# Patient Record
Sex: Female | Born: 1950 | Race: Black or African American | Hispanic: No | Marital: Single | Smoking: Former smoker
Health system: Southern US, Community
[De-identification: ages and names within clinical notes are randomized; demographics above are authoritative.]

## PROBLEM LIST (undated history)

## (undated) DIAGNOSIS — I10 Essential (primary) hypertension: Secondary | ICD-10-CM

## (undated) DIAGNOSIS — M1990 Unspecified osteoarthritis, unspecified site: Secondary | ICD-10-CM

## (undated) DIAGNOSIS — M109 Gout, unspecified: Secondary | ICD-10-CM

## (undated) DIAGNOSIS — R0600 Dyspnea, unspecified: Secondary | ICD-10-CM

## (undated) DIAGNOSIS — E119 Type 2 diabetes mellitus without complications: Secondary | ICD-10-CM

## (undated) DIAGNOSIS — I200 Unstable angina: Secondary | ICD-10-CM

## (undated) DIAGNOSIS — R011 Cardiac murmur, unspecified: Secondary | ICD-10-CM

## (undated) DIAGNOSIS — G5600 Carpal tunnel syndrome, unspecified upper limb: Secondary | ICD-10-CM

## (undated) HISTORY — PX: TUBAL LIGATION: SHX77

## (undated) HISTORY — PX: APPENDECTOMY: SHX54

---

## 2004-09-09 ENCOUNTER — Ambulatory Visit: Payer: Self-pay | Admitting: Obstetrics & Gynecology

## 2004-09-17 ENCOUNTER — Ambulatory Visit (HOSPITAL_COMMUNITY): Admission: RE | Admit: 2004-09-17 | Discharge: 2004-09-17 | Payer: Self-pay | Admitting: *Deleted

## 2004-10-21 ENCOUNTER — Ambulatory Visit (HOSPITAL_COMMUNITY): Admission: RE | Admit: 2004-10-21 | Discharge: 2004-10-21 | Payer: Self-pay | Admitting: *Deleted

## 2004-10-21 ENCOUNTER — Ambulatory Visit: Payer: Self-pay | Admitting: Obstetrics and Gynecology

## 2004-12-15 ENCOUNTER — Ambulatory Visit: Payer: Self-pay | Admitting: Obstetrics and Gynecology

## 2004-12-15 ENCOUNTER — Ambulatory Visit (HOSPITAL_COMMUNITY): Admission: RE | Admit: 2004-12-15 | Discharge: 2004-12-15 | Payer: Self-pay | Admitting: Obstetrics and Gynecology

## 2004-12-15 ENCOUNTER — Encounter (INDEPENDENT_AMBULATORY_CARE_PROVIDER_SITE_OTHER): Payer: Self-pay | Admitting: *Deleted

## 2005-01-01 ENCOUNTER — Ambulatory Visit: Payer: Self-pay | Admitting: Obstetrics and Gynecology

## 2009-02-05 ENCOUNTER — Emergency Department (HOSPITAL_COMMUNITY): Admission: EM | Admit: 2009-02-05 | Discharge: 2009-02-05 | Payer: Self-pay | Admitting: Family Medicine

## 2009-03-31 ENCOUNTER — Emergency Department (HOSPITAL_COMMUNITY): Admission: EM | Admit: 2009-03-31 | Discharge: 2009-03-31 | Payer: Self-pay | Admitting: Family Medicine

## 2009-04-22 ENCOUNTER — Ambulatory Visit: Payer: Self-pay | Admitting: Family Medicine

## 2009-04-22 DIAGNOSIS — K044 Acute apical periodontitis of pulpal origin: Secondary | ICD-10-CM | POA: Insufficient documentation

## 2009-04-22 DIAGNOSIS — E669 Obesity, unspecified: Secondary | ICD-10-CM | POA: Insufficient documentation

## 2009-04-22 DIAGNOSIS — I10 Essential (primary) hypertension: Secondary | ICD-10-CM | POA: Insufficient documentation

## 2009-04-22 DIAGNOSIS — M1990 Unspecified osteoarthritis, unspecified site: Secondary | ICD-10-CM

## 2009-04-22 DIAGNOSIS — E119 Type 2 diabetes mellitus without complications: Secondary | ICD-10-CM

## 2009-04-22 LAB — CONVERTED CEMR LAB
BUN: 9 mg/dL (ref 6–23)
Basophils Absolute: 0 10*3/uL (ref 0.0–0.1)
Basophils Relative: 0 % (ref 0–1)
Bilirubin Urine: NEGATIVE
Blood in Urine, dipstick: NEGATIVE
Calcium: 9.4 mg/dL (ref 8.4–10.5)
Cholesterol: 208 mg/dL — ABNORMAL HIGH (ref 0–200)
Eosinophils Relative: 1 % (ref 0–5)
Glucose, Urine, Semiquant: NEGATIVE
Hgb A1c MFr Bld: 6.5 %
Lymphocytes Relative: 33 % (ref 12–46)
Monocytes Relative: 8 % (ref 3–12)
Potassium: 4.5 meq/L (ref 3.5–5.3)
Protein, U semiquant: NEGATIVE
RBC: 4.28 M/uL (ref 3.87–5.11)
RDW: 14.6 % (ref 11.5–15.5)
Rapid HIV Screen: NEGATIVE
Sodium: 142 meq/L (ref 135–145)
Specific Gravity, Urine: 1.015
Total CHOL/HDL Ratio: 4.1
Triglycerides: 120 mg/dL (ref <150)
Urobilinogen, UA: 0.2
VLDL: 24 mg/dL (ref 0–40)
WBC: 8 10*3/uL (ref 4.0–10.5)

## 2009-04-23 ENCOUNTER — Encounter (INDEPENDENT_AMBULATORY_CARE_PROVIDER_SITE_OTHER): Payer: Self-pay | Admitting: Family Medicine

## 2009-04-29 ENCOUNTER — Encounter (INDEPENDENT_AMBULATORY_CARE_PROVIDER_SITE_OTHER): Payer: Self-pay | Admitting: Family Medicine

## 2009-05-06 ENCOUNTER — Ambulatory Visit: Payer: Self-pay | Admitting: Family Medicine

## 2009-05-06 DIAGNOSIS — E559 Vitamin D deficiency, unspecified: Secondary | ICD-10-CM

## 2009-05-06 DIAGNOSIS — R011 Cardiac murmur, unspecified: Secondary | ICD-10-CM

## 2009-05-06 LAB — CONVERTED CEMR LAB
Bilirubin Urine: NEGATIVE
Blood in Urine, dipstick: NEGATIVE
Glucose, Urine, Semiquant: NEGATIVE
Microalb, Ur: 1 mg/dL (ref 0.00–1.89)
Specific Gravity, Urine: 1.02
Urobilinogen, UA: 0.2
pH: 5

## 2009-05-07 ENCOUNTER — Encounter (INDEPENDENT_AMBULATORY_CARE_PROVIDER_SITE_OTHER): Payer: Self-pay | Admitting: Family Medicine

## 2009-05-20 ENCOUNTER — Ambulatory Visit: Payer: Self-pay | Admitting: Family Medicine

## 2009-05-20 DIAGNOSIS — M109 Gout, unspecified: Secondary | ICD-10-CM | POA: Insufficient documentation

## 2009-05-20 DIAGNOSIS — N841 Polyp of cervix uteri: Secondary | ICD-10-CM | POA: Insufficient documentation

## 2009-05-21 ENCOUNTER — Encounter (INDEPENDENT_AMBULATORY_CARE_PROVIDER_SITE_OTHER): Payer: Self-pay | Admitting: Family Medicine

## 2009-10-23 ENCOUNTER — Emergency Department (HOSPITAL_COMMUNITY): Admission: EM | Admit: 2009-10-23 | Discharge: 2009-10-23 | Payer: Self-pay | Admitting: Family Medicine

## 2009-12-31 ENCOUNTER — Ambulatory Visit: Payer: Self-pay | Admitting: Internal Medicine

## 2010-07-06 LAB — CONVERTED CEMR LAB
Blood Glucose, Fingerstick: 180
CO2: 21 meq/L (ref 19–32)
Glucose, Urine, Semiquant: NEGATIVE
Sodium: 139 meq/L (ref 135–145)
Urobilinogen, UA: 0.2
pH: 5

## 2010-07-09 NOTE — Letter (Signed)
Summary: TEST ORDER FORM//T-ECHO TRANSTHORACIC//APPT DATE & TIME  TEST ORDER FORM//T-ECHO TRANSTHORACIC//APPT DATE & TIME   Imported By: Arta Bruce 07/05/2009 11:03:22  _____________________________________________________________________  External Attachment:    Type:   Image     Comment:   External Document

## 2010-07-09 NOTE — Letter (Signed)
Summary: DENTAL REFERRAL  DENTAL REFERRAL   Imported By: Arta Bruce 06/20/2009 15:51:59  _____________________________________________________________________  External Attachment:    Type:   Image     Comment:   External Document

## 2010-07-09 NOTE — Letter (Signed)
Summary: GYNECOLOGIC CYTOLOGY REPORT  GYNECOLOGIC CYTOLOGY REPORT   Imported By: Arta Bruce 07/22/2009 16:56:13  _____________________________________________________________________  External Attachment:    Type:   Image     Comment:   External Document

## 2010-10-24 NOTE — Group Therapy Note (Signed)
NAME:  Desiree Sanchez, Desiree Sanchez NO.:  [id number]   MEDICAL RECORD NO.:  [id number]          PATIENT TYPE:  WOC   LOCATION:  [facility]                   FACILITY:  [facility]   PHYSICIAN:  Argentina Donovan, MD        DATE OF BIRTH:  August 05, 1950   DATE OF SERVICE:  09/09/2004                                    CLINIC NOTE   CHIEF COMPLAINT:  New patient for Pap smear as well as postmenopausal  bleeding and vaginal discharge.   HISTORY OF PRESENT ILLNESS:  The patient reports that she went through  menopause in approximately 1995 and has had no episodes of bleeding since.  However, she had what she describes as a menstrual period in December of  2005 that lasted for approximately five days.  She also noted some tissue  down in her vaginal introitus at that time but that resolved upon resolution  of the vaginal bleeding.  She also notes that she has occasional vaginal  odor, especially after this episode of bleeding.  She uses some Norform  suppositories and these help.  She is not currently sexually active.   PAST MEDICAL HISTORY:  Heart disease.   PAST MENSTRUAL HISTORY:  She began her menarche at age 46.  She did have  heavy periods.  She went through menopause in 1995 and had this episode of  menstrual bleeding in December of 2005.   PAST OBSTETRICAL HISTORY:  She is G4, P3.  Her last Pap smear was in 2004.  She has never had an abnormal Pap smear.  Her mammogram was in 2000.   PAST SURGICAL HISTORY:  She had an appendectomy in 1965.   FAMILY HISTORY:  Significant for mother and sister with diabetes type 2 and  myocardial infarction in mother.   SOCIAL HISTORY:  She lives alone.  Denies tobacco, alcohol, and drugs.   REVIEW OF SYSTEMS:  Negative for bruising.  Positive for numbness in  fingers.  Negative for swelling, muscle aches, fevers, fatigue, weight loss,  weight gain.  Negative for headaches.  Negative for dizzy spells.  Negative  for problems with hearing, smell, or  vision.  Negative for hemoptysis.  Negative for shortness of breath or chest pain.  Negative for nausea,  vomiting, hematochezia, dysuria.  Negative for urinary incontinence.  Negative for hot flashes.  Positive for vaginal odor and vaginal bleeding.  Negative for vaginal itching or pain with intercourse.   PHYSICAL EXAMINATION:  VITAL SIGNS:  Temperature 97.0, pulse 61, blood  pressure 149/86, weight today is 255.4.  GENERAL:  This is an obese African-American female in no acute distress.  CARDIOVASCULAR:  Regular rate and rhythm.  No murmurs, rubs, or gallops  noted.  LUNGS:  Clear to auscultation bilaterally.  ABDOMEN:  Obese, soft, nontender, nondistended with normoactive bowel  sounds.  PELVIC:  She has normal external female genitalia.  She does appear to have  some early changes of atrophic vaginitis.  Her cervix is without lesion,  although there is a small polypoid type protrusion from the external os.  There is no discharge from the external os.  A wet prep is collected as well  as a Pap smear.  Bimanual examination shows no cervical motion tenderness,  no adnexal tenderness or enlargement.   ASSESSMENT/PLAN:  1.  Postmenopausal bleeding.  The cause of this bleeding is unclear.  She      has only had one episode but coupled with the tissue that she noted      during the bleeding as well as the tissue noted on her speculum      examination, seems prudent to further evaluate this.  At this time we      will follow up on the Pap smear and also obtain an ultrasound to assess      her endometrial stripe.  The patient will follow up in one month's time      for follow-up of her postmenopausal bleeding.  2.  Health care maintenance.  The patient will also be scheduled for a      mammogram on April 12.  She is agreeable to this plan of care and will      return to the clinic in one month's time.      JT/MEDQ  D:  09/09/2004  T:  09/10/2004  Job:  [id number]

## 2010-10-24 NOTE — Op Note (Signed)
NAME:  Desiree Sanchez, Desiree Sanchez               ACCOUNT NO.:  [id number]   MEDICAL RECORD NO.:  [id number]          PATIENT TYPE:  AMB   LOCATION:  [facility]                           FACILITY:  [facility]   PHYSICIAN:  Phil D. Okey Dupre, M.D.     DATE OF BIRTH:  1950/06/23   DATE OF PROCEDURE:  12/15/2004  DATE OF DISCHARGE:                                 OPERATIVE REPORT   PROCEDURE:  Dilatation, curettage and uterine polypectomy.   PREOPERATIVE DIAGNOSIS:  Postmenopausal bleeding with uterine polyp.   POSTOPERATIVE DIAGNOSIS:  Postmenopausal bleeding with uterine polyp.   SURGEON:  Javier Glazier. Okey Dupre, M.D.   ANESTHESIA:  MAC plus local.   ESTIMATED BLOOD LOSS:  Less than 10 mL.   POSTOPERATIVE CONDITION:  Satisfactory.   SPECIMENS TO PATHOLOGY:  Uterine polyp plus uterine curetting's.   DESCRIPTION OF PROCEDURE:  Under satisfactory MAC analgesia with the patient  in the dorsal lithotomy position, the perineum and vagina was prepped and  draped in the usual sterile manner. Bimanual pelvic anesthesia under  anesthesia revealed the uterus upper limits of normal and first degree  retroversion, freely moveable with normal free adnexa, a cervix that was in  first degree prolapse with __________ that was clean and well rugated.  External genitalia was normal. A weighted speculum was placed in the  posterior fourchette of the vagina, anterior lip of the cervix grasped with  a single tooth tenaculum. The uterine cavity sounded to a depth of 8 cm, the  cervical os dilated to a #8 Hegar dilator. A cervical polyp that was easily  seen at the external cervical os was twisted off with polyp forceps and the  base curetted with a small serrated curette. The uterine cavity was then  curetted with a small serrated curette followed by a small smooth curette  and the uterine cavity had very little endometrium in the cavity obtainable.  The specimen was sent for pathological diagnosis. Prior to this, the  paracervical areas were  injected each side at 8 and 4 o'clock with 10 mL  each of 1% Xylocaine to give the patient additional comfort. The tenaculum  and speculum removed from the vagina and the patient was transferred to the  recovery room in satisfactory condition having tolerated the procedure well.       PDR/MEDQ  D:  12/15/2004  T:  12/15/2004  Job:  [id number]

## 2012-03-21 ENCOUNTER — Other Ambulatory Visit (HOSPITAL_COMMUNITY): Payer: Self-pay | Admitting: Internal Medicine

## 2012-03-21 DIAGNOSIS — Z1231 Encounter for screening mammogram for malignant neoplasm of breast: Secondary | ICD-10-CM

## 2012-04-25 ENCOUNTER — Ambulatory Visit (HOSPITAL_COMMUNITY): Payer: Self-pay

## 2012-05-12 ENCOUNTER — Ambulatory Visit (HOSPITAL_COMMUNITY)
Admission: RE | Admit: 2012-05-12 | Discharge: 2012-05-12 | Disposition: A | Discharge status: Home / Self Care | Payer: Self-pay | Source: Ambulatory Visit | Attending: Internal Medicine | Admitting: Internal Medicine

## 2012-05-12 DIAGNOSIS — Z1231 Encounter for screening mammogram for malignant neoplasm of breast: Secondary | ICD-10-CM | POA: Insufficient documentation

## 2012-06-02 ENCOUNTER — Emergency Department (HOSPITAL_COMMUNITY)
Admission: EM | Admit: 2012-06-02 | Discharge: 2012-06-02 | Disposition: A | Discharge status: Home / Self Care | Payer: Self-pay | Attending: Emergency Medicine | Admitting: Emergency Medicine

## 2012-06-02 ENCOUNTER — Encounter (HOSPITAL_COMMUNITY): Payer: Self-pay | Admitting: Emergency Medicine

## 2012-06-02 DIAGNOSIS — M1990 Unspecified osteoarthritis, unspecified site: Secondary | ICD-10-CM

## 2012-06-02 DIAGNOSIS — I10 Essential (primary) hypertension: Secondary | ICD-10-CM | POA: Insufficient documentation

## 2012-06-02 DIAGNOSIS — Z87891 Personal history of nicotine dependence: Secondary | ICD-10-CM | POA: Insufficient documentation

## 2012-06-02 DIAGNOSIS — Z79899 Other long term (current) drug therapy: Secondary | ICD-10-CM | POA: Insufficient documentation

## 2012-06-02 DIAGNOSIS — M129 Arthropathy, unspecified: Secondary | ICD-10-CM | POA: Insufficient documentation

## 2012-06-02 DIAGNOSIS — E119 Type 2 diabetes mellitus without complications: Secondary | ICD-10-CM | POA: Insufficient documentation

## 2012-06-02 HISTORY — DX: Type 2 diabetes mellitus without complications: E11.9

## 2012-06-02 HISTORY — DX: Essential (primary) hypertension: I10

## 2012-06-02 HISTORY — DX: Unspecified osteoarthritis, unspecified site: M19.90

## 2012-06-02 MED ORDER — TRAMADOL HCL 50 MG PO TABS: 50.0000 mg | ORAL_TABLET | Freq: Four times a day (QID) | ORAL | Status: DC | PRN

## 2012-06-02 NOTE — ED Notes (Signed)
PJ  With case management gave pt flyer with info regarding [facility] clinic hours at [facility].

## 2012-06-02 NOTE — ED Notes (Signed)
Pt c/o bilateral knee and leg pain. Pt states she has arthritis and she was being seen at [facility] and she has been unable to get her medication refilled since they closed down. Rates pain 9/10.

## 2012-06-02 NOTE — ED Provider Notes (Signed)
History  This chart was scribed for Loren Racer, MD by Bennett Scrape, ED Scribe. This patient was seen in room TR07C/TR07C and the patient's care was started at 11:05 AM.  CSN: [id number]  Arrival date & time 06/02/12  1010   First MD Initiated Contact with Patient 06/02/12 1105      Chief Complaint  Patient presents with  . Leg Pain    Patient is a 61 y.o. female presenting with leg pain. The history is provided by the patient. No language interpreter was used.  Leg Pain  Incident onset: chronic, arthritis. There was no injury mechanism. The pain is present in the left hip, right hip, right knee and left knee. The pain is at a severity of 9/10. The pain has been constant since onset. The symptoms are aggravated by activity. She has tried acetaminophen for the symptoms. The treatment provided no relief.    Annamay Laymon is a 61 y.o. female with a h/o arthritis who presents to the Emergency Department complaining of bilateral knee and hip pain that she attributes to arthritis. She states that she was being seen at [facility] and has been unable to get her Tramadol refilled since they closed. She denies being on antiinflammatories currently. She reports that acetaminophen does not improve her pain and ibuprofen makes her nauseous. She states that her insurance expired and she has been unable to find a PCP. She denies having any other symptoms or illnesses currently. She has a h/o HTN and DM. Shet is a former smoker but denies alcohol use.  Past Medical History  Diagnosis Date  . Hypertension   . Diabetes mellitus without complication   . Arthritis     Past Surgical History  Procedure Date  . Appendectomy     History reviewed. No pertinent family history.  History  Substance Use Topics  . Smoking status: Former Games developer  . Smokeless tobacco: Never Used  . Alcohol Use: No    No OB history provided.  Review of Systems  Constitutional: Negative for fever and chills.    HENT: Negative for congestion and sore throat.   Musculoskeletal: Positive for arthralgias. Negative for back pain.       Positive for chronic bilateral hip pain and knee pain  All other systems reviewed and are negative.    Allergies  Lisinopril  Home Medications   Current Outpatient Rx  Name  Route  Sig  Dispense  Refill  . ATENOLOL 25 MG PO TABS   Oral   Take 25 mg by mouth daily.         Marland Kitchen HYDROCHLOROTHIAZIDE 25 MG PO TABS   Oral   Take 25 mg by mouth daily.         Marland Kitchen METFORMIN HCL 500 MG PO TABS   Oral   Take 500 mg by mouth 2 (two) times daily with a meal.         . TRAMADOL HCL 50 MG PO TABS   Oral   Take 1 tablet (50 mg total) by mouth every 6 (six) hours as needed for pain.   30 tablet   0     Triage Vitals: BP 165/85  Pulse 61  Temp 98 F (36.7 C) (Oral)  Resp 18  SpO2 100%  Physical Exam  Nursing note and vitals reviewed. Constitutional: She is oriented to person, place, and time. She appears well-developed and well-nourished. No distress.  HENT:  Head: Normocephalic and atraumatic.  Eyes: EOM are normal.  Neck:  Neck supple. No tracheal deviation present.  Cardiovascular: Normal rate.   Pulmonary/Chest: Effort normal. No respiratory distress.  Musculoskeletal: Normal range of motion.       Full ROM in bilateral hips and knees, pulses are intact bilaterally, ambulatory  Neurological: She is alert and oriented to person, place, and time.  Skin: Skin is warm and dry.  Psychiatric: She has a normal mood and affect. Her behavior is normal.    ED Course  Procedures (including critical care time)  DIAGNOSTIC STUDIES: Oxygen Saturation is 100% on room air, normal by my interpretation.    COORDINATION OF CARE: 11:15 AM Discussed discharge plan which includes ultram with pt and pt agreed to plan. Also advised pt that she will have to find a PCP to follow up with and pt agreed.  11:30 AM- Prescribed 50 mg ultram tablets  Labs Reviewed - No  data to display No results found.   1. Arthritis       MDM  I personally performed the services described in this documentation, which was scribed in my presence. The recorded information has been reviewed and is accurate.    Loren Racer, MD 06/02/12 1946

## 2012-06-02 NOTE — ED Notes (Signed)
Pt c/o of right knee pain and and bilateral inner thigh pain. States she has been told in the past that it was "arthritis". Out of Tramadol x 1 month. Unable to get PCP at this time. No injury reported.

## 2012-06-02 NOTE — ED Notes (Signed)
Case Manager requested to consult with pt.

## 2013-03-13 ENCOUNTER — Encounter: Payer: Self-pay | Admitting: Internal Medicine

## 2013-03-13 ENCOUNTER — Ambulatory Visit: Payer: No Typology Code available for payment source | Attending: Internal Medicine | Admitting: Internal Medicine

## 2013-03-13 ENCOUNTER — Ambulatory Visit: Payer: No Typology Code available for payment source

## 2013-03-13 VITALS — BP 145/82 | HR 60 | Temp 98.0°F | Resp 16 | Wt 240.6 lb

## 2013-03-13 DIAGNOSIS — M1710 Unilateral primary osteoarthritis, unspecified knee: Secondary | ICD-10-CM

## 2013-03-13 DIAGNOSIS — E669 Obesity, unspecified: Secondary | ICD-10-CM | POA: Insufficient documentation

## 2013-03-13 DIAGNOSIS — Z23 Encounter for immunization: Secondary | ICD-10-CM

## 2013-03-13 DIAGNOSIS — I10 Essential (primary) hypertension: Secondary | ICD-10-CM

## 2013-03-13 DIAGNOSIS — E1310 Other specified diabetes mellitus with ketoacidosis without coma: Secondary | ICD-10-CM

## 2013-03-13 DIAGNOSIS — E119 Type 2 diabetes mellitus without complications: Secondary | ICD-10-CM

## 2013-03-13 DIAGNOSIS — E1110 Type 2 diabetes mellitus with ketoacidosis without coma: Secondary | ICD-10-CM

## 2013-03-13 DIAGNOSIS — Z0000 Encounter for general adult medical examination without abnormal findings: Secondary | ICD-10-CM

## 2013-03-13 DIAGNOSIS — M170 Bilateral primary osteoarthritis of knee: Secondary | ICD-10-CM

## 2013-03-13 LAB — POCT GLYCOSYLATED HEMOGLOBIN (HGB A1C): Hemoglobin A1C: 6.2

## 2013-03-13 MED ORDER — HYDROCHLOROTHIAZIDE 25 MG PO TABS: 25.0000 mg | ORAL_TABLET | Freq: Every day | ORAL | Status: DC

## 2013-03-13 MED ORDER — METFORMIN HCL 500 MG PO TABS: 500.0000 mg | ORAL_TABLET | Freq: Two times a day (BID) | ORAL | Status: DC

## 2013-03-13 MED ORDER — ATENOLOL 25 MG PO TABS: 25.0000 mg | ORAL_TABLET | Freq: Every day | ORAL | Status: DC

## 2013-03-13 MED ORDER — INDOMETHACIN 50 MG PO CAPS: 50.0000 mg | ORAL_CAPSULE | Freq: Two times a day (BID) | ORAL | Status: DC | PRN

## 2013-03-13 NOTE — Addendum Note (Signed)
Addended by: Lucy Antigua on: 03/13/2013 02:50 PM   Modules accepted: Orders

## 2013-03-13 NOTE — Progress Notes (Signed)
Patient here to establish care Has history of DM and HTN 

## 2013-03-13 NOTE — Patient Instructions (Signed)

## 2013-03-13 NOTE — Progress Notes (Signed)
Patient ID: Desiree Sanchez, female   DOB: Feb 09, 1951, 62 y.o.   MRN: [id number]  CC:  Knee pain  HPI: [name] is a 62 year old woman with a PMH of hypertension, type 2 diabetes and osteoarthritis. She was last seen by a physician about one year ago. She needs medication refills today on all of her usual medications. Patient states she checks her sugars about one time per month and that her readings are usually in the 100-120 range. She does have some bilateral knee pain for which he takes Indocin and ibuprofen as needed. She was taking Ultram in the past, but this causes her to itch so she discontinued this medication. A hemoglobin A1c was done during this visit and is pending. She has no recent blood work to review.  Allergies  Allergen Reactions  . Lisinopril     REACTION: Swolen lips (only)   Past Medical History  Diagnosis Date  . Hypertension   . Diabetes mellitus without complication   . Arthritis    Current Outpatient Prescriptions on File Prior to Visit  Medication Sig Dispense Refill  . atenolol (TENORMIN) 25 MG tablet Take 25 mg by mouth daily.      . hydrochlorothiazide (HYDRODIURIL) 25 MG tablet Take 25 mg by mouth daily.      . metFORMIN (GLUCOPHAGE) 500 MG tablet Take 500 mg by mouth 2 (two) times daily with a meal.      . traMADol (ULTRAM) 50 MG tablet Take 1 tablet (50 mg total) by mouth every 6 (six) hours as needed for pain.  30 tablet  0   No current facility-administered medications on file prior to visit.   History reviewed. No pertinent family history. History   Social History  . Marital Status: Single    Spouse Name: N/A    Number of Children: N/A  . Years of Education: N/A   Occupational History  . Not on file.   Social History Main Topics  . Smoking status: Former Games developer  . Smokeless tobacco: Never Used  . Alcohol Use: No  . Drug Use: No  . Sexual Activity:    Other Topics Concern  . Not on file   Social History Narrative  . No narrative on  file    Review of Systems: Constitutional: No fever, no chills;  Appetite normal; No weight loss. HEENT: No blurry vision, no diplopia, no pharyngitis, no dysphagia CV: No chest pain, no palpitations.  Resp: No SOB, no cough. GI: No N/V, no diarrhea, no melena, no hematochezia.  GU: No dysuria, hematuria, no frequency, no hesitancy.  MSK: + shoulder/knee myalgias/arthralgias.  Neuro:  No headache, no focal neurological deficits.  Psych: No depression, no anxiety.  Endo: No heat intolerance, no cold intolerance, no excessive thirst, no excessive urination.  Skin: No rashes, no skin lesions.  Heme: No fatigue, no easy bruising   Objective:   Filed Vitals:   03/13/13 1222  BP: 145/82  Pulse: 60  Temp: 98 F (36.7 C)  Resp: 16    Physical Exam  Constitutional: Appears well-developed and well-nourished. No distress.  HENT: Normocephalic. External right and left ear normal. Oropharynx is clear and moist.  Eyes: Conjunctivae and EOM are normal. PERRLA, no scleral icterus.  Neck: Normal ROM. Neck supple. No JVD. No tracheal deviation. No thyromegaly.  CVS: RRR, S1/S2 +, no murmurs, no gallops, no carotid bruit.  Pulmonary: Effort and breath sounds normal, no stridor, rhonchi, wheezes, rales.  Abdominal: Soft. BS +,  no distension, tenderness,  rebound or guarding. Musculoskeletal: Normal range of motion. No edema and no tenderness.  Neuro: Alert. Normal reflexes, muscle tone coordination. No cranial nerve deficit. Skin: Skin is warm and dry. No rash noted. Not diaphoretic. No erythema. No pallor.  Psychiatric: Normal mood and affect. Behavior, judgment, thought content normal.   Lab Results  Component Value Date   WBC 8.0 04/22/2009   HGB 12.4 04/22/2009   HCT 38.5 04/22/2009   MCV 90.0 04/22/2009   PLT 334 04/22/2009   Lab Results  Component Value Date   CREATININE 1.38* 05/20/2009   BUN 20 05/20/2009   NA 139 05/20/2009   K 4.5 05/20/2009   CL 102 05/20/2009   CO2 21 05/20/2009     Lab Results  Component Value Date   HGBA1C 6.5 04/22/2009   Lipid Panel     Component Value Date/Time   CHOL 208* 04/22/2009 2257   TRIG 120 04/22/2009 2257   HDL 51 04/22/2009 2257   CHOLHDL 4.1 Ratio 04/22/2009 2257   VLDL 24 04/22/2009 2257   LDLCALC 133* 04/22/2009 2257       Assessment and plan:  1. Type 2 diabetes: Followup hemoglobin A1c. Refill metformin. Check microalbumin/creatinine ratio given history of elevated creatinine in the past. 2. Hypertension: Reasonable blood pressure control on hydrochlorothiazide and atenolol which was refilled. 3. Knee osteoarthritis: Continue indomethacin and as needed ibuprofen. If her creatinine levels are elevated, may need to limit NSAID use. 4. Obesity: Patient was given written handout instructions on diet/exercise tips.  Routine Health Maintenance   Ophthalmology Exam: Referral made 03/13/2013.  Colon Cancer Screening annually 50-75 with stool cards/Colonoscopy Q 10 years: Referral made 03/13/2013.  Lipid Screening Q 5 years: Scheduled 03/20/2013.  Mammogram annually in women > 40: Due 05/2013.  PAP annually 21-30, Q 3 years > 30: Reports normal exam one year ago. Recommend rescreening in 2016.  Diabetic foot exam: 03/13/2013.  Flu vaccine: 03/13/2013.  Return to the clinic: For blood work next week and routine followup visit in 3 months.  Signed:  Dr. Trula Ore Meko Masterson 03/13/2013 12:25 PM

## 2013-03-20 ENCOUNTER — Other Ambulatory Visit: Payer: No Typology Code available for payment source

## 2013-03-21 ENCOUNTER — Other Ambulatory Visit: Payer: Self-pay | Admitting: Internal Medicine

## 2013-03-21 MED ORDER — INDOMETHACIN 50 MG PO CAPS: 50.0000 mg | ORAL_CAPSULE | Freq: Two times a day (BID) | ORAL | Status: DC | PRN

## 2013-03-21 MED ORDER — HYDROCHLOROTHIAZIDE 25 MG PO TABS: 25.0000 mg | ORAL_TABLET | Freq: Every day | ORAL | Status: DC

## 2013-03-21 MED ORDER — METFORMIN HCL 500 MG PO TABS: 500.0000 mg | ORAL_TABLET | Freq: Two times a day (BID) | ORAL | Status: DC

## 2013-03-21 MED ORDER — ATENOLOL 25 MG PO TABS: 25.0000 mg | ORAL_TABLET | Freq: Every day | ORAL | Status: DC

## 2013-03-21 NOTE — Progress Notes (Signed)
Medication has been re-prescribed.

## 2013-03-21 NOTE — Progress Notes (Signed)
Re-prescribed.

## 2013-04-08 ENCOUNTER — Emergency Department (HOSPITAL_COMMUNITY)
Admission: EM | Admit: 2013-04-08 | Discharge: 2013-04-08 | Disposition: A | Discharge status: Home / Self Care | Payer: No Typology Code available for payment source | Attending: Emergency Medicine | Admitting: Emergency Medicine

## 2013-04-08 ENCOUNTER — Emergency Department (HOSPITAL_COMMUNITY): Payer: No Typology Code available for payment source

## 2013-04-08 ENCOUNTER — Encounter (HOSPITAL_COMMUNITY): Payer: Self-pay | Admitting: Emergency Medicine

## 2013-04-08 DIAGNOSIS — I10 Essential (primary) hypertension: Secondary | ICD-10-CM | POA: Insufficient documentation

## 2013-04-08 DIAGNOSIS — E119 Type 2 diabetes mellitus without complications: Secondary | ICD-10-CM | POA: Insufficient documentation

## 2013-04-08 DIAGNOSIS — Z79899 Other long term (current) drug therapy: Secondary | ICD-10-CM | POA: Insufficient documentation

## 2013-04-08 DIAGNOSIS — J209 Acute bronchitis, unspecified: Secondary | ICD-10-CM | POA: Insufficient documentation

## 2013-04-08 DIAGNOSIS — Z87891 Personal history of nicotine dependence: Secondary | ICD-10-CM | POA: Insufficient documentation

## 2013-04-08 DIAGNOSIS — M129 Arthropathy, unspecified: Secondary | ICD-10-CM | POA: Insufficient documentation

## 2013-04-08 DIAGNOSIS — J3489 Other specified disorders of nose and nasal sinuses: Secondary | ICD-10-CM | POA: Insufficient documentation

## 2013-04-08 MED ORDER — ALBUTEROL SULFATE HFA 108 (90 BASE) MCG/ACT IN AERS
4.0000 | INHALATION_SPRAY | Freq: Once | RESPIRATORY_TRACT | Status: AC
  Administered 2013-04-08: 4 via RESPIRATORY_TRACT
  Filled 2013-04-08: qty 6.7

## 2013-04-08 NOTE — ED Notes (Signed)
Pt educated on use of inhaler and demonstrated use.

## 2013-04-08 NOTE — ED Notes (Addendum)
REVIEWED INHALER USE WITH PATIENT. SHE VERBALIZES UNDERSTANDING. WILL RETURN FOR NEW OR WORSENING SYMPTOMS. STATES SHE IS FEELING BETTER AFTER INHALER.

## 2013-04-08 NOTE — ED Notes (Signed)
Reports frequent cough for a week. Denies fever. Denies body aches. States started off as a sore throat and cold symptoms

## 2013-04-08 NOTE — ED Notes (Signed)
Pt has returned from xray

## 2013-04-08 NOTE — ED Provider Notes (Signed)
CSN: [id number]     Arrival date & time 04/08/13  7829 History   First MD Initiated Contact with Patient 04/08/13 0701     Chief Complaint  Patient presents with  . Cough   (Consider location/radiation/quality/duration/timing/severity/associated sxs/prior Treatment) HPI Comments: 62 year old female presents with a low over a week of productive cough. She states she also feels "congested in her chest". She denies any rhinorrhea or nasal congestion. No fevers or chills. She's not feel short of breath, she has prolonged coughing around. She states that the coughing gets bad she gets lobe chest tightness but otherwise has not had any chest pain. She's not have any exertional chest pain or dyspnea. She had a 20 year history of smoking but stopped 3 years ago. He is a she has heard some wheezing during bad coughing spells.   Past Medical History  Diagnosis Date  . Hypertension   . Diabetes mellitus without complication   . Arthritis    Past Surgical History  Procedure Laterality Date  . Appendectomy     No family history on file. History  Substance Use Topics  . Smoking status: Former Games developer  . Smokeless tobacco: Never Used  . Alcohol Use: No   OB History   Grav Para Term Preterm Abortions TAB SAB Ect Mult Living                 Review of Systems  Constitutional: Negative for fever and chills.  HENT: Negative for congestion and rhinorrhea.   Respiratory: Positive for cough, chest tightness and wheezing. Negative for shortness of breath.   Cardiovascular: Negative for chest pain.  Gastrointestinal: Negative for vomiting.  All other systems reviewed and are negative.    Allergies  Lisinopril and Naproxen  Home Medications   Current Outpatient Rx  Name  Route  Sig  Dispense  Refill  . atenolol (TENORMIN) 25 MG tablet   Oral   Take 1 tablet (25 mg total) by mouth daily.   30 tablet   6   . hydrochlorothiazide (HYDRODIURIL) 25 MG tablet   Oral   Take 1 tablet (25 mg  total) by mouth daily.   30 tablet   6   . ibuprofen (ADVIL,MOTRIN) 200 MG tablet   Oral   Take 400-600 mg by mouth every 6 (six) hours as needed for pain.         . metFORMIN (GLUCOPHAGE) 500 MG tablet   Oral   Take 1 tablet (500 mg total) by mouth 2 (two) times daily with a meal.   60 tablet   6    BP 140/61  Pulse 66  Temp(Src) 98.4 F (36.9 C) (Oral)  Resp 18  SpO2 97% Physical Exam  Nursing note and vitals reviewed. Constitutional: She is oriented to person, place, and time. She appears well-developed and well-nourished. No distress.  HENT:  Head: Normocephalic and atraumatic.  Right Ear: External ear normal.  Left Ear: External ear normal.  Nose: Nose normal.  Mouth/Throat: No posterior oropharyngeal erythema.  Eyes: Right eye exhibits no discharge. Left eye exhibits no discharge.  Cardiovascular: Normal rate, regular rhythm and normal heart sounds.   Pulmonary/Chest: Effort normal. She has wheezes.  Mild wheezes auscultated, otherwise has good air movement  Abdominal: Soft. There is no tenderness.  Neurological: She is alert and oriented to person, place, and time.  Skin: Skin is warm and dry.    ED Course  Procedures (including critical care time) Labs Review Labs Reviewed - No data  to display Imaging Review Dg Chest 2 View  04/08/2013   CLINICAL DATA:  Initial encounter for 2 day history of cough and wheezing. Current history of diabetes and hypertension.  EXAM: CHEST  2 VIEW  COMPARISON:  None.  FINDINGS: Cardiomediastinal silhouette unremarkable. Lungs clear. Bronchovascular markings normal. Pulmonary vascularity normal. No visible pleural effusions. No pneumothorax. Mild eventration of right anterior hemidiaphragm. Degenerative changes involving the mid thoracic spine.  IMPRESSION: No acute cardiopulmonary disease.   Electronically Signed   By: Hulan Saas M.D.   On: 04/08/2013 07:37    EKG Interpretation     Ventricular Rate:  69 PR  Interval:  171 QRS Duration: 87 QT Interval:  431 QTC Calculation: 462 R Axis:   7 Text Interpretation:  Sinus rhythm Low voltage, precordial leads Abnormal R-wave progression, early transition Baseline wander in lead(s) V3 No acute ischemia No old tracing to compare            MDM   1. Acute bronchitis    Sx c/w mild bronchitis. Improved after albuterol. CXR w/o PNA. EKG benign. Will treat symptomatically with albuterol, OTC symptomatic care and outpatient f/u.    Audree Camel, MD 04/08/13 [id number]

## 2013-04-08 NOTE — ED Notes (Signed)
Pt reports cough x 1 week with white phlegm. No fever/chills. Reports chest congestion as well. No nausea.

## 2013-08-11 ENCOUNTER — Encounter: Payer: Self-pay | Admitting: Internal Medicine

## 2013-08-11 ENCOUNTER — Ambulatory Visit: Payer: No Typology Code available for payment source | Attending: Internal Medicine | Admitting: Internal Medicine

## 2013-08-11 VITALS — BP 155/81 | HR 61 | Temp 98.5°F | Resp 16 | Ht 70.5 in | Wt 251.0 lb

## 2013-08-11 DIAGNOSIS — N939 Abnormal uterine and vaginal bleeding, unspecified: Secondary | ICD-10-CM

## 2013-08-11 DIAGNOSIS — E1110 Type 2 diabetes mellitus with ketoacidosis without coma: Secondary | ICD-10-CM

## 2013-08-11 DIAGNOSIS — I10 Essential (primary) hypertension: Secondary | ICD-10-CM

## 2013-08-11 LAB — GLUCOSE, POCT (MANUAL RESULT ENTRY): POC Glucose: 121 mg/dl — AB (ref 70–99)

## 2013-08-11 LAB — POCT GLYCOSYLATED HEMOGLOBIN (HGB A1C): HEMOGLOBIN A1C: 6.9

## 2013-08-11 MED ORDER — AMLODIPINE BESYLATE 10 MG PO TABS: 10.0000 mg | ORAL_TABLET | Freq: Every day | ORAL | Status: DC

## 2013-08-11 NOTE — Progress Notes (Unsigned)
Pt is here today because she is having vaginal bleeding.

## 2013-08-11 NOTE — Progress Notes (Unsigned)
Patient ID: Desiree Sanchez, female   DOB: August 20, 1950, 63 y.o.   MRN: [id number]    HPI: Desiree Sanchez is a 63 y.o. female presenting on 08/11/2013 with PMH as below here for vaginal stopping going on for a few weeks now. H/o polyps in the uterus which were removed in the past.  BP noted to be high- she states she does not like the HCTZ due to diuretic effects.     Past Medical History  Diagnosis Date  . Hypertension   . Diabetes mellitus without complication   . Arthritis     Past Surgical History  Procedure Laterality Date  . Appendectomy      Current Outpatient Prescriptions  Medication Sig Dispense Refill  . atenolol (TENORMIN) 25 MG tablet Take 1 tablet (25 mg total) by mouth daily.  30 tablet  6  . ibuprofen (ADVIL,MOTRIN) 200 MG tablet Take 400-600 mg by mouth every 6 (six) hours as needed for pain.      . metFORMIN (GLUCOPHAGE) 500 MG tablet Take 1 tablet (500 mg total) by mouth 2 (two) times daily with a meal.  60 tablet  6   No current facility-administered medications for this visit.    Allergies  Allergen Reactions  . Lisinopril Swelling    REACTION: Swolen lips (only)  . Naproxen Itching and Palpitations    No family history on file.  History   Social History  . Marital Status: Single    Spouse Name: N/A    Number of Children: N/A  . Years of Education: N/A   Occupational History  . Not on file.   Social History Main Topics  . Smoking status: Former Research scientist (life sciences)  . Smokeless tobacco: Never Used  . Alcohol Use: No  . Drug Use: No  . Sexual Activity:    Other Topics Concern  . Not on file   Social History Narrative  . No narrative on file    Review of Systems  Review of Systems  Constitutional: Negative for fever, chills, diaphoresis, activity change, appetite change and fatigue.  HENT: Negative for ear pain, nosebleeds, congestion, facial swelling, rhinorrhea, neck pain, neck stiffness and ear discharge.  Eyes: Negative for pain, discharge,  redness, itching and visual disturbance.  Respiratory: Negative for cough, choking, chest tightness, shortness of breath, wheezing and stridor.  Cardiovascular: Negative for chest pain, palpitations and leg swelling.  Gastrointestinal: Negative for abdominal distention, vomiting, diarrhea or consitpation Genitourinary: Negative for dysuria, urgency, frequency, hematuria, flank pain, decreased urine volume, difficulty urinating and dyspareunia.  Musculoskeletal: Negative for back pain, joint swelling, + for arthralgias- no gait problem.  Neurological: Negative for dizziness, tremors, seizures, syncope, facial asymmetry, speech difficulty, weakness, light-headedness, numbness and headaches.  Hematological: Negative for adenopathy. Does not bruise/bleed easily.  Psychiatric/Behavioral: Negative for hallucinations, behavioral problems, confusion, dysphoric mood   Objective:  BP 155/81  Pulse 61  Temp(Src) 98.5 F (36.9 C) (Oral)  Resp 16  Ht 5' 10.5" (1.791 m)  Wt 251 lb (113.853 kg)  BMI 35.49 kg/m2  SpO2 96% Filed Weights   08/11/13 1636  Weight: 251 lb (113.853 kg)     Physical Exam  Constitutional: Appears well-developed and well-nourished. No distress. HENT: Normocephalic. External right and left ear normal. Oropharynx is clear and moist.  Eyes: Conjunctivae and EOM are normal. PERRLA, no scleral icterus.  Neck: Normal ROM. Neck supple. No JVD. No tracheal deviation. No thyromegaly.  CVS: RRR, S1/S2 +, + 2/6 murmur in RU sternal border, no gallops, no carotid  bruit.  Pulmonary: Effort and breath sounds normal, no stridor, rhonchi, wheezes, rales.  Abdominal: Soft. BS +,  no distension, tenderness, rebound or guarding.  Musculoskeletal: Normal range of motion. No edema and no tenderness.  Neuro: Alert. Normal reflexes, muscle tone coordination. No cranial nerve deficit. Skin: Skin is warm and dry. No rash noted. Not diaphoretic. No erythema. No pallor.  Psychiatric: Normal mood  and affect. Behavior, judgment, thought content normal.   Lab Results  Component Value Date   WBC 8.0 04/22/2009   HGB 12.4 04/22/2009   HCT 38.5 04/22/2009   MCV 90.0 04/22/2009   PLT 334 04/22/2009   Lab Results  Component Value Date   CREATININE 1.38* 05/20/2009   BUN 20 05/20/2009   NA 139 05/20/2009   K 4.5 05/20/2009   CL 102 05/20/2009   CO2 21 05/20/2009    Lab Results  Component Value Date   HGBA1C 6.2 03/13/2013   Lipid Panel     Component Value Date/Time   CHOL 208* 04/22/2009 2257   TRIG 120 04/22/2009 2257   HDL 51 04/22/2009 2257   CHOLHDL 4.1 Ratio 04/22/2009 2257   VLDL 24 04/22/2009 2257   LDLCALC 133* 04/22/2009 2257        Patient Active Problem List   Diagnosis Date Noted  . Obesity (BMI 30-39.9) 03/13/2013  . GOUT 05/20/2009  . CERVICAL POLYP 05/20/2009  . VITAMIN D DEFICIENCY 05/06/2009  . HEART MURMUR, SYSTOLIC 04/01/8526  . DIABETES MELLITUS, TYPE II 04/22/2009  . OBESITY, UNSPECIFIED 04/22/2009  . HYPERTENSION 04/22/2009  . ACUTE APICAL PERIODONTITIS OF PULPAL ORIGIN 04/22/2009  . OSTEOARTHRITIS 04/22/2009     Preventative Medicine:  Health Maintenance  Topic Date Due  . Foot Exam  08/24/1958  . Ophthalmology Exam  08/24/1958  . Pap Smear  08/24/1966  . Colonoscopy  08/24/1998  . Zostavax  08/23/2008  . Urine Microalbumin  05/06/2010  . Hemoglobin A1c  09/11/2013  . Influenza Vaccine  01/06/2014  . Pneumococcal Polysaccharide Vaccine (##2) 04/22/2014  . Mammogram  05/12/2014  . Tetanus/tdap  04/23/2019    Adult vaccines due  Topic Date Due  . Zostavax  08/23/2008  . 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   The patient was given clear instructions to go to ER or return to medical center if symptoms don't improve, worsen or new problems develop. The patient verbalized understanding. The patient was told to call to get lab results if they haven't heard anything in the next week.     Debbe Odea, MD

## 2013-08-14 ENCOUNTER — Encounter: Payer: Self-pay | Admitting: Obstetrics & Gynecology

## 2013-09-06 ENCOUNTER — Encounter: Payer: No Typology Code available for payment source | Admitting: Obstetrics & Gynecology

## 2013-11-01 ENCOUNTER — Encounter: Payer: Self-pay | Admitting: General Practice

## 2013-11-13 DIAGNOSIS — K5730 Diverticulosis of large intestine without perforation or abscess without bleeding: Secondary | ICD-10-CM | POA: Diagnosis not present

## 2013-11-13 DIAGNOSIS — K759 Inflammatory liver disease, unspecified: Secondary | ICD-10-CM | POA: Diagnosis not present

## 2013-11-13 DIAGNOSIS — Z1211 Encounter for screening for malignant neoplasm of colon: Secondary | ICD-10-CM | POA: Diagnosis not present

## 2013-11-13 DIAGNOSIS — E119 Type 2 diabetes mellitus without complications: Secondary | ICD-10-CM | POA: Diagnosis not present

## 2013-11-13 DIAGNOSIS — K219 Gastro-esophageal reflux disease without esophagitis: Secondary | ICD-10-CM | POA: Diagnosis not present

## 2013-11-20 ENCOUNTER — Other Ambulatory Visit: Payer: Self-pay | Admitting: Internal Medicine

## 2013-12-04 ENCOUNTER — Ambulatory Visit (INDEPENDENT_AMBULATORY_CARE_PROVIDER_SITE_OTHER): Payer: Medicare Other | Admitting: Obstetrics and Gynecology

## 2013-12-04 ENCOUNTER — Other Ambulatory Visit (HOSPITAL_COMMUNITY)
Admission: RE | Admit: 2013-12-04 | Discharge: 2013-12-04 | Disposition: A | Discharge status: Home / Self Care | Payer: Medicare Other | Source: Ambulatory Visit | Attending: Obstetrics & Gynecology | Admitting: Obstetrics & Gynecology

## 2013-12-04 ENCOUNTER — Encounter: Payer: Self-pay | Admitting: Obstetrics and Gynecology

## 2013-12-04 VITALS — BP 124/80 | HR 71 | Temp 97.8°F | Ht 70.0 in | Wt 241.5 lb

## 2013-12-04 DIAGNOSIS — N950 Postmenopausal bleeding: Secondary | ICD-10-CM | POA: Insufficient documentation

## 2013-12-04 DIAGNOSIS — N841 Polyp of cervix uteri: Secondary | ICD-10-CM | POA: Diagnosis not present

## 2013-12-04 NOTE — Progress Notes (Signed)
Patient ID: Desiree Sanchez, female   DOB: 20-Sep-1950, 63 y.o.   MRN: [id number] 63 yo G3P3 presenting today as a referral for evaluation of vaginal spotting. Patient has been menopausal for 15 years. Patient reports last episode of spotting was 3-4 months ago and nothing since. She has had a similar episode in 2006 and had polyps removed. Patient also reports vaginal irritation in the absence of a discharge  Past Medical History  Diagnosis Date  . Hypertension   . Diabetes mellitus without complication   . Arthritis    Past Surgical History  Procedure Laterality Date  . Appendectomy     No family history on file. History  Substance Use Topics  . Smoking status: Former Research scientist (life sciences)  . Smokeless tobacco: Never Used  . Alcohol Use: No   GENERAL: Well-developed, well-nourished female in no acute distress.  ABDOMEN: Soft, nontender, nondistended. No organomegaly. PELVIC: Normal external female genitalia. Vagina is pink and rugated.  Normal discharge. Normal appearing cervix with a 1.5 cm polyp visualized at external os. Uterus is normal in size. No adnexal mass or tenderness. EXTREMITIES: No cyanosis, clubbing, or edema, 2+ distal pulses.  A/P 63 yo with postmenopausal vaginal bleeding - Wet prep collected - Cervical polyp removal Polyp was grasp with a ring forceps and gently removed by a twisting/rotating motion - Pelvic ultrasound ordered - RTC in 2 weeks for results

## 2013-12-05 LAB — WET PREP, GENITAL
Clue Cells Wet Prep HPF POC: NONE SEEN
Trich, Wet Prep: NONE SEEN
WBC, Wet Prep HPF POC: NONE SEEN
YEAST WET PREP: NONE SEEN

## 2013-12-13 ENCOUNTER — Ambulatory Visit (HOSPITAL_COMMUNITY)
Admission: RE | Admit: 2013-12-13 | Discharge: 2013-12-13 | Disposition: A | Discharge status: Home / Self Care | Payer: Medicare Other | Source: Ambulatory Visit | Attending: Obstetrics and Gynecology | Admitting: Obstetrics and Gynecology

## 2013-12-13 DIAGNOSIS — R9389 Abnormal findings on diagnostic imaging of other specified body structures: Secondary | ICD-10-CM | POA: Diagnosis not present

## 2013-12-13 DIAGNOSIS — N950 Postmenopausal bleeding: Secondary | ICD-10-CM | POA: Diagnosis not present

## 2013-12-13 DIAGNOSIS — D250 Submucous leiomyoma of uterus: Secondary | ICD-10-CM | POA: Insufficient documentation

## 2013-12-29 ENCOUNTER — Other Ambulatory Visit: Payer: Self-pay | Admitting: Internal Medicine

## 2014-01-03 ENCOUNTER — Encounter: Payer: Self-pay | Admitting: Obstetrics and Gynecology

## 2014-01-03 ENCOUNTER — Ambulatory Visit (INDEPENDENT_AMBULATORY_CARE_PROVIDER_SITE_OTHER): Payer: Medicare Other | Admitting: Obstetrics and Gynecology

## 2014-01-03 VITALS — BP 115/74 | HR 58 | Temp 98.1°F | Ht 70.0 in | Wt 246.3 lb

## 2014-01-03 DIAGNOSIS — Z7189 Other specified counseling: Secondary | ICD-10-CM | POA: Diagnosis not present

## 2014-01-03 DIAGNOSIS — Z712 Person consulting for explanation of examination or test findings: Secondary | ICD-10-CM

## 2014-01-03 MED ORDER — ESTRADIOL 0.1 MG/GM VA CREA: 1.0000 | TOPICAL_CREAM | Freq: Every day | VAGINAL | Status: DC

## 2014-01-03 NOTE — Progress Notes (Signed)
Patient ID: Desiree Sanchez, female   DOB: 16-Jan-1951, 63 y.o.   MRN: [id number] 63 yo G3P3 presenting today to discuss results of pelvic ultrasound and removed endocervical polyp.   6/29 Cervix, polyp - BENIGN ENDOCERVICAL/LOWER UTERINE SEGMENT POLYP. - NEGATIVE FOR ATYPIA, HYPERPLASIA, OR MALIGNANCY.  7/8 ultrasound FINDINGS:  Uterus  Measurements: 6.5 x 3.8 x 4.4 cm. There is a sub mucosal fibroid  within the uterine fundus measuring 2.2 x 2.1 x 2.3 cm.  Endometrium  Thickness: 15.7 mm. The endometrium is heterogeneous in appearance  with multiple cystic areas.  Right ovary  Measurements: Not visualized.  Left ovary  Measurements: Not visualize.  Other findings  No free fluid.  Results were reviewed and explained  A/P 63 yo here for results - Discussed with patient thickened endometrium seen on ultrasound and benefit of dilatation and curettage to remove possible endometrial polyps. - Patient is not interested in having a surgical procedure at this time given that she is asymptomatic. Patient agrees to return if bleeding starts, knowing that it will be to schedule a D&C - Patient complaining of vaginal pruritis, likely related to vaginal atrophy. Rx estrace cream provided to be taken qHS for 2 weeks than once or twice a week for maintenance - RTC prn

## 2014-01-30 ENCOUNTER — Encounter: Payer: Self-pay | Admitting: General Practice

## 2014-02-26 DIAGNOSIS — Z23 Encounter for immunization: Secondary | ICD-10-CM | POA: Diagnosis not present

## 2014-03-27 ENCOUNTER — Other Ambulatory Visit: Payer: Self-pay | Admitting: Internal Medicine

## 2014-04-09 ENCOUNTER — Encounter: Payer: Self-pay | Admitting: Obstetrics and Gynecology

## 2014-04-13 ENCOUNTER — Encounter: Payer: Self-pay | Admitting: Family Medicine

## 2014-04-13 ENCOUNTER — Ambulatory Visit: Payer: Medicare Other | Attending: Family Medicine | Admitting: Family Medicine

## 2014-04-13 VITALS — BP 112/71 | HR 62 | Temp 98.2°F | Resp 18 | Ht 70.5 in | Wt 238.0 lb

## 2014-04-13 DIAGNOSIS — Z87891 Personal history of nicotine dependence: Secondary | ICD-10-CM | POA: Insufficient documentation

## 2014-04-13 DIAGNOSIS — M109 Gout, unspecified: Secondary | ICD-10-CM

## 2014-04-13 DIAGNOSIS — E559 Vitamin D deficiency, unspecified: Secondary | ICD-10-CM | POA: Diagnosis not present

## 2014-04-13 DIAGNOSIS — I10 Essential (primary) hypertension: Secondary | ICD-10-CM | POA: Insufficient documentation

## 2014-04-13 DIAGNOSIS — E1121 Type 2 diabetes mellitus with diabetic nephropathy: Secondary | ICD-10-CM

## 2014-04-13 DIAGNOSIS — Z0000 Encounter for general adult medical examination without abnormal findings: Secondary | ICD-10-CM | POA: Diagnosis not present

## 2014-04-13 DIAGNOSIS — N289 Disorder of kidney and ureter, unspecified: Secondary | ICD-10-CM | POA: Diagnosis not present

## 2014-04-13 DIAGNOSIS — E119 Type 2 diabetes mellitus without complications: Secondary | ICD-10-CM | POA: Insufficient documentation

## 2014-04-13 LAB — LIPID PANEL
Cholesterol: 179 mg/dL (ref 0–200)
HDL: 37 mg/dL — ABNORMAL LOW (ref >39)
LDL CALC: 100 mg/dL — AB (ref 0–99)
Total CHOL/HDL Ratio: 4.8 Ratio
Triglycerides: 211 mg/dL — ABNORMAL HIGH (ref <150)
VLDL: 42 mg/dL — AB (ref 0–40)

## 2014-04-13 LAB — COMPLETE METABOLIC PANEL WITH GFR
ALBUMIN: 4 g/dL (ref 3.5–5.2)
ALT: 10 U/L (ref 0–35)
AST: 13 U/L (ref 0–37)
Alkaline Phosphatase: 92 U/L (ref 39–117)
BUN: 19 mg/dL (ref 6–23)
CO2: 24 mEq/L (ref 19–32)
CREATININE: 1.33 mg/dL — AB (ref 0.50–1.10)
Calcium: 9.4 mg/dL (ref 8.4–10.5)
Chloride: 104 mEq/L (ref 96–112)
GFR, Est African American: 48 mL/min — ABNORMAL LOW
GFR, Est Non African American: 42 mL/min — ABNORMAL LOW
Glucose, Bld: 109 mg/dL — ABNORMAL HIGH (ref 70–99)
Potassium: 4.5 mEq/L (ref 3.5–5.3)
SODIUM: 138 meq/L (ref 135–145)
Total Bilirubin: 0.4 mg/dL (ref 0.2–1.2)
Total Protein: 7.4 g/dL (ref 6.0–8.3)

## 2014-04-13 LAB — GLUCOSE, POCT (MANUAL RESULT ENTRY): POC Glucose: 95 mg/dl (ref 70–99)

## 2014-04-13 LAB — CBC
HCT: 34.4 % — ABNORMAL LOW (ref 36.0–46.0)
Hemoglobin: 11.3 g/dL — ABNORMAL LOW (ref 12.0–15.0)
MCH: 28.5 pg (ref 26.0–34.0)
MCHC: 32.8 g/dL (ref 30.0–36.0)
MCV: 86.9 fL (ref 78.0–100.0)
Platelets: 309 10*3/uL (ref 150–400)
RBC: 3.96 MIL/uL (ref 3.87–5.11)
RDW: 14.3 % (ref 11.5–15.5)
WBC: 7.6 10*3/uL (ref 4.0–10.5)

## 2014-04-13 LAB — URIC ACID: Uric Acid, Serum: 9.8 mg/dL — ABNORMAL HIGH (ref 2.4–7.0)

## 2014-04-13 LAB — POCT GLYCOSYLATED HEMOGLOBIN (HGB A1C): Hemoglobin A1C: 6.6

## 2014-04-13 MED ORDER — ATENOLOL 25 MG PO TABS: 25.0000 mg | ORAL_TABLET | Freq: Every day | ORAL | Status: DC

## 2014-04-13 MED ORDER — INDOMETHACIN 50 MG PO CAPS: 50.0000 mg | ORAL_CAPSULE | Freq: Three times a day (TID) | ORAL | Status: DC | PRN

## 2014-04-13 MED ORDER — METFORMIN HCL ER 500 MG PO TB24: 1000.0000 mg | ORAL_TABLET | Freq: Every day | ORAL | Status: DC

## 2014-04-13 MED ORDER — ZOSTER VACCINE LIVE 19400 UNT/0.65ML ~~LOC~~ SOLR
0.6500 mL | Freq: Once | SUBCUTANEOUS | Status: DC
Start: 2014-04-13 — End: 2014-04-13

## 2014-04-13 MED ORDER — ZOSTER VACCINE LIVE 19400 UNT/0.65ML ~~LOC~~ SOLR: 0.6500 mL | Freq: Once | SUBCUTANEOUS | Status: DC

## 2014-04-13 MED ORDER — HYDROCHLOROTHIAZIDE 25 MG PO TABS: 25.0000 mg | ORAL_TABLET | Freq: Every day | ORAL | Status: DC

## 2014-04-13 NOTE — Progress Notes (Signed)
   Subjective:    Patient ID: Desiree Sanchez, female    DOB: Jan 15, 1951, 63 y.o.   MRN: [id number] CC: medication refill, DM2 f/u  HPI  1. CHRONIC DIABETES  Disease Monitoring  Blood Sugar Ranges: 104 fasting. Goal is < 100 fasting.   Polyuria: no   Visual problems: no   Medication Compliance: yes  Medication Side Effects  Hypoglycemia: nothing under 80  [place] Exam: cannot recall last   Foot Exam: done today   Diet pattern: eats well, avoids soda   Exercise: yoga and swimming   2. HM: had flu shot at [facility] 9/15. Due for zostavax.  3. Gout: gets pain at R dorsal foot, R MTP, R knee, L knee, L dorsal foot sometimes. Takes ibuprofen or indocin. Had colchicine some years ago which worked well.   4. Vit D def: not taking Vit D. No recent rechecks. No fractures.   Soc Hx:  Former smoker  Review of Systems As per HPI     Objective:   Physical Exam BP 112/71 mmHg  Pulse 62  Temp(Src) 98.2 F (36.8 C) (Oral)  Resp 18  Ht 5' 10.5" (1.791 m)  Wt 238 lb (107.956 kg)  BMI 33.66 kg/m2  SpO2 99% General appearance: alert, cooperative and no distress Eyes: conjunctivae/corneas clear. PERRL, EOM's intact.  Ears: normal TM's and external ear canals both ears Throat: lips, mucosa, and tongue normal; teeth and gums normal Neck: no adenopathy, supple, symmetrical, trachea midline and thyroid not enlarged, symmetric, no tenderness/mass/nodules Lungs: clear to auscultation bilaterally Heart: regular rate and rhythm, S1, S2 normal, no murmur, click, rub or gallop Extremities: no edema, mild knee swelling and joint enlargement. R MTP joint enlargement.   Lab Results  Component Value Date   HGBA1C 6.9 08/11/2013   Lab Results  Component Value Date   HGBA1C 6.6 04/13/2014        Assessment & Plan:

## 2014-04-13 NOTE — Patient Instructions (Addendum)
Ms. Ogburn,  Thank you for coming in today. It was a pleasure meeting you. I look forward to being your primary doctor.   1. Diabetes: very well controlled.  Metformin XR 1000 mg after supper. Referral for vision check. Checking cholesterol and urine for protein today.  2. HTN: well controlled  3. Gout: checking uric acid level. We will call you with results and whether colchicine can be restarted.   4. Vit D deficiency: checking level.  Take shingles vaccine Rx to your local drug store.   F/u in 6 months   Dr. Adrian Blackwater

## 2014-04-13 NOTE — Assessment & Plan Note (Addendum)
1. Diabetes: very well controlled.  Metformin XR 1000 mg after supper. Referral to ophthalmology for yearly vision check  Checking cholesterol and urine for protein today.  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 Regular monitoring. Tight BP and CBG control.   F/u in 6 months

## 2014-04-13 NOTE — Assessment & Plan Note (Addendum)
Repeat level today Replete if needed   Vit D insufficiency, plan for daily calcium and vit D supplement.

## 2014-04-13 NOTE — Progress Notes (Signed)
Medicine Refills Complaining of GOUT pain and stomach pain  Stated taking Ibuprofen not helping for arthritis

## 2014-04-13 NOTE — Assessment & Plan Note (Signed)
rx for zostavax given today

## 2014-04-13 NOTE — Assessment & Plan Note (Addendum)
A: with flares P: Check uric acid Restart colchicine plus uric acid lowering agent if elevated   Uric acid level 9.8, goal less than 6  STOP HCTZ Start novasc 10 mg daily  Plan start allopurinol 100 mg daily, titrate up to 300 mg daily over the next two weeks.  Start colchicine 0.6 mg BID while taking

## 2014-04-14 LAB — MICROALBUMIN / CREATININE URINE RATIO
Creatinine, Urine: 225.9 mg/dL
MICROALB/CREAT RATIO: 15.5 mg/g (ref 0.0–30.0)
Microalb, Ur: 3.5 mg/dL — ABNORMAL HIGH (ref <2.0)

## 2014-04-14 LAB — VITAMIN D 25 HYDROXY (VIT D DEFICIENCY, FRACTURES): VIT D 25 HYDROXY: 26 ng/mL — AB (ref 30–89)

## 2014-04-16 ENCOUNTER — Telehealth: Payer: Self-pay | Admitting: Family Medicine

## 2014-04-16 NOTE — Telephone Encounter (Signed)
Pt calling in regards to results, please f/u with pt.

## 2014-04-17 ENCOUNTER — Telehealth: Payer: Self-pay | Admitting: Family Medicine

## 2014-04-17 NOTE — Telephone Encounter (Signed)
Pt. Called to request her blood work results. Please f/u with pt.

## 2014-04-18 ENCOUNTER — Telehealth: Payer: Self-pay | Admitting: Family Medicine

## 2014-04-18 MED ORDER — VITAMIN D3 10 MCG (400 UNIT) PO TABS: 400.0000 [IU] | ORAL_TABLET | Freq: Every day | ORAL | Status: DC

## 2014-04-18 MED ORDER — ATORVASTATIN CALCIUM 40 MG PO TABS: 40.0000 mg | ORAL_TABLET | Freq: Every day | ORAL | Status: DC

## 2014-04-18 MED ORDER — CALCIUM CARBONATE 1250 (500 CA) MG PO TABS: 1.0000 | ORAL_TABLET | Freq: Every day | ORAL | Status: DC

## 2014-04-18 MED ORDER — AMLODIPINE BESYLATE 10 MG PO TABS: 10.0000 mg | ORAL_TABLET | Freq: Every day | ORAL | Status: DC

## 2014-04-18 MED ORDER — ATORVASTATIN CALCIUM 20 MG PO TABS: 20.0000 mg | ORAL_TABLET | Freq: Every day | ORAL | Status: DC

## 2014-04-18 MED ORDER — INDOMETHACIN 50 MG PO CAPS: 50.0000 mg | ORAL_CAPSULE | Freq: Two times a day (BID) | ORAL | Status: DC

## 2014-04-18 MED ORDER — ALLOPURINOL 100 MG PO TABS: 100.0000 mg | ORAL_TABLET | Freq: Every day | ORAL | Status: DC

## 2014-04-18 MED ORDER — COLCHICINE 0.6 MG PO TABS: 0.6000 mg | ORAL_TABLET | Freq: Two times a day (BID) | ORAL | Status: DC

## 2014-04-18 NOTE — Addendum Note (Signed)
Addended by: Boykin Nearing on: 04/18/2014 11:08 AM   Modules accepted: Orders, Medications

## 2014-04-18 NOTE — Telephone Encounter (Signed)
Called patient to discuss lab results and medication changes. She is aware of all, no questions. Would like to continue indocin, continued.  Called pharmacy to make sure all changes were reflected.

## 2014-06-14 ENCOUNTER — Telehealth: Payer: Self-pay | Admitting: Family Medicine

## 2014-06-14 DIAGNOSIS — I10 Essential (primary) hypertension: Secondary | ICD-10-CM

## 2014-06-14 MED ORDER — AMLODIPINE BESYLATE 10 MG PO TABS: 5.0000 mg | ORAL_TABLET | Freq: Every day | ORAL | Status: DC

## 2014-06-14 NOTE — Addendum Note (Signed)
Addended by: Boykin Nearing on: 06/14/2014 02:37 PM   Modules accepted: Orders

## 2014-06-14 NOTE — Telephone Encounter (Signed)
Patient called stating that she can not afford her medication and needs for her PCP to call [facility] in order for her to be exempt the fee, patient states that they instructed her to let her PCP call [employer] at [phone] for them to be able to give her indomethacin (INDOCIN) 50 MG capsule for free. Patient states that she is on social security income and cannot afford the medication. Patient also states that amLODipine is making her feet swell. Please f/u with pt.

## 2014-06-14 NOTE — Telephone Encounter (Signed)
1. Please call [employer] on my behalf to request exemption.   2. Regarding norvasc, patient may decrease to 5 mg daily. Will need to return to clinic for BP check by RN 7-10 weeks following dose decrease. If BP well controlled at that time but there is still swelling the norvasc can be discontinued with plan to control BP with atenolol only.

## 2014-06-15 ENCOUNTER — Telehealth: Payer: Self-pay | Admitting: *Deleted

## 2014-06-15 NOTE — Telephone Encounter (Signed)
1.  [employer] was call to request exemption for Indomethacin 50 mg.     Exemption and prior authorization was placed, form was faxed to our clinic Per [employer]. Reference PA [id number]   Exemption # [id number]   2. Regarding norvasc, patient may decrease to 5 mg daily. Will need to return to clinic for BP check by RN 7-10 days following dose decrease.  If BP well controlled at that time but there is still swelling the norvasc can be discontinued with plan to control BP with atenolol only.   Pt aware

## 2014-06-21 ENCOUNTER — Telehealth: Payer: Self-pay | Admitting: Family Medicine

## 2014-06-21 DIAGNOSIS — E119 Type 2 diabetes mellitus without complications: Secondary | ICD-10-CM

## 2014-06-21 DIAGNOSIS — M109 Gout, unspecified: Secondary | ICD-10-CM

## 2014-06-21 DIAGNOSIS — I10 Essential (primary) hypertension: Secondary | ICD-10-CM

## 2014-06-21 MED ORDER — COLCHICINE 0.6 MG PO TABS: 0.6000 mg | ORAL_TABLET | Freq: Two times a day (BID) | ORAL | Status: DC

## 2014-06-21 MED ORDER — ATENOLOL 25 MG PO TABS: 25.0000 mg | ORAL_TABLET | Freq: Every day | ORAL | Status: DC

## 2014-06-21 MED ORDER — METFORMIN HCL ER 500 MG PO TB24: 1000.0000 mg | ORAL_TABLET | Freq: Every day | ORAL | Status: DC

## 2014-06-21 MED ORDER — ALLOPURINOL 100 MG PO TABS: 100.0000 mg | ORAL_TABLET | Freq: Every day | ORAL | Status: DC

## 2014-06-21 MED ORDER — AMLODIPINE BESYLATE 10 MG PO TABS: 5.0000 mg | ORAL_TABLET | Freq: Every day | ORAL | Status: DC

## 2014-06-21 NOTE — Telephone Encounter (Signed)
Rx was send to [place]

## 2014-06-21 NOTE — Telephone Encounter (Signed)
Patient is calling to check on status of her medications through [facility], patient states that they have cancelled all of her medications and does not have any more. Please f/u with pt.

## 2014-07-16 DIAGNOSIS — H524 Presbyopia: Secondary | ICD-10-CM | POA: Diagnosis not present

## 2014-07-16 DIAGNOSIS — H5210 Myopia, unspecified eye: Secondary | ICD-10-CM | POA: Diagnosis not present

## 2014-07-26 ENCOUNTER — Ambulatory Visit (HOSPITAL_COMMUNITY)
Admission: RE | Admit: 2014-07-26 | Discharge: 2014-07-26 | Disposition: A | Discharge status: Home / Self Care | Payer: Medicare PPO | Source: Ambulatory Visit | Attending: Family Medicine | Admitting: Family Medicine

## 2014-07-26 ENCOUNTER — Encounter: Payer: Self-pay | Admitting: Family Medicine

## 2014-07-26 ENCOUNTER — Ambulatory Visit (HOSPITAL_BASED_OUTPATIENT_CLINIC_OR_DEPARTMENT_OTHER): Payer: Medicare PPO | Admitting: Family Medicine

## 2014-07-26 VITALS — BP 146/76 | HR 63 | Temp 98.3°F | Resp 16 | Ht 70.5 in | Wt 232.0 lb

## 2014-07-26 DIAGNOSIS — I10 Essential (primary) hypertension: Secondary | ICD-10-CM

## 2014-07-26 DIAGNOSIS — M7989 Other specified soft tissue disorders: Secondary | ICD-10-CM | POA: Diagnosis not present

## 2014-07-26 DIAGNOSIS — S6991XA Unspecified injury of right wrist, hand and finger(s), initial encounter: Secondary | ICD-10-CM | POA: Diagnosis not present

## 2014-07-26 DIAGNOSIS — M109 Gout, unspecified: Secondary | ICD-10-CM

## 2014-07-26 DIAGNOSIS — E1121 Type 2 diabetes mellitus with diabetic nephropathy: Secondary | ICD-10-CM | POA: Diagnosis not present

## 2014-07-26 DIAGNOSIS — M25531 Pain in right wrist: Secondary | ICD-10-CM | POA: Diagnosis not present

## 2014-07-26 DIAGNOSIS — M79641 Pain in right hand: Secondary | ICD-10-CM | POA: Diagnosis not present

## 2014-07-26 DIAGNOSIS — E119 Type 2 diabetes mellitus without complications: Secondary | ICD-10-CM | POA: Diagnosis not present

## 2014-07-26 DIAGNOSIS — M19241 Secondary osteoarthritis, right hand: Secondary | ICD-10-CM

## 2014-07-26 LAB — BASIC METABOLIC PANEL
BUN: 13 mg/dL (ref 6–23)
CHLORIDE: 109 meq/L (ref 96–112)
CO2: 22 meq/L (ref 19–32)
CREATININE: 1.06 mg/dL (ref 0.50–1.10)
Calcium: 9.8 mg/dL (ref 8.4–10.5)
GLUCOSE: 104 mg/dL — AB (ref 70–99)
Potassium: 4.9 mEq/L (ref 3.5–5.3)
Sodium: 142 mEq/L (ref 135–145)

## 2014-07-26 LAB — GLUCOSE, POCT (MANUAL RESULT ENTRY): POC Glucose: 121 mg/dl — AB (ref 70–99)

## 2014-07-26 LAB — URIC ACID: URIC ACID, SERUM: 6.4 mg/dL (ref 2.4–7.0)

## 2014-07-26 LAB — POCT GLYCOSYLATED HEMOGLOBIN (HGB A1C): HEMOGLOBIN A1C: 6.2

## 2014-07-26 MED ORDER — AMLODIPINE BESYLATE 10 MG PO TABS: 5.0000 mg | ORAL_TABLET | Freq: Every day | ORAL | Status: DC

## 2014-07-26 MED ORDER — ATENOLOL 25 MG PO TABS: 25.0000 mg | ORAL_TABLET | Freq: Every day | ORAL | Status: DC

## 2014-07-26 MED ORDER — ALLOPURINOL 300 MG PO TABS: 300.0000 mg | ORAL_TABLET | Freq: Every day | ORAL | Status: DC

## 2014-07-26 MED ORDER — ATORVASTATIN CALCIUM 20 MG PO TABS: 20.0000 mg | ORAL_TABLET | Freq: Every day | ORAL | Status: DC

## 2014-07-26 MED ORDER — METFORMIN HCL ER 500 MG PO TB24: 1000.0000 mg | ORAL_TABLET | Freq: Every day | ORAL | Status: DC

## 2014-07-26 MED ORDER — TRAMADOL HCL 50 MG PO TABS: 50.0000 mg | ORAL_TABLET | Freq: Three times a day (TID) | ORAL | Status: DC | PRN

## 2014-07-26 NOTE — Patient Instructions (Signed)
[name],   Thank you for coming in today.   1. R hand and wrist pain:  I suspect osteoarthritis  Recheck uric acid, BMP Compression hand and wrist glove at night.  Tramadol for severe pain   Please go for x-rays of hand and wrist at your earliest convenience.  You will be called with lab and x-ray results.   F/u in 3 months sooner if needed  Dr. Adrian Blackwater

## 2014-07-26 NOTE — Progress Notes (Signed)
Patient complains of right hand pain with swelling for the last month Patient crotches all day long and unable to do due to pain Pain radiates to wrist Patient [facility]

## 2014-07-29 ENCOUNTER — Encounter: Payer: Self-pay | Admitting: Family Medicine

## 2014-07-29 DIAGNOSIS — G5601 Carpal tunnel syndrome, right upper limb: Secondary | ICD-10-CM | POA: Insufficient documentation

## 2014-07-29 NOTE — Progress Notes (Signed)
   Subjective:    Patient ID: Desiree Sanchez, female    DOB: [birth year], 64 y.o.   MRN: [id number] CC: R hand pain  HPI 64 yo F R hand pain:  1. R hand pain: x one month. With pain. Pain to wrist. Improving. Patient was crocheting a lot to get ready for a craft sale.   Soc Hx: former smoker  Review of Systems As per HPI     Objective:   Physical Exam BP 146/76 mmHg  Pulse 63  Temp(Src) 98.3 F (36.8 C)  Resp 16  Ht 5' 10.5" (1.791 m)  Wt 232 lb (105.235 kg)  BMI 32.81 kg/m2  SpO2 99% General appearance: alert, cooperative and no distress Extremities: R hand with mild swelling at between 2nd and 3rd metarsal.      Assessment & Plan:

## 2014-07-29 NOTE — Assessment & Plan Note (Signed)
1. R hand and wrist pain:  I suspect osteoarthritis  Recheck uric acid, BMP Compression hand and wrist glove at night.  Tramadol for severe pain   Please go for x-rays of hand and wrist at your earliest convenience.  You will be called with lab and x-ray results.

## 2014-07-31 ENCOUNTER — Telehealth: Payer: Self-pay | Admitting: *Deleted

## 2014-07-31 NOTE — Telephone Encounter (Signed)
-----   Message from Minerva Ends, MD sent at 07/27/2014  2:26 PM EST ----- Normal wrist x-ray, no significant degeneration

## 2014-07-31 NOTE — Telephone Encounter (Signed)
Pt aware of resuts

## 2014-07-31 NOTE — Telephone Encounter (Signed)
-----   Message from Minerva Ends, MD sent at 07/27/2014  2:25 PM EST ----- Uric acid level normal Continue regimen Normal BMP

## 2014-07-31 NOTE — Telephone Encounter (Signed)
-----   Message from Minerva Ends, MD sent at 07/27/2014  2:27 PM EST ----- No significant degeneration are arthritis in hand.  Possible inflamed tendon.  Continue current treatment

## 2014-07-31 NOTE — Telephone Encounter (Signed)
Pt aware of result.

## 2014-09-07 ENCOUNTER — Other Ambulatory Visit: Payer: Self-pay | Admitting: Family Medicine

## 2014-09-10 ENCOUNTER — Encounter: Payer: Self-pay | Admitting: Family Medicine

## 2014-09-10 ENCOUNTER — Ambulatory Visit: Payer: Commercial Managed Care - HMO | Attending: Family Medicine | Admitting: Family Medicine

## 2014-09-10 VITALS — BP 126/75 | HR 57 | Temp 97.9°F | Resp 18 | Ht 70.5 in | Wt 238.0 lb

## 2014-09-10 DIAGNOSIS — M19241 Secondary osteoarthritis, right hand: Secondary | ICD-10-CM

## 2014-09-10 DIAGNOSIS — G5601 Carpal tunnel syndrome, right upper limb: Secondary | ICD-10-CM | POA: Diagnosis not present

## 2014-09-10 DIAGNOSIS — E1121 Type 2 diabetes mellitus with diabetic nephropathy: Secondary | ICD-10-CM

## 2014-09-10 LAB — GLUCOSE, POCT (MANUAL RESULT ENTRY): POC Glucose: 128 mg/dl — AB (ref 70–99)

## 2014-09-10 MED ORDER — TRAMADOL HCL 50 MG PO TABS: 50.0000 mg | ORAL_TABLET | Freq: Three times a day (TID) | ORAL | Status: DC | PRN

## 2014-09-10 MED ORDER — GABAPENTIN 100 MG PO CAPS: 100.0000 mg | ORAL_CAPSULE | Freq: Every day | ORAL | Status: DC

## 2014-09-10 NOTE — Progress Notes (Signed)
Complaining of Rt hand Rt thumb numb Worsen since last visit

## 2014-09-10 NOTE — Patient Instructions (Addendum)
Desiree Sanchez,  Thank you for coming in today. Your symptoms are consistent with carpal tunnel.   1. Tramadol refilled. 2. Gabapentin 100 mg nightly for 3 nights, 200 mg for 3 nights then 300 mg  3. Referral to physical therapy. 4. Continue to wear wrist splint   F/u in 6 weeks   Dr. Adrian Blackwater    Carpal Tunnel Syndrome The carpal tunnel is a narrow area located on the palm side of your wrist. The tunnel is formed by the wrist bones and ligaments. Nerves, blood vessels, and tendons pass through the carpal tunnel. Repeated wrist motion or certain diseases may cause swelling within the tunnel. This swelling pinches the main nerve in the wrist (median nerve) and causes the painful hand and arm condition called carpal tunnel syndrome. CAUSES   Repeated wrist motions.  Wrist injuries.  Certain diseases like arthritis, diabetes, alcoholism, hyperthyroidism, and kidney failure.  Obesity.  Pregnancy. SYMPTOMS   A "pins and needles" feeling in your fingers or hand, especially in your thumb, index and middle fingers.  Tingling or numbness in your fingers or hand.  An aching feeling in your entire arm, especially when your wrist and elbow are bent for long periods of time.  Wrist pain that goes up your arm to your shoulder.  Pain that goes down into your palm or fingers.  A weak feeling in your hands. DIAGNOSIS  Your health care provider will take your history and perform a physical exam. An electromyography test may be needed. This test measures electrical signals sent out by your nerves into the muscles. The electrical signals are usually slowed by carpal tunnel syndrome. You may also need X-rays. TREATMENT  Carpal tunnel syndrome may clear up by itself. Your health care provider may recommend a wrist splint or medicine such as a nonsteroidal anti-inflammatory medicine. Cortisone injections may help. Sometimes, surgery may be needed to free the pinched nerve.  HOME CARE INSTRUCTIONS    Take all medicine as directed by your health care provider. Only take over-the-counter or prescription medicines for pain, discomfort, or fever as directed by your health care provider.  If you were given a splint to keep your wrist from bending, wear it as directed. It is important to wear the splint at night. Wear the splint for as long as you have pain or numbness in your hand, arm, or wrist. This may take 1 to 2 months.  Rest your wrist from any activity that may be causing your pain. If your symptoms are work-related, you may need to talk to your employer about changing to a job that does not require using your wrist.  Put ice on your wrist after long periods of wrist activity.  Put ice in a plastic bag.  Place a towel between your skin and the bag.  Leave the ice on for 15-20 minutes, 03-04 times a day.  Keep all follow-up visits as directed by your health care provider. This includes any orthopedic referrals, physical therapy, and rehabilitation. Any delay in getting necessary care could result in a delay or failure of your condition to heal. SEEK IMMEDIATE MEDICAL CARE IF:   You have new, unexplained symptoms.  Your symptoms get worse and are not helped or controlled with medicines. MAKE SURE YOU:   Understand these instructions.  Will watch your condition.  Will get help right away if you are not doing well or get worse. Document Released: 05/22/2000 Document Revised: 10/09/2013 Document Reviewed: 04/10/2011 [facility] Patient Information 2015 [place], [place].  This information is not intended to replace advice given to you by your health care provider. Make sure you discuss any questions you have with your health care provider.

## 2014-09-10 NOTE — Progress Notes (Signed)
   Subjective:    Patient ID: Desiree Sanchez, female    DOB: [birth year], 64 y.o.   MRN: [id number] CC: R hand pain  HPI  1. R hand pain: patient with hx of gout. Has persistent R hand pain x 2 months now. Had x-ray of R hand and wrist, normal, no advanced arthritis. Had uric check, wnl. Pain is R thumb, 2nd and 3rd digits. Sharp, achy, numbness, shooting pain. Wakes her up at night. Tramadol controlled the pain. She took a break from crochet w/o improvement. She is wearing a wrist splint at night but does not believe it is helping. Ice seems to worsen pain. No improvement with indomethacin.    Soc Hx: non smoker  Review of Systems  Constitutional: Negative for fever.  Musculoskeletal: Positive for arthralgias.  Skin: Negative for color change, pallor and rash.  Neurological: Positive for numbness.       Objective:   Physical Exam BP 126/75 mmHg  Pulse 57  Temp(Src) 97.9 F (36.6 C) (Oral)  Resp 18  Ht 5' 10.5" (1.791 m)  Wt 238 lb (107.956 kg)  BMI 33.66 kg/m2  SpO2 98% General appearance: alert, cooperative and no distress Extremities: R hand with mild swelling between 2nd and 3rd metacarpal space distally. No joint swelling. 2+ radial pulse. + Phalen test. No rash on skin changes.        Assessment & Plan:

## 2014-09-11 NOTE — Assessment & Plan Note (Signed)
A: R hand pain, persistent, distribution, symptoms, and exam consistent with carpal tunnel. No evidence of gout flare or significant osteoarthritis. Patient knows that surgery is an options but prefers to avoid surgery.  P:  1. Tramadol refilled. 2. Gabapentin 100 mg nightly for 3 nights, 200 mg for 3 nights then 300 mg  3. Referral to physical therapy. 4. Continue to wear wrist splint

## 2014-09-19 ENCOUNTER — Encounter (HOSPITAL_COMMUNITY): Payer: Self-pay | Admitting: Emergency Medicine

## 2014-09-19 ENCOUNTER — Emergency Department (HOSPITAL_COMMUNITY)
Admission: EM | Admit: 2014-09-19 | Discharge: 2014-09-19 | Disposition: A | Discharge status: Home / Self Care | Payer: Commercial Managed Care - HMO | Attending: Emergency Medicine | Admitting: Emergency Medicine

## 2014-09-19 DIAGNOSIS — Z87891 Personal history of nicotine dependence: Secondary | ICD-10-CM | POA: Diagnosis not present

## 2014-09-19 DIAGNOSIS — M25531 Pain in right wrist: Secondary | ICD-10-CM | POA: Diagnosis present

## 2014-09-19 DIAGNOSIS — Z79899 Other long term (current) drug therapy: Secondary | ICD-10-CM | POA: Insufficient documentation

## 2014-09-19 DIAGNOSIS — G5601 Carpal tunnel syndrome, right upper limb: Secondary | ICD-10-CM | POA: Diagnosis not present

## 2014-09-19 DIAGNOSIS — E119 Type 2 diabetes mellitus without complications: Secondary | ICD-10-CM | POA: Insufficient documentation

## 2014-09-19 DIAGNOSIS — M1990 Unspecified osteoarthritis, unspecified site: Secondary | ICD-10-CM | POA: Insufficient documentation

## 2014-09-19 DIAGNOSIS — Z793 Long term (current) use of hormonal contraceptives: Secondary | ICD-10-CM | POA: Diagnosis not present

## 2014-09-19 DIAGNOSIS — I10 Essential (primary) hypertension: Secondary | ICD-10-CM | POA: Diagnosis not present

## 2014-09-19 HISTORY — DX: Carpal tunnel syndrome, unspecified upper limb: G56.00

## 2014-09-19 NOTE — ED Notes (Signed)
Patient states has been diagnosed with carpal tunnel problems by her PCP.   Patient states she saw her last week and was given gabapentin and tramadol.   Patient states doesn't work.   Patient here for pain management.

## 2014-09-19 NOTE — Discharge Instructions (Signed)
Carpal Tunnel Syndrome The carpal tunnel is a narrow area located on the palm side of your wrist. The tunnel is formed by the wrist bones and ligaments. Nerves, blood vessels, and tendons pass through the carpal tunnel. Repeated wrist motion or certain diseases may cause swelling within the tunnel. This swelling pinches the main nerve in the wrist (median nerve) and causes the painful hand and arm condition called carpal tunnel syndrome. CAUSES   Repeated wrist motions.  Wrist injuries.  Certain diseases like arthritis, diabetes, alcoholism, hyperthyroidism, and kidney failure.  Obesity.  Pregnancy. SYMPTOMS   A "pins and needles" feeling in your fingers or hand, especially in your thumb, index and middle fingers.  Tingling or numbness in your fingers or hand.  An aching feeling in your entire arm, especially when your wrist and elbow are bent for long periods of time.  Wrist pain that goes up your arm to your shoulder.  Pain that goes down into your palm or fingers.  A weak feeling in your hands. DIAGNOSIS  Your health care provider will take your history and perform a physical exam. An electromyography test may be needed. This test measures electrical signals sent out by your nerves into the muscles. The electrical signals are usually slowed by carpal tunnel syndrome. You may also need X-rays. TREATMENT  Carpal tunnel syndrome may clear up by itself. Your health care provider may recommend a wrist splint or medicine such as a nonsteroidal anti-inflammatory medicine. Cortisone injections may help. Sometimes, surgery may be needed to free the pinched nerve.  HOME CARE INSTRUCTIONS   Take all medicine as directed by your health care provider. Only take over-the-counter or prescription medicines for pain, discomfort, or fever as directed by your health care provider.  If you were given a splint to keep your wrist from bending, wear it as directed. It is important to wear the splint at  night. Wear the splint for as long as you have pain or numbness in your hand, arm, or wrist. This may take 1 to 2 months.  Rest your wrist from any activity that may be causing your pain. If your symptoms are work-related, you may need to talk to your employer about changing to a job that does not require using your wrist.  Put ice on your wrist after long periods of wrist activity.  Put ice in a plastic bag.  Place a towel between your skin and the bag.  Leave the ice on for 15-20 minutes, 03-04 times a day.  Keep all follow-up visits as directed by your health care provider. This includes any orthopedic referrals, physical therapy, and rehabilitation. Any delay in getting necessary care could result in a delay or failure of your condition to heal. SEEK IMMEDIATE MEDICAL CARE IF:   You have new, unexplained symptoms.  Your symptoms get worse and are not helped or controlled with medicines. MAKE SURE YOU:   Understand these instructions.  Will watch your condition.  Will get help right away if you are not doing well or get worse. Document Released: 05/22/2000 Document Revised: 10/09/2013 Document Reviewed: 04/10/2011 [facility] Patient Information 2015 [place], [place]. This information is not intended to replace advice given to you by your health care provider. Make sure you discuss any questions you have with your health care provider.  Carpal Tunnel Syndrome Carpal tunnel syndrome is a disorder of the nervous system in the wrist that causes pain, hand weakness, and/or loss of feeling. Carpal tunnel syndrome is caused by the  compression, stretching, or irritation of the median nerve at the wrist joint. Athletes who experience carpal tunnel syndrome may notice a decrease in their performance to the condition, especially for sports that require strong hand or wrist action.  SYMPTOMS   Tingling, numbness, or burning pain in the hand or fingers.  Inability to sleep due to pain in the  hand.  Sharp pains that shoot from the wrist up the arm or to the fingers, especially at night.  Morning stiffness or cramping of the hand.  Thumb weakness, resulting in difficulty holding objects or making a fist.  Shiny, dry skin on the hand.  Reduced performance in any sport requiring a strong grip. CAUSES   Median nerve damage at the wrist is caused by pressure due to swelling, inflammation, or scarred tissue.  Sources of pressure include:  Repetitive gripping or squeezing that causes inflammation of the tendon sheaths.  Scarring or shortening of the ligament that covers the median nerve.  Traumatic injury to the wrist or forearm such as fracture, sprain, or dislocation.  Prolonged hyperextension (wrist bent backward) or hyperflexion (wrist bent downward) of the wrist. RISK INCREASES WITH:  Diabetes mellitus.  Menopause or amenorrhea.  Rheumatoid arthritis.  Raynaud disease.  Pregnancy.  Gout.  Kidney disease.  Ganglion cyst.  Repetitive hand or wrist action.  Hypothyroidism (underactive thyroid gland).  Repetitive jolting or shaking of the hands or wrist.  Prolonged forceful weight-bearing on the hands. PREVENTION  Bracing the hand and wrist straight during activities that involve repetitive grasping.  For activities that require prolonged extension of the wrist (bending towards the top of the forearm) periodically change the position of your wrists.  Learn and use proper technique in activities that result in the wrist position in neutral to slight extension.  Avoid bending the wrist into full extension or flexion (up or down).  Keep the wrist in a straight (neutral) position. To keep the wrist in this position, wear a splint.  Avoid repetitive hand and wrist motions.  When possible avoid prolonged grasping of items (steering wheel of a car, a pen, a vacuum cleaner, or a rake).  Loosen your grip for activities that require prolonged grasping of  items.  Place keyboards and writing surfaces at the correct height as to decrease strain on the wrist and hand.  Alternate work tasks to avoid prolonged wrist flexion.  Avoid pinching activities (needlework and writing) as they may irritate your carpal tunnel syndrome.  If these activities are necessary, complete them for shorter periods of time.  When writing, use a felt tip or rollerball pen and/or build up the grip on a pen to decrease the forces required for writing. PROGNOSIS  Carpal tunnel syndrome is usually curable with appropriate conservative treatment and sometimes resolves spontaneously. For some cases, surgery is necessary, especially if muscle wasting or nerve changes have developed.  RELATED COMPLICATIONS   Permanent numbness and a weak thumb or fingers in the affected hand.  Permanent paralysis of a portion of the hand and fingers. TREATMENT  Treatment initially consists of stopping activities that aggravate the symptoms as well as medication and ice to reduce inflammation. A wrist splint is often recommended for wear during activities of repetitive motion as well as at night. It is also important to learn and use proper technique when performing activities that typically cause pain. On occasion, a corticosteroid injection may be given. If symptoms persist despite conservative treatment, surgery may be an option. Surgical techniques free the pinched or  compressed nerve. Carpal tunnel surgery is usually performed on an outpatient basis, meaning you go home the same day as surgery. These procedures provide almost complete relief of all symptoms in 95% of patients. Expect at least 2 weeks for healing after surgery. For cases that are the result of repeated jolting or shaking of the hand or wrist or prolonged hyperextension, surgery is not usually recommended because stretching of the median nerve, not compression, is usually the cause of carpal tunnel syndrome in these  cases. MEDICATION   If pain medication is necessary, nonsteroidal anti-inflammatory medications, such as aspirin and ibuprofen, or other minor pain relievers, such as acetaminophen, are often recommended.  Do not take pain medication for 7 days before surgery.  Prescription pain relievers are usually only prescribed after surgery. Use only as directed and only as much as you need.  Corticosteroid injections may be given to reduce inflammation. However, they are not always recommended.  Vitamin B6 (pyridoxine) may reduce symptoms; use only if prescribed for your disorder. SEEK MEDICAL CARE IF:   Symptoms get worse or do not improve in 2 weeks despite treatment.  You also have a current or recent history of neck or shoulder injury that has resulted in pain or tingling elsewhere in your arm. Document Released: 05/25/2005 Document Revised: 10/09/2013 Document Reviewed: 09/06/2008 [facility] Patient Information 2015 [place], [place]. This information is not intended to replace advice given to you by your health care provider. Make sure you discuss any questions you have with your health care provider.  Carpal Tunnel Release Carpal tunnel release is done to relieve the pressure on the nerves and tendons on the bottom side of your wrist.  LET YOUR CAREGIVER KNOW ABOUT:   Allergies to food or medicine.  Medicines taken, including vitamins, herbs, eyedrops, over-the-counter medicines, and creams.  Use of steroids (by mouth or creams).  Previous problems with anesthetics or numbing medicines.  History of bleeding problems or blood clots.  Previous surgery.  Other health problems, including diabetes and kidney problems.  Possibility of pregnancy, if this applies. RISKS AND COMPLICATIONS  Some problems that may happen after this procedure include:  Infection.  Damage to the nerves, arteries or tendons could occur. This would be very uncommon.  Bleeding. BEFORE THE PROCEDURE   This  surgery may be done while you are asleep (general anesthetic) or may be done under a block where only your forearm and the surgical area is numb.  If the surgery is done under a block, the numbness will gradually wear off within several hours after surgery. HOME CARE INSTRUCTIONS   Have a responsible person with you for 24 hours.  Do not drive a car or use public transportation for 24 hours.  Only take over-the-counter or prescription medicines for pain, discomfort, or fever as directed by your caregiver. Take them as directed.  You may put ice on the palm side of the affected wrist.  Put ice in a plastic bag.  Place a towel between your skin and the bag.  Leave the ice on for 20 to 30 minutes, 4 times per day.  If you were given a splint to keep your wrist from bending, use it as directed. It is important to wear the splint at night or as directed. Use the splint for as long as you have pain or numbness in your hand, arm, or wrist. This may take 1 to 2 months.  Keep your hand raised (elevated) above the level of your heart as much  as possible. This keeps swelling down and helps with discomfort.  Change bandages (dressings) as directed.  Keep the wound clean and dry. SEEK MEDICAL CARE IF:   You develop pain not relieved with medications.  You develop numbness of your hand.  You develop bleeding from your surgical site.  You have an oral temperature above 102 F (38.9 C).  You develop redness or swelling of the surgical site.  You develop new, unexplained problems. SEEK IMMEDIATE MEDICAL CARE IF:   You develop a rash.  You have difficulty breathing.  You develop any reaction or side effects to medications given. Document Released: 08/15/2003 Document Revised: 08/17/2011 Document Reviewed: 03/31/2007 [facility] Patient Information 2015 [place], [place]. This information is not intended to replace advice given to you by your health care provider. Make sure you discuss any  questions you have with your health care provider.

## 2014-09-19 NOTE — ED Provider Notes (Signed)
CSN: [id number]     Arrival date & time 09/19/14  0802 History   First MD Initiated Contact with Patient 09/19/14 [phone]     Chief Complaint  Patient presents with  . Hand Pain  . Wrist Pain     (Consider location/radiation/quality/duration/timing/severity/associated sxs/prior Treatment) HPI Comments: Patient presents to the ED with a chief complaint of right hand pain.  She states that the pain has been ongoing for longer than a month.  She states that she saw her doctor last Monday for the same thing.  She states that she was given gabapentin and tramadol with no relief.  She has been using a wrist splint.  She has an appointment with PT on Monday.  She states that her pain is constant.  She has not tried taking any other medications for relief.  It is aggravated with movement of the wrist.  The history is provided by the patient. No language interpreter was used.    Past Medical History  Diagnosis Date  . Arthritis Dx 2006  . Diabetes mellitus without complication Dx 5427  . Hypertension Dx 2005  . Carpal tunnel syndrome    Past Surgical History  Procedure Laterality Date  . Appendectomy     Family History  Problem Relation Age of Onset  . Diabetes Mother   . Diabetes Sister    History  Substance Use Topics  . Smoking status: Former Research scientist (life sciences)  . Smokeless tobacco: Never Used  . Alcohol Use: No   OB History    Gravida Para Term Preterm AB TAB SAB Ectopic Multiple Living   3 3 3  0 0 0 0 0 0 3     Review of Systems  Constitutional: Negative for fever and chills.  Respiratory: Negative for shortness of breath.   Cardiovascular: Negative for chest pain.  Gastrointestinal: Negative for nausea, vomiting, diarrhea and constipation.  Genitourinary: Negative for dysuria.  Musculoskeletal: Positive for arthralgias.      Allergies  Lisinopril and Naproxen  Home Medications   Prior to Admission medications   Medication Sig Start Date End Date Taking? Authorizing Provider   allopurinol (ZYLOPRIM) 300 MG tablet Take 1 tablet (300 mg total) by mouth daily. 07/26/14   Josalyn Funches, MD  amLODipine (NORVASC) 10 MG tablet Take 0.5 tablets (5 mg total) by mouth daily. 07/26/14   Josalyn Funches, MD  atenolol (TENORMIN) 25 MG tablet Take 1 tablet (25 mg total) by mouth daily. 07/26/14   Josalyn Funches, MD  atorvastatin (LIPITOR) 20 MG tablet Take 1 tablet (20 mg total) by mouth daily. 07/26/14   Josalyn Funches, MD  calcium carbonate (OS-CAL - DOSED IN MG OF ELEMENTAL CALCIUM) 1250 MG tablet Take 1 tablet (500 mg of elemental calcium total) by mouth daily with breakfast. 04/18/14   Boykin Nearing, MD  Cholecalciferol (VITAMIN D3) 400 UNITS tablet Take 1 tablet (400 Units total) by mouth daily. 04/18/14   Josalyn Funches, MD  colchicine 0.6 MG tablet Take 1 tablet (0.6 mg total) by mouth 2 (two) times daily. 06/21/14   Josalyn Funches, MD  estradiol (ESTRACE VAGINAL) 0.1 MG/GM vaginal cream Place 1 Applicatorful vaginally at bedtime. 01/03/14   Peggy Constant, MD  gabapentin (NEURONTIN) 100 MG capsule Take 1-3 capsules (100-300 mg total) by mouth at bedtime. 09/10/14   Boykin Nearing, MD  indomethacin (INDOCIN) 50 MG capsule Take 1 capsule (50 mg total) by mouth 2 (two) times daily with a meal. 04/18/14   Josalyn Funches, MD  metFORMIN (GLUCOPHAGE XR) 500 MG  24 hr tablet Take 2 tablets (1,000 mg total) by mouth daily after supper. 07/26/14   Josalyn Funches, MD  traMADol (ULTRAM) 50 MG tablet Take 1 tablet (50 mg total) by mouth every 8 (eight) hours as needed for severe pain (pain in R hand and wrist). 09/10/14   Josalyn Funches, MD  vitamin E 400 UNIT capsule Take 400 Units by mouth daily.    Historical Provider, MD  zoster vaccine live, PF, (ZOSTAVAX) 05110 UNT/0.65ML injection Inject 19,400 Units into the skin once. Patient not taking: Reported on 07/26/2014 04/13/14   Josalyn Funches, MD   BP 160/105 mmHg  Pulse 71  Temp(Src) 97.5 F (36.4 C) (Oral)  Resp 18  Ht 5\' 10"   (1.778 m)  Wt 235 lb (106.595 kg)  BMI 33.72 kg/m2  SpO2 100% Physical Exam  Constitutional: She is oriented to person, place, and time. She appears well-developed and well-nourished.  HENT:  Head: Normocephalic and atraumatic.  Eyes: Conjunctivae and EOM are normal.  Neck: Normal range of motion.  Cardiovascular: Normal rate.   Pulmonary/Chest: Effort normal.  Abdominal: She exhibits no distension.  Musculoskeletal: Normal range of motion.  Pain with wrist extension, positive tinel sign No bony abnormality  Neurological: She is alert and oriented to person, place, and time.  Skin: Skin is dry.  No erythema  Psychiatric: She has a normal mood and affect. Her behavior is normal. Judgment and thought content normal.  Nursing note and vitals reviewed.   ED Course  Procedures (including critical care time) Labs Review Labs Reviewed - No data to display  Imaging Review No results found.   EKG Interpretation None      MDM   Final diagnoses:  Carpal tunnel syndrome of right wrist    Patient with diagnosis of carpal tunnel.  She is being treated by PCP for the same.  Scheduled to see PT.  She is requesting stronger pain medications.  Per chronic pain policy, no new prescriptions in ED, but did strongly recommend conservative treatment with wrist brace, stretching, ice massage and PT.  Recommend hand follow-up if symptoms do not improve with conservative care.  Discussed that injections and possible surgery would be needed to correct the problem.  Patient frustrated by chronic pain policy, but agrees with follow-up plan.   Montine Circle, PA-C 09/19/14 2111  Wandra Arthurs, MD 09/20/14 [phone]

## 2014-09-24 ENCOUNTER — Other Ambulatory Visit: Payer: Self-pay | Admitting: *Deleted

## 2014-09-24 ENCOUNTER — Telehealth: Payer: Self-pay | Admitting: *Deleted

## 2014-09-24 ENCOUNTER — Ambulatory Visit: Payer: Commercial Managed Care - HMO | Attending: Family Medicine | Admitting: Occupational Therapy

## 2014-09-24 ENCOUNTER — Encounter: Payer: Self-pay | Admitting: Occupational Therapy

## 2014-09-24 DIAGNOSIS — M79641 Pain in right hand: Secondary | ICD-10-CM | POA: Diagnosis not present

## 2014-09-24 DIAGNOSIS — M25531 Pain in right wrist: Secondary | ICD-10-CM

## 2014-09-24 DIAGNOSIS — R29898 Other symptoms and signs involving the musculoskeletal system: Secondary | ICD-10-CM

## 2014-09-24 DIAGNOSIS — M6289 Other specified disorders of muscle: Secondary | ICD-10-CM | POA: Diagnosis not present

## 2014-09-24 NOTE — Telephone Encounter (Signed)
Stacy from [facility] neuro rehab called to say patient was there for rehab and needed occupational therapy instead of the ordered physical therapy.  She needed a verbal order to proceed.  I put order in.

## 2014-09-24 NOTE — Therapy (Signed)
[facility] [address] [place], [place], [address] Phone: [phone]   Fax:  [phone]  Occupational Therapy Evaluation  Patient Details  Name: Desiree Sanchez MRN: [id number] Date of Birth: 22-Apr-1951 Referring Provider:  Boykin Nearing, MD  Encounter Date: 09/24/2014      OT End of Session - 09/24/14 1247    Visit Number 1   Number of Visits 9   Date for OT Re-Evaluation 10/24/14   Authorization Type [employer]   Authorization Time Period Awaiting [employer] approval   OT Start Time 1015   OT Stop Time 1100   OT Time Calculation (min) 45 min   Activity Tolerance Patient tolerated treatment well      Past Medical History  Diagnosis Date  . Arthritis Dx 2006  . Diabetes mellitus without complication Dx 3976  . Hypertension Dx 2005  . Carpal tunnel syndrome     Past Surgical History  Procedure Laterality Date  . Appendectomy      There were no vitals filed for this visit.  Visit Diagnosis:  Pain in joint, hand, right - Plan: Ot plan of care cert/re-cert  Weakness of right hand - Plan: Ot plan of care cert/re-cert      Subjective Assessment - 09/24/14 1023    Subjective  "My symptoms started in January of this year"    Pertinent History CTS Rt hand, arthritis, DM, HTN   Repetition Increases Symptoms   Patient Stated Goals I just want the pain to go away   Currently in Pain? Yes   Pain Score 10-Worst pain ever  8/10 is the lowest   Pain Location Hand   Pain Descriptors / Indicators Tingling;Numbness;Throbbing;Burning;Radiating   Pain Type Neuropathic pain   Pain Onset More than a month ago   Pain Frequency Constant   Aggravating Factors  night time, use   Pain Relieving Factors nothing           [facility] OT Assessment - 09/24/14 1035    Assessment   Diagnosis CTS, Rt dominant hand   Onset Date --  January 2016   Prior Therapy none   Precautions   Precautions None   Required Braces or  Orthoses Other Brace/Splint  for pain prn   Other Brace/Splint D-ring style splint issued by MD   Restrictions   Weight Bearing Restrictions No   Balance Screen   Has the patient fallen in the past 6 months No   Has the patient had a decrease in activity level because of a fear of falling?  No   Is the patient reluctant to leave their home because of a fear of falling?  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 Pt has decreased ability to cook and clean and has modified tasks prn.    Written Expression   Dominant Hand Right   Handwriting Increased time;90% legible  Sensation   Additional Comments Pt reports numbness first 3 digits volarly consistent with CTS   Coordination   9 Hole Peg Test Right;Left   Right 9 Hole Peg Test 39.72 sec.    Left 9 Hole Peg Test 24.32 sec.    Edema   Edema Pt initially had edema on dorsal part of hand that resembled gout in January when symptoms began. Pt still has residual mild edema between 2nd and 3rd metacarpal joint dorsally. ? cyst   ROM / Strength   AROM / PROM / Strength AROM   AROM   Overall AROM Comments Pt has full composite flexion and thumb opposition, however lacks full PIP extension at 3rd digit. Noted stiffness in 2nd and 3rd PIP joints.    Hand Function   Right Hand Grip (lbs) 16 lbs   Right Hand Lateral Pinch 7.5 lbs   Right Hand 3 Point Pinch 3 lbs   Left Hand Grip (lbs) 50 lbs   Left  Hand Lateral Pinch 14 lbs   Left 3 point pinch 11 lbs                         OT Education - 09/24/14 1303    Education provided Yes   Education Details positions to avoid (including prolonged sustained grip and/or repetitive gripping), stretch to carpal tunnel    Person(s) Educated Patient   Methods Explanation;Demonstration   Comprehension Verbalized understanding             OT Long Term Goals - 09/24/14 1307    OT LONG TERM GOAL #1   Title Independent w/ HEP (due 10/24/14)   Time 4   Period Weeks   Status New   OT LONG TERM GOAL #2   Title Verbalize understanding with pain management strategies, A/E recommendations and task modifications   Time 4   Period Weeks   Status New   OT LONG TERM GOAL #3   Title Pain less than or equal to 5/10 with functional activities   Baseline 8-10/10    Time 4   Period Weeks   Status New   OT LONG TERM GOAL #4   Title Grip strength to improve by 10 lbs or greater Rt dominant hand   Baseline eval = 16 lbs (Lt = 50 lbs)   Time 4   Period Weeks   Status New   OT LONG TERM GOAL #5   Title Improve lateral and 3 jaw pinch Rt hand by 3 lbs or greater   Baseline Lateral = 7.5 lbs, 3 jaw pinch = 3 lbs   Time 4   Period Weeks   Status New               Plan - 09/24/14 1304    Clinical Impression Statement Pt is a 64 y.o. female who presents to outpatient rehab with CTS of Rt dominant hand. Pt has had severe pain which began in January 2016 after extreme edema dorsally in hand.    Pt will benefit from skilled therapeutic intervention in order to improve on the following deficits (Retired) Decreased coordination;Decreased range of motion;Increased edema;Impaired sensation;Decreased knowledge of precautions;Impaired UE functional use;Pain;Decreased strength;Impaired flexibility   OT Frequency 2x / week   OT Duration 4 weeks  plus evaluation   OT Treatment/Interventions Fluidtherapy;Moist Heat;Self-care/ADL  training;DME and/or AE instruction;Splinting;Patient/family education;Therapeutic exercises;Ultrasound;Therapeutic activities;Passive range of motion;Parrafin;Manual Therapy;Energy conservation   Plan CTS conservative management handout and review  G-Codes - 09/24/14 1314    Functional Assessment Tool Used Rt dominant hand: grip strength = 16 lbs, 9 hole peg test = 39.72 sec., pain 8-10/10   Functional Limitation Carrying, moving and handling objects   Carrying, Moving and Handling Objects Current Status (B3435) At least 60 percent but less than 80 percent impaired, limited or restricted   Carrying, Moving and Handling Objects Goal Status (W8616) At least 20 percent but less than 40 percent impaired, limited or restricted      Problem List Patient Active Problem List   Diagnosis Date Noted  . Right carpal tunnel syndrome 07/29/2014  . Healthcare maintenance 04/13/2014  . Vitamin D insufficiency 04/13/2014  . Obesity (BMI 30-39.9) 03/13/2013  . Gout 05/20/2009  . HEART MURMUR, SYSTOLIC 83/72/9021  . DM2 (diabetes mellitus, type 2) 04/22/2009  . Essential hypertension 04/22/2009    Carey Bullocks, OTR/L 09/24/2014, 1:17 PM  [facility] [address] [place], [place], [address] Phone: [phone]   Fax:  [phone]

## 2014-10-05 ENCOUNTER — Ambulatory Visit: Payer: Commercial Managed Care - HMO | Attending: Family Medicine | Admitting: Family Medicine

## 2014-10-05 ENCOUNTER — Encounter: Payer: Self-pay | Admitting: Family Medicine

## 2014-10-05 VITALS — BP 134/81 | HR 65 | Temp 98.8°F | Resp 18 | Ht 70.5 in | Wt 227.0 lb

## 2014-10-05 DIAGNOSIS — G5601 Carpal tunnel syndrome, right upper limb: Secondary | ICD-10-CM

## 2014-10-05 DIAGNOSIS — E1121 Type 2 diabetes mellitus with diabetic nephropathy: Secondary | ICD-10-CM

## 2014-10-05 DIAGNOSIS — I10 Essential (primary) hypertension: Secondary | ICD-10-CM

## 2014-10-05 LAB — GLUCOSE, POCT (MANUAL RESULT ENTRY): POC Glucose: 113 mg/dl — AB (ref 70–99)

## 2014-10-05 MED ORDER — TRAMADOL HCL 50 MG PO TABS: 50.0000 mg | ORAL_TABLET | Freq: Three times a day (TID) | ORAL | Status: DC | PRN

## 2014-10-05 NOTE — Progress Notes (Signed)
   Subjective:    Patient ID: Desiree Sanchez, female    DOB: May 15, 1951, 64 y.o.   MRN: [id number] CC: f.u R carpal tunnel  HPI  1. R wrist carpal tunnel: x 2 months. Patient has hx of gout but uric acid wnl and no significant joint swelling. Since last visit, patient is unable to start occupational therapy due to insurance limitations. Still having pain in R radial wrist and first 3 fingers with numbness. Pain interfefres with rest.gabapentin did not help. She tried up to 300 mg nightly. Tramadol helps a bit. She is amenable to carpal tunnel injections.   2. DM2: compliant with metformin. No low CBGs. Eating a healthy, low carb diet.  3.HTN: cmpliant with norvasc. No LE edema. No CP or SOB.   Soc Hx: non smoker  Review of Systems Negative fever or chills Positive arthralgia      Objective:   Physical Exam BP 134/81 mmHg  Pulse 65  Temp(Src) 98.8 F (37.1 C) (Oral)  Resp 18  Ht 5' 10.5" (1.791 m)  Wt 227 lb (102.967 kg)  BMI 32.10 kg/m2  SpO2 99% General appearance: alert, cooperative and no distress  Lungs: normal WOB  Extremities: normal R radial pulses, Full ROM R wrist, no atrophy, no edema   Lab Results  Component Value Date   HGBA1C 6.20 07/26/2014   CBG 113 Last uric acid 6.4     Assessment & Plan:

## 2014-10-05 NOTE — Progress Notes (Signed)
F/U Carpal tunnel Stated needs pain medication refills Unable to go to PT due to Insurance denial

## 2014-10-05 NOTE — Patient Instructions (Signed)
Desiree Sanchez,   1. Carpal tunnel; Referral to sports medicine Refilled tramadol Wear wrist splints at least 8 hrs a day Tramadol refilled  2. Diabetes: Excellent blood sugar Continue current regimen  F/u in 6 weeks, sooner if needed  Dr. Adrian Blackwater

## 2014-10-06 NOTE — Assessment & Plan Note (Signed)
Diabetes: Excellent blood sugar Lab Results  Component Value Date   HGBA1C 6.20 07/26/2014    Continue current regimen

## 2014-10-06 NOTE — Assessment & Plan Note (Signed)
A: BP well controlled  P: Continue current regimen Prn indocin

## 2014-10-06 NOTE — Assessment & Plan Note (Signed)
Carpal tunnel; Referral to sports medicine for possible US guided carpal tunnel injections  Refilled tramadol Wear wrist splints at least 8 hrs a day

## 2014-10-09 ENCOUNTER — Ambulatory Visit: Payer: Commercial Managed Care - HMO | Admitting: Occupational Therapy

## 2014-10-11 ENCOUNTER — Encounter: Payer: Commercial Managed Care - HMO | Admitting: Occupational Therapy

## 2014-10-16 ENCOUNTER — Ambulatory Visit (INDEPENDENT_AMBULATORY_CARE_PROVIDER_SITE_OTHER): Payer: Commercial Managed Care - HMO | Admitting: Sports Medicine

## 2014-10-16 ENCOUNTER — Encounter: Payer: Commercial Managed Care - HMO | Admitting: Occupational Therapy

## 2014-10-16 ENCOUNTER — Encounter: Payer: Self-pay | Admitting: Sports Medicine

## 2014-10-16 ENCOUNTER — Telehealth: Payer: Self-pay | Admitting: Family Medicine

## 2014-10-16 VITALS — BP 163/71 | HR 60 | Ht 70.0 in | Wt 235.0 lb

## 2014-10-16 DIAGNOSIS — G5601 Carpal tunnel syndrome, right upper limb: Secondary | ICD-10-CM | POA: Diagnosis not present

## 2014-10-16 NOTE — Telephone Encounter (Signed)
Patient dropped off paper work to be filled out by her PCP, patient needs to be sent to Neurology but needs a [employer] referral that needs pre authorization. Form was place in PCP's folder, please contact patient when form is finished.

## 2014-10-16 NOTE — Telephone Encounter (Signed)
[facility]  Referral # [phone]

## 2014-10-16 NOTE — Progress Notes (Signed)
   Subjective:    Patient ID: Desiree Sanchez, female    DOB: 21-Feb-1951, 64 y.o.   MRN: [id number]  HPI  Chief complaint: right wrist and hand pain   Very pleasant 64 year old female comes in today complaining of right hand pain and swelling since January of this year. Her symptoms initially began as swelling across the dorsum of her hand in January. In addition to the swelling she also had pain. Symptoms lasted for about 3 days before beginning to resolve. Afterwards, she continued to improve until about one month later when she began to develop numbness, tingling, and burning pain into the palm of her hand and into her second and third fingers. Since then she has had a constant numbness in her fingers. Pain has been constant as well. She has an old off-the-shelf cockup wrist brace that she has been using at night but it is uncomfortable. Her symptoms are worse at night. She has been placed on tramadol as well as Neurontin without any symptom relief. She denies pain or numbness more proximally in the elbow or forearm. She does get occasional weakness. She had x-rays of both her hand and wrist done in February and both were unremarkable. She was most recently seen by Dr. Adrian Blackwater who has referred her over for ultrasound evaluation and potential cortisone injections. She denies similar problems in the past. She does have a history of gout. No prior hand or wrist surgeries. She was referred to occupational therapy but was only able to attend 1 visit.   Past medical history reviewed  Medications reviewed  Allergies reviewed    Review of Systems     As above Objective:   Physical Exam  well-developed, well-nourished. No acute distress. Awake alert and oriented 3. Vital signs reviewed   right hand: there is a mild amount of residual swelling concentrated between the second and third metacarpal heads on the dorsum of her hand. No erythema. Not warm to touch. Patient is able to make a full fist without  difficulty.   Right wrist: positive Tinel's at the carpal tunnel. Positive Phalen's. No atrophy. No weakness.. Full wrist range of motion. No wrist effusion. Good radial and ulnar pulses.   MS[facility] ultrasound of the right wrist was performed. Median nerve was visualized and appears to be enlarged. Exact measurement was not obtained. Images were not saved.       Assessment & Plan:   right wrist carpal tunnel syndrome   This is an interesting case. Her symptoms all began with swelling on the dorsum of her hand back in January. I question whether or not this may have been an acute gouty attack given her history. Her carpal tunnel syndrome symptoms did not develop until about a month later. I think she needs a more comfortable night splint. We placed her into a thumb spica brace which she found more comfortable then the simple cockup wrist brace. I would like to get an EMG/nerve conduction study to evaluate the severity of her carpal tunnel syndrome prior to considering other treatment. If severe, especially with symptoms starting after this bizarre episode of dorsal hand swelling, I may consider referral to one of the hand specialists for their input. I will call her with those results once available at which point we will delineate further treatment.

## 2014-10-18 ENCOUNTER — Encounter: Payer: Commercial Managed Care - HMO | Admitting: Occupational Therapy

## 2014-10-23 ENCOUNTER — Encounter: Payer: Commercial Managed Care - HMO | Admitting: Occupational Therapy

## 2014-10-25 ENCOUNTER — Encounter: Payer: Commercial Managed Care - HMO | Admitting: Occupational Therapy

## 2014-10-30 ENCOUNTER — Encounter: Payer: Commercial Managed Care - HMO | Admitting: Occupational Therapy

## 2014-11-01 ENCOUNTER — Encounter: Payer: Commercial Managed Care - HMO | Admitting: Occupational Therapy

## 2015-01-03 ENCOUNTER — Encounter: Payer: Self-pay | Admitting: Family Medicine

## 2015-01-03 ENCOUNTER — Ambulatory Visit: Payer: Commercial Managed Care - HMO | Attending: Family Medicine | Admitting: Family Medicine

## 2015-01-03 VITALS — BP 130/75 | HR 68 | Temp 98.5°F | Resp 18 | Ht 70.5 in | Wt 232.0 lb

## 2015-01-03 DIAGNOSIS — E119 Type 2 diabetes mellitus without complications: Secondary | ICD-10-CM | POA: Diagnosis present

## 2015-01-03 DIAGNOSIS — E1121 Type 2 diabetes mellitus with diabetic nephropathy: Secondary | ICD-10-CM | POA: Diagnosis not present

## 2015-01-03 DIAGNOSIS — Z0000 Encounter for general adult medical examination without abnormal findings: Secondary | ICD-10-CM

## 2015-01-03 DIAGNOSIS — Z87891 Personal history of nicotine dependence: Secondary | ICD-10-CM | POA: Diagnosis not present

## 2015-01-03 DIAGNOSIS — M109 Gout, unspecified: Secondary | ICD-10-CM

## 2015-01-03 DIAGNOSIS — G5600 Carpal tunnel syndrome, unspecified upper limb: Secondary | ICD-10-CM | POA: Diagnosis not present

## 2015-01-03 DIAGNOSIS — I10 Essential (primary) hypertension: Secondary | ICD-10-CM | POA: Insufficient documentation

## 2015-01-03 DIAGNOSIS — G5601 Carpal tunnel syndrome, right upper limb: Secondary | ICD-10-CM | POA: Diagnosis not present

## 2015-01-03 LAB — GLUCOSE, POCT (MANUAL RESULT ENTRY): POC GLUCOSE: 169 mg/dL — AB (ref 70–99)

## 2015-01-03 LAB — POCT GLYCOSYLATED HEMOGLOBIN (HGB A1C): Hemoglobin A1C: 6.4

## 2015-01-03 MED ORDER — TRAMADOL HCL 50 MG PO TABS: 50.0000 mg | ORAL_TABLET | Freq: Three times a day (TID) | ORAL | Status: DC | PRN

## 2015-01-03 MED ORDER — ATENOLOL 50 MG PO TABS: 50.0000 mg | ORAL_TABLET | Freq: Every day | ORAL | Status: DC

## 2015-01-03 MED ORDER — IBUPROFEN 800 MG PO TABS: 800.0000 mg | ORAL_TABLET | Freq: Three times a day (TID) | ORAL | Status: DC | PRN

## 2015-01-03 MED ORDER — PNEUMOCOCCAL VAC POLYVALENT 25 MCG/0.5ML IJ INJ
0.5000 mL | INJECTION | Freq: Once | INTRAMUSCULAR | Status: AC
  Administered 2015-01-03: 0.5 mL via INTRAMUSCULAR

## 2015-01-03 NOTE — Assessment & Plan Note (Signed)
A: gout, compliant with allopurinol, last flare in feet was 2 months ago P: Check uric acid level, CBC, CMP Continue allopurinol and titrate up if needed for goal uric acid < 6 Tramadol and ibuprofen ordered prn pain

## 2015-01-03 NOTE — Progress Notes (Signed)
F/U referral neurology Question medication reaction  Medicine refill  No hx tobacco

## 2015-01-03 NOTE — Patient Instructions (Addendum)
Ms. Ogan,  Thank you for coming in today  1. HTN: Stop norvasc Increase atenolol to 50 mg once daily   2. Diabetes: well controlled Continue current regimen  3. Gout and R wrist pain: Improving Checking uric acid along with CBC and CMP May increase allopurinol dose of needed  For pain control when needed: Ibuprofen 800 mg Tramadol refilled   F/u in 4 weeks with RN for flu shot F/u with me in 3 months   Dr. Adrian Blackwater

## 2015-01-03 NOTE — Progress Notes (Signed)
Subjective:    Patient ID: Desiree Sanchez, female    DOB: January 14, 1951, 64 y.o.   MRN: [id number] CC: f/u DM2, HTN, gout  HPI 64 yo F presents for f./u appt    1. CHRONIC HYPERTENSION  Disease Monitoring  Blood pressure range: not checking   Chest pain: no   Dyspnea: no   Claudication: no   Medication compliance: yes  Medication Side Effects  Lightheadedness: no   Urinary frequency: no   Edema: yes in feet    Preventitive Healthcare:  Exercise: yes   Diet Pattern: regular meals   Salt Restriction: yes   2. CHRONIC DIABETES  Disease Monitoring  Blood Sugar Ranges: < 160 post prandial   Polyuria: no   Visual problems: no   Medication Compliance: yes  Medication Side Effects  Hypoglycemia: no   3. Gout: went to [facility]. Was informed that pain and swelling in R hand and wrist was most likely a gout flare. Was referred to neurology for nerve conduction testing. Reports improvement in pain and swelling. Would like to cancel nerve conduction testing. Compliant with allopurinol 300 mg daily. Ran out of tramadol. Reports that 800 mg of ibuprofen works better than indocin for gout flares and would like to take ibuprofen instead.   History  Substance Use Topics  . Smoking status: Former Research scientist (life sciences)  . Smokeless tobacco: Never Used  . Alcohol Use: No   Review of Systems  Constitutional: Negative for fever and chills.  Respiratory: Negative for shortness of breath.   Cardiovascular: Negative for chest pain.       Feet swelling, taking norvasc   Gastrointestinal: Negative for abdominal pain and blood in stool.  Musculoskeletal: Positive for arthralgias.  Skin: Negative for rash.  Psychiatric/Behavioral: Negative for suicidal ideas and dysphoric mood.       Objective:   Physical Exam BP 130/75 mmHg  Pulse 68  Temp(Src) 98.5 F (36.9 C) (Oral)  Resp 18  Ht 5' 10.5" (1.791 m)  Wt 232 lb (105.235 kg)  BMI 32.81 kg/m2  SpO2 98%  Wt Readings from Last 3 Encounters:    01/03/15 232 lb (105.235 kg)  10/16/14 235 lb (106.595 kg)  10/05/14 227 lb (102.967 kg)  General appearance: alert, cooperative and no distress Lungs: clear to auscultation bilaterally Heart: regular rate and rhythm, S1, S2 normal, no murmur, click, rub or gallop Extremities: extremities normal, atraumatic, no cyanosis or edema   Lab Results  Component Value Date   HGBA1C 6.40 01/03/2015   CBG 169      Assessment & Plan:  Breniya was seen today for gout, hypertension and diabetes.  Diagnoses and all orders for this visit:  Type 2 diabetes mellitus with diabetic nephropathy Orders: -     POCT glucose (manual entry) -     POCT glycosylated hemoglobin (Hb A1C)  Essential hypertension Orders: -     atenolol (TENORMIN) 50 MG tablet; Take 1 tablet (50 mg total) by mouth daily. -     COMPLETE METABOLIC PANEL WITH GFR  Gout without tophus, unspecified cause, unspecified chronicity, unspecified site Orders: -     Uric Acid -     traMADol (ULTRAM) 50 MG tablet; Take 1 tablet (50 mg total) by mouth every 8 (eight) hours as needed for severe pain (pain in R hand and wrist). -     CBC -     ibuprofen (ADVIL,MOTRIN) 800 MG tablet; Take 1 tablet (800 mg total) by mouth every 8 (eight) hours as  needed for moderate pain.  Right carpal tunnel syndrome Orders: -     traMADol (ULTRAM) 50 MG tablet; Take 1 tablet (50 mg total) by mouth every 8 (eight) hours as needed for severe pain (pain in R hand and wrist). -     CBC -     ibuprofen (ADVIL,MOTRIN) 800 MG tablet; Take 1 tablet (800 mg total) by mouth every 8 (eight) hours as needed for moderate pain.  Healthcare maintenance Orders: -     pneumococcal 23 valent vaccine (PNU-IMMUNE) injection 0.5 mL; Inject 0.5 mLs into the muscle once.

## 2015-01-03 NOTE — Assessment & Plan Note (Signed)
Gout and R wrist pain: Improving Checking uric acid along with CBC and CMP May increase allopurinol dose of needed  For pain control when needed: Ibuprofen 800 mg Tramadol refilled

## 2015-01-03 NOTE — Assessment & Plan Note (Signed)
A: HTN: well controlled. Foot edema with amlodipine. P: Stop norvasc Increase atenolol to 50 mg once daily

## 2015-01-03 NOTE — Assessment & Plan Note (Signed)
   Diabetes: well controlled Continue current regimen

## 2015-01-04 LAB — CBC
HCT: 32.7 % — ABNORMAL LOW (ref 36.0–46.0)
Hemoglobin: 10.7 g/dL — ABNORMAL LOW (ref 12.0–15.0)
MCH: 29.4 pg (ref 26.0–34.0)
MCHC: 32.7 g/dL (ref 30.0–36.0)
MCV: 89.8 fL (ref 78.0–100.0)
MPV: 9.6 fL (ref 8.6–12.4)
Platelets: 236 10*3/uL (ref 150–400)
RBC: 3.64 MIL/uL — AB (ref 3.87–5.11)
RDW: 16.6 % — AB (ref 11.5–15.5)
WBC: 5.8 10*3/uL (ref 4.0–10.5)

## 2015-01-04 LAB — COMPLETE METABOLIC PANEL WITH GFR
ALT: 10 U/L (ref 6–29)
AST: 15 U/L (ref 10–35)
Albumin: 4 g/dL (ref 3.6–5.1)
Alkaline Phosphatase: 83 U/L (ref 33–130)
BILIRUBIN TOTAL: 0.4 mg/dL (ref 0.2–1.2)
BUN: 16 mg/dL (ref 7–25)
CALCIUM: 9.7 mg/dL (ref 8.6–10.4)
CO2: 25 meq/L (ref 20–31)
CREATININE: 0.97 mg/dL (ref 0.50–0.99)
Chloride: 107 mEq/L (ref 98–110)
GFR, Est African American: 70 mL/min (ref >=60)
GFR, Est Non African American: 61 mL/min (ref >=60)
GLUCOSE: 123 mg/dL — AB (ref 65–99)
POTASSIUM: 4.3 meq/L (ref 3.5–5.3)
SODIUM: 141 meq/L (ref 135–146)
TOTAL PROTEIN: 7 g/dL (ref 6.1–8.1)

## 2015-01-04 LAB — URIC ACID: URIC ACID, SERUM: 3.9 mg/dL (ref 2.4–7.0)

## 2015-01-07 ENCOUNTER — Telehealth: Payer: Self-pay | Admitting: *Deleted

## 2015-01-07 NOTE — Telephone Encounter (Signed)
-----   Message from Boykin Nearing, MD sent at 01/04/2015  9:28 AM EDT ----- Uric acid is low, continue current allopurinol dose Normal CMP Slight anemia on CBC, will repeat at next OV

## 2015-01-07 NOTE — Telephone Encounter (Signed)
Pt aware of results 

## 2015-01-25 ENCOUNTER — Ambulatory Visit: Payer: Commercial Managed Care - HMO

## 2015-02-02 ENCOUNTER — Encounter: Payer: Self-pay | Admitting: Pharmacist

## 2015-04-24 ENCOUNTER — Telehealth: Payer: Self-pay

## 2015-04-24 ENCOUNTER — Other Ambulatory Visit: Payer: Self-pay | Admitting: Family Medicine

## 2015-04-24 DIAGNOSIS — I10 Essential (primary) hypertension: Secondary | ICD-10-CM

## 2015-04-24 NOTE — Telephone Encounter (Signed)
Nurse called patient, patient verified date of birth. Patient explains medication was sent to [facility] pharmacy instead of [facility]. Patient has already spoke to pharmacy and had prescription transferred with refills.  Patient aware of need for appointment for additional refills. Nurse transferred patient to front office staff to schedule appointment.

## 2015-05-23 ENCOUNTER — Ambulatory Visit: Payer: Commercial Managed Care - HMO | Admitting: Family Medicine

## 2015-05-27 ENCOUNTER — Ambulatory Visit: Payer: Commercial Managed Care - HMO | Admitting: Family Medicine

## 2015-07-09 ENCOUNTER — Ambulatory Visit: Payer: Commercial Managed Care - HMO | Attending: Family Medicine | Admitting: Family Medicine

## 2015-07-09 ENCOUNTER — Encounter: Payer: Self-pay | Admitting: Family Medicine

## 2015-07-09 VITALS — BP 190/100 | HR 61 | Temp 98.4°F | Resp 16 | Ht 70.5 in | Wt 233.0 lb

## 2015-07-09 DIAGNOSIS — Z1159 Encounter for screening for other viral diseases: Secondary | ICD-10-CM | POA: Diagnosis not present

## 2015-07-09 DIAGNOSIS — M109 Gout, unspecified: Secondary | ICD-10-CM | POA: Insufficient documentation

## 2015-07-09 DIAGNOSIS — G5601 Carpal tunnel syndrome, right upper limb: Secondary | ICD-10-CM

## 2015-07-09 DIAGNOSIS — E1121 Type 2 diabetes mellitus with diabetic nephropathy: Secondary | ICD-10-CM | POA: Diagnosis not present

## 2015-07-09 DIAGNOSIS — Z23 Encounter for immunization: Secondary | ICD-10-CM | POA: Diagnosis not present

## 2015-07-09 DIAGNOSIS — E119 Type 2 diabetes mellitus without complications: Secondary | ICD-10-CM | POA: Diagnosis present

## 2015-07-09 DIAGNOSIS — E1129 Type 2 diabetes mellitus with other diabetic kidney complication: Secondary | ICD-10-CM | POA: Diagnosis not present

## 2015-07-09 DIAGNOSIS — I10 Essential (primary) hypertension: Secondary | ICD-10-CM | POA: Diagnosis not present

## 2015-07-09 DIAGNOSIS — Z794 Long term (current) use of insulin: Secondary | ICD-10-CM | POA: Insufficient documentation

## 2015-07-09 DIAGNOSIS — Z79899 Other long term (current) drug therapy: Secondary | ICD-10-CM | POA: Insufficient documentation

## 2015-07-09 DIAGNOSIS — Z0000 Encounter for general adult medical examination without abnormal findings: Secondary | ICD-10-CM

## 2015-07-09 DIAGNOSIS — Z7984 Long term (current) use of oral hypoglycemic drugs: Secondary | ICD-10-CM | POA: Insufficient documentation

## 2015-07-09 DIAGNOSIS — Z87891 Personal history of nicotine dependence: Secondary | ICD-10-CM | POA: Diagnosis not present

## 2015-07-09 LAB — BASIC METABOLIC PANEL
BUN: 17 mg/dL (ref 7–25)
CHLORIDE: 107 mmol/L (ref 98–110)
CO2: 25 mmol/L (ref 20–31)
CREATININE: 1.2 mg/dL — AB (ref 0.50–0.99)
Calcium: 9.7 mg/dL (ref 8.6–10.4)
Glucose, Bld: 88 mg/dL (ref 65–99)
POTASSIUM: 4.9 mmol/L (ref 3.5–5.3)
SODIUM: 140 mmol/L (ref 135–146)

## 2015-07-09 LAB — GLUCOSE, POCT (MANUAL RESULT ENTRY): POC Glucose: 148 mg/dl — AB (ref 70–99)

## 2015-07-09 LAB — POCT GLYCOSYLATED HEMOGLOBIN (HGB A1C): Hemoglobin A1C: 6.2

## 2015-07-09 MED ORDER — IBUPROFEN 800 MG PO TABS
800.0000 mg | ORAL_TABLET | Freq: Three times a day (TID) | ORAL | Status: DC | PRN
Start: 2015-07-09 — End: 2015-07-09

## 2015-07-09 MED ORDER — ACCU-CHEK SOFTCLIX LANCETS MISC: 1.0000 | Freq: Every day | Status: DC

## 2015-07-09 MED ORDER — CARVEDILOL 6.25 MG PO TABS: 6.2500 mg | ORAL_TABLET | Freq: Two times a day (BID) | ORAL | Status: DC

## 2015-07-09 MED ORDER — TRAMADOL HCL 50 MG PO TABS: 50.0000 mg | ORAL_TABLET | Freq: Three times a day (TID) | ORAL | Status: DC | PRN

## 2015-07-09 MED ORDER — GLUCOSE BLOOD VI STRP: 1.0000 | ORAL_STRIP | Freq: Every day | Status: DC

## 2015-07-09 MED ORDER — ALLOPURINOL 300 MG PO TABS: 300.0000 mg | ORAL_TABLET | Freq: Every day | ORAL | Status: DC

## 2015-07-09 MED ORDER — IBUPROFEN 800 MG PO TABS: 800.0000 mg | ORAL_TABLET | Freq: Three times a day (TID) | ORAL | Status: DC | PRN

## 2015-07-09 MED ORDER — ACCU-CHEK AVIVA PLUS W/DEVICE KIT: 1.0000 | PACK | Freq: Every day | Status: DC

## 2015-07-09 MED FILL — traMADol HCL 50 MG TABS: 50 | 20 days supply | Qty: 60 | Fill #0

## 2015-07-09 MED FILL — IBUPROFEN 800 MG TABLET: 800 | 10 days supply | Qty: 30 | Fill #0

## 2015-07-09 NOTE — Patient Instructions (Addendum)
Desiree Sanchez was seen today for diabetes and hypertension.  Diagnoses and all orders for this visit:  Type 2 diabetes mellitus with diabetic nephropathy, unspecified long term insulin use status (HCC) -     POCT glycosylated hemoglobin (Hb A1C) -     POCT glucose (manual entry) -     Microalbumin/Creatinine Ratio, Urine -     Blood Glucose Monitoring Suppl (ACCU-CHEK AVIVA PLUS) w/Device KIT; 1 Device by Does not apply route daily. ICD 10 E11.21 -     glucose blood (ACCU-CHEK AVIVA PLUS) test strip; 1 each by Other route daily. ICD 10 E11.21 -     ACCU-CHEK SOFTCLIX LANCETS lancets; 1 each by Other route daily. ICD 10 E11.21  Healthcare maintenance -     Flu Vaccine QUAD 36+ mos IM  Essential hypertension -     carvedilol (COREG) 6.25 MG tablet; Take 1 tablet (6.25 mg total) by mouth 2 (two) times daily with a meal. -     Basic Metabolic Panel  Gout without tophus, unspecified cause, unspecified chronicity, unspecified site -     Discontinue: ibuprofen (ADVIL,MOTRIN) 800 MG tablet; Take 1 tablet (800 mg total) by mouth every 8 (eight) hours as needed for moderate pain. -     traMADol (ULTRAM) 50 MG tablet; Take 1 tablet (50 mg total) by mouth every 8 (eight) hours as needed for severe pain (pain in R hand and wrist). -     Basic Metabolic Panel -     allopurinol (ZYLOPRIM) 300 MG tablet; Take 1 tablet (300 mg total) by mouth daily. -     ibuprofen (ADVIL,MOTRIN) 800 MG tablet; Take 1 tablet (800 mg total) by mouth every 8 (eight) hours as needed for moderate pain.  Right carpal tunnel syndrome -     Discontinue: ibuprofen (ADVIL,MOTRIN) 800 MG tablet; Take 1 tablet (800 mg total) by mouth every 8 (eight) hours as needed for moderate pain. -     traMADol (ULTRAM) 50 MG tablet; Take 1 tablet (50 mg total) by mouth every 8 (eight) hours as needed for severe pain (pain in R hand and wrist). -     ibuprofen (ADVIL,MOTRIN) 800 MG tablet; Take 1 tablet (800 mg total) by mouth every 8 (eight) hours  as needed for moderate pain.   Diabetes blood sugar goals  Fasting (in AM before breakfast, 8 hrs of no eating or drinking (except water or unsweetened coffee or tea): 90-110 2 hrs after meals: < 160,   No low sugars: nothing < 70   F/u in 2 weeks for BP check and coreg titration with pharmacist F/u with me in 3 months   Dr. Adrian Blackwater

## 2015-07-09 NOTE — Progress Notes (Signed)
Subjective:  Patient ID: Desiree Sanchez, female    DOB: [birth year]  Age: 65 y.o. MRN: [id number]  CC: Diabetes and Hypertension   HPI KORENA NASS presents for   1. CHRONIC DIABETES  Disease Monitoring  Blood Sugar Ranges: not checking   Polyuria: no   Visual problems: no   Medication Compliance: yes  Medication Side Effects  Hypoglycemia: no   Preventitive Health Care  Eye Exam: done last year, was prescribed new glasses but did not get the Rx filled.   Foot Exam: done today     2. CHRONIC HYPERTENSION  Disease Monitoring  Blood pressure range: not checking   Chest pain: no   Dyspnea: no   Claudication: no   Medication compliance: yes  Medication Side Effects  Lightheadedness: no   Urinary frequency: no   Edema: no      Preventitive Healthcare:  Exercise: yes, walking    Diet Pattern: regular meals, few snacks   Salt Restriction: yes   3. Gout: last flare was last year. She take ibuprofen or indocin prn joint pain which resolves pain in 1-2 days. Still taking allopurinol. Also take colchicine. No joint pain or swelling currently.    Social History  Substance Use Topics  . Smoking status: Former Research scientist (life sciences)  . Smokeless tobacco: Never Used  . Alcohol Use: No    Outpatient Prescriptions Prior to Visit  Medication Sig Dispense Refill  . allopurinol (ZYLOPRIM) 300 MG tablet Take 1 tablet (300 mg total) by mouth daily. 90 tablet 1  . atenolol (TENORMIN) 50 MG tablet Take 1 tablet (50 mg total) by mouth daily. 30 tablet 11  . atorvastatin (LIPITOR) 20 MG tablet Take 1 tablet (20 mg total) by mouth daily. 90 tablet 3  . colchicine 0.6 MG tablet Take 1 tablet (0.6 mg total) by mouth 2 (two) times daily. 180 tablet 0  . gabapentin (NEURONTIN) 100 MG capsule Take 1-3 capsules (100-300 mg total) by mouth at bedtime. (Patient taking differently: Take 300 mg by mouth at bedtime. ) 90 capsule 0  . ibuprofen (ADVIL,MOTRIN) 800 MG tablet Take 1 tablet (800 mg total) by  mouth every 8 (eight) hours as needed for moderate pain. 30 tablet 2  . metFORMIN (GLUCOPHAGE XR) 500 MG 24 hr tablet Take 2 tablets (1,000 mg total) by mouth daily after supper. 180 tablet 2  . traMADol (ULTRAM) 50 MG tablet Take 1 tablet (50 mg total) by mouth every 8 (eight) hours as needed for severe pain (pain in R hand and wrist). 60 tablet 2  . calcium carbonate (OS-CAL - DOSED IN MG OF ELEMENTAL CALCIUM) 1250 MG tablet Take 1 tablet (500 mg of elemental calcium total) by mouth daily with breakfast. (Patient not taking: Reported on 07/09/2015) 90 tablet 3  . Cholecalciferol (VITAMIN D3) 400 UNITS tablet Take 1 tablet (400 Units total) by mouth daily. (Patient not taking: Reported on 07/09/2015) 90 tablet 3  . vitamin E 400 UNIT capsule Take 400 Units by mouth daily. Reported on 07/09/2015    . zoster vaccine live, PF, (ZOSTAVAX) 57322 UNT/0.65ML injection Inject 19,400 Units into the skin once. (Patient not taking: Reported on 07/09/2015) 1 each 0   No facility-administered medications prior to visit.   ROS Review of Systems  Constitutional: Negative for fever and chills.  Respiratory: Negative for shortness of breath.   Cardiovascular: Negative for chest pain and leg swelling.  Gastrointestinal: Negative for abdominal pain and blood in stool.  Musculoskeletal: Negative for arthralgias.  Skin: Negative for rash.  Psychiatric/Behavioral: Negative for suicidal ideas and dysphoric mood.    Objective:  BP 190/100 mmHg  Pulse 61  Temp(Src) 98.4 F (36.9 C) (Oral)  Resp 16  Ht 5' 10.5" (1.791 m)  Wt 233 lb (105.688 kg)  BMI 32.95 kg/m2  SpO2 99%  BP/Weight 07/09/2015 01/03/2015 9/70/2637  Systolic BP 858 850 277  Diastolic BP 412 75 71  Wt. (Lbs) 233 232 235  BMI 32.95 32.81 33.72    Physical Exam  Constitutional: She is oriented to person, place, and time. She appears well-developed and well-nourished. No distress.  HENT:  Head: Normocephalic and atraumatic.  Cardiovascular:  Normal rate, regular rhythm, normal heart sounds and intact distal pulses.   Pulmonary/Chest: Effort normal and breath sounds normal.  Musculoskeletal: She exhibits no edema.  Neurological: She is alert and oriented to person, place, and time.  Skin: Skin is warm and dry. No rash noted.  Psychiatric: She has a normal mood and affect.   Lab Results  Component Value Date   HGBA1C 6.20 07/09/2015    Assessment & Plan:   Nioka was seen today for diabetes and hypertension.  Diagnoses and all orders for this visit:  Type 2 diabetes mellitus with diabetic nephropathy, unspecified long term insulin use status (HCC) -     POCT glycosylated hemoglobin (Hb A1C) -     POCT glucose (manual entry) -     Microalbumin/Creatinine Ratio, Urine -     Blood Glucose Monitoring Suppl (ACCU-CHEK AVIVA PLUS) w/Device KIT; 1 Device by Does not apply route daily. ICD 10 E11.21 -     glucose blood (ACCU-CHEK AVIVA PLUS) test strip; 1 each by Other route daily. ICD 10 E11.21 -     ACCU-CHEK SOFTCLIX LANCETS lancets; 1 each by Other route daily. ICD 10 E11.21  Healthcare maintenance -     Flu Vaccine QUAD 36+ mos IM  Essential hypertension -     carvedilol (COREG) 6.25 MG tablet; Take 1 tablet (6.25 mg total) by mouth 2 (two) times daily with a meal. -     Basic Metabolic Panel  Gout without tophus, unspecified cause, unspecified chronicity, unspecified site -     Discontinue: ibuprofen (ADVIL,MOTRIN) 800 MG tablet; Take 1 tablet (800 mg total) by mouth every 8 (eight) hours as needed for moderate pain. -     Discontinue: traMADol (ULTRAM) 50 MG tablet; Take 1 tablet (50 mg total) by mouth every 8 (eight) hours as needed for severe pain (pain in R hand and wrist). -     Basic Metabolic Panel -     allopurinol (ZYLOPRIM) 300 MG tablet; Take 1 tablet (300 mg total) by mouth daily. -     ibuprofen (ADVIL,MOTRIN) 800 MG tablet; Take 1 tablet (800 mg total) by mouth every 8 (eight) hours as needed for moderate  pain. -     traMADol (ULTRAM) 50 MG tablet; Take 1 tablet (50 mg total) by mouth every 8 (eight) hours as needed for severe pain (pain in R hand and wrist).  Right carpal tunnel syndrome -     Discontinue: ibuprofen (ADVIL,MOTRIN) 800 MG tablet; Take 1 tablet (800 mg total) by mouth every 8 (eight) hours as needed for moderate pain. -     Discontinue: traMADol (ULTRAM) 50 MG tablet; Take 1 tablet (50 mg total) by mouth every 8 (eight) hours as needed for severe pain (pain in R hand and wrist). -     ibuprofen (ADVIL,MOTRIN) 800 MG tablet;  Take 1 tablet (800 mg total) by mouth every 8 (eight) hours as needed for moderate pain. -     traMADol (ULTRAM) 50 MG tablet; Take 1 tablet (50 mg total) by mouth every 8 (eight) hours as needed for severe pain (pain in R hand and wrist).  Need for hepatitis C screening test -     Hepatitis C antibody, reflex  Other orders -     Hepatitis C antibody   Boykin Nearing MD

## 2015-07-09 NOTE — Progress Notes (Signed)
F/U DM HTN Taking medication as prescribed  Glucose running 80-200 No pain today  No suicidal thoughts in the past two week Elevated BP at visit, Pt stated took Atenolol at 9:00

## 2015-07-09 NOTE — Progress Notes (Signed)
Patient ID: Desiree Sanchez, female   DOB: [birth year], 65 y.o.   MRN: [id number]

## 2015-07-10 LAB — MICROALBUMIN / CREATININE URINE RATIO
Creatinine, Urine: 120 mg/dL (ref 20–320)
MICROALB UR: 0.3 mg/dL
MICROALB/CREAT RATIO: 3 ug/mg{creat} (ref <30)

## 2015-07-10 LAB — HEPATITIS C ANTIBODY: HCV AB: NEGATIVE

## 2015-07-10 MED ORDER — COLCHICINE 0.6 MG PO TABS: 0.6000 mg | ORAL_TABLET | Freq: Two times a day (BID) | ORAL | Status: DC | PRN

## 2015-07-10 MED ORDER — ALLOPURINOL 100 MG PO TABS: ORAL_TABLET | ORAL | Status: DC

## 2015-07-10 NOTE — Assessment & Plan Note (Signed)
A: BP elevated Med; compliant with atenolol 50 mg daily P: D/c atenolol Start coreg 6.25 mg BID with titration every 2 weeks as tolerated by pulse to goal BP < 140/90 If HR dose not tolerated with add hydralazine

## 2015-07-10 NOTE — Assessment & Plan Note (Signed)
A; well controlled with minimal pain P: indocin or ibuprofen prn Tramadol prn

## 2015-07-10 NOTE — Assessment & Plan Note (Signed)
A: well controlled Med: complaint with metformin  P: Continue current regimen Monitor renal function

## 2015-07-10 NOTE — Assessment & Plan Note (Signed)
Well controlled Without recent flare  Plan; Taper off allopurinol from 300 mg daily to 200 mg daily for 2 weeks, then 100 mg daily for two weeks then STOP if there is no flare  colchicne prn indocin or ibuprofen prn Tramadol prn

## 2015-07-15 ENCOUNTER — Telehealth: Payer: Self-pay | Admitting: *Deleted

## 2015-07-15 NOTE — Telephone Encounter (Signed)
-----   Message from Boykin Nearing, MD sent at 07/10/2015  8:07 PM EST ----- Screening Hep C negative Cr is a bit elevated in setting of high blood pressure Plan to reduce BP Monitor Cr with repeat check in 4 weeks  Since gout is doing well, taper off allopurinol per order. Also change colchicine to prn. Be sure to take indocin or ibuprofen sparingly.

## 2015-07-15 NOTE — Telephone Encounter (Signed)
Date of birth verified by pt  Results given Screening negative  Cr elevated, will repeat in 4 weeks Plan to reduce BP Taper off Allopurinol per order,  Change colchicine to PRN. Be sure to take indocin or ibuprofen sparingly  Pt verbalized understanding

## 2015-07-23 ENCOUNTER — Ambulatory Visit: Payer: Commercial Managed Care - HMO | Admitting: Pharmacist

## 2015-08-12 MED FILL — traMADol HCL 50 MG TABS: 50 | 20 days supply | Qty: 60 | Fill #1

## 2015-09-19 MED FILL — IBUPROFEN 800 MG TABLET: 800 | 10 days supply | Qty: 30 | Fill #1

## 2015-09-19 MED FILL — traMADol HCL 50 MG TABS: 50 | 20 days supply | Qty: 60 | Fill #2

## 2015-10-22 ENCOUNTER — Ambulatory Visit: Payer: Commercial Managed Care - HMO | Attending: Family Medicine | Admitting: Pharmacist

## 2015-10-22 ENCOUNTER — Encounter: Payer: Self-pay | Admitting: Pharmacist

## 2015-10-22 VITALS — BP 139/73 | HR 70

## 2015-10-22 DIAGNOSIS — G5601 Carpal tunnel syndrome, right upper limb: Secondary | ICD-10-CM

## 2015-10-22 DIAGNOSIS — I10 Essential (primary) hypertension: Secondary | ICD-10-CM | POA: Diagnosis not present

## 2015-10-22 DIAGNOSIS — M109 Gout, unspecified: Secondary | ICD-10-CM

## 2015-10-22 MED ORDER — CARVEDILOL 6.25 MG PO TABS: 6.2500 mg | ORAL_TABLET | Freq: Two times a day (BID) | ORAL | Status: DC

## 2015-10-22 MED ORDER — IBUPROFEN 800 MG PO TABS: 800.0000 mg | ORAL_TABLET | Freq: Three times a day (TID) | ORAL | Status: DC | PRN

## 2015-10-22 NOTE — Patient Instructions (Signed)
Thanks for coming to see me!  Schedule an appointment with Dr. Adrian Blackwater  Hypertension Hypertension is another name for high blood pressure. High blood pressure forces your heart to work harder to pump blood. A blood pressure reading has two numbers, which includes a higher number over a lower number (example: 110/72). HOME CARE   Have your blood pressure rechecked by your doctor.  Only take medicine as told by your doctor. Follow the directions carefully. The medicine does not work as well if you skip doses. Skipping doses also puts you at risk for problems.  Do not smoke.  Monitor your blood pressure at home as told by your doctor. GET HELP IF:  You think you are having a reaction to the medicine you are taking.  You have repeat headaches or feel dizzy.  You have puffiness (swelling) in your ankles.  You have trouble with your vision. GET HELP RIGHT AWAY IF:   You get a very bad headache and are confused.  You feel weak, numb, or faint.  You get chest or belly (abdominal) pain.  You throw up (vomit).  You cannot breathe very well. MAKE SURE YOU:   Understand these instructions.  Will watch your condition.  Will get help right away if you are not doing well or get worse.   This information is not intended to replace advice given to you by your health care provider. Make sure you discuss any questions you have with your health care provider.   Document Released: 11/11/2007 Document Revised: 05/30/2013 Document Reviewed: 03/17/2013 Elsevier Interactive Patient Education [employer].

## 2015-10-22 NOTE — Progress Notes (Signed)
S:    Patient presents to the clinic for a blood pressure check.  Patient reports adherence with medications.  Current BP Medications include:  Carvedilol 6.25 mg BID.    O:   Last 3 Office BP readings: BP Readings from Last 3 Encounters:  10/22/15 139/73  07/09/15 190/100  01/03/15 130/75    BMET    Component Value Date/Time   NA 140 07/09/2015 1204   K 4.9 07/09/2015 1204   CL 107 07/09/2015 1204   CO2 25 07/09/2015 1204   GLUCOSE 88 07/09/2015 1204   BUN 17 07/09/2015 1204   CREATININE 1.20* 07/09/2015 1204   CREATININE 1.38* 05/20/2009 0000   CALCIUM 9.7 07/09/2015 1204   GFRNONAA 61 01/03/2015 1149   GFRAA 70 01/03/2015 1149    A/P: Hypertension longstanding currently controlled on current medications (goal BP <140/90 as patient has DM).  Continued carvedilol 6.25 mg BID. I expect the patient's blood pressure is lower at home as she endorses stress when getting her blood pressure check so there is likely a component of [place] Hypertension. Patient's blood pressure was initially 163/91, then 141/73, then 139/71 after patient relaxed. Patient to follow up with Dr. Adrian Blackwater for her routine check up.  Provided refills x 1 month but instructed patient to follow up with physician for more refills.   Results reviewed and written information provided.   Total time in face-to-face counseling 10 minutes.   F/U Clinic Visit with Dr. Adrian Blackwater.

## 2015-10-29 ENCOUNTER — Other Ambulatory Visit: Payer: Self-pay | Admitting: Family Medicine

## 2015-10-29 DIAGNOSIS — G5601 Carpal tunnel syndrome, right upper limb: Secondary | ICD-10-CM

## 2015-10-31 NOTE — Telephone Encounter (Signed)
Tramadol refilled Please inform patient  

## 2015-10-31 NOTE — Telephone Encounter (Signed)
LVM Rx at front office ready to be pickup 

## 2015-11-07 MED FILL — traMADol HCL 50 MG TABS: 50 | 20 days supply | Qty: 60 | Fill #0

## 2015-11-07 MED FILL — IBUPROFEN 800 MG TABLET: 800 | 10 days supply | Qty: 30 | Fill #0

## 2015-11-27 ENCOUNTER — Other Ambulatory Visit: Payer: Self-pay | Admitting: Family Medicine

## 2015-12-16 ENCOUNTER — Other Ambulatory Visit: Payer: Self-pay | Admitting: Family Medicine

## 2015-12-23 ENCOUNTER — Other Ambulatory Visit: Payer: Self-pay | Admitting: Family Medicine

## 2015-12-23 MED FILL — IBUPROFEN 800 MG TABLET: 800 | 30 days supply | Qty: 30 | Fill #0

## 2015-12-23 MED FILL — traMADol HCL 50 MG TABS: 50 | 20 days supply | Qty: 60 | Fill #1

## 2016-01-27 ENCOUNTER — Other Ambulatory Visit: Payer: Self-pay | Admitting: Family Medicine

## 2016-01-29 MED FILL — traMADol HCL 50 MG TABS: 50 | 20 days supply | Qty: 60 | Fill #2

## 2016-01-29 MED FILL — IBUPROFEN 800 MG TABLET: 800 | 10 days supply | Qty: 30 | Fill #2

## 2016-02-11 ENCOUNTER — Encounter: Payer: Self-pay | Admitting: Family Medicine

## 2016-02-11 ENCOUNTER — Ambulatory Visit: Payer: Commercial Managed Care - HMO | Attending: Family Medicine | Admitting: Family Medicine

## 2016-02-11 VITALS — BP 153/98 | HR 66 | Temp 98.6°F | Wt 233.8 lb

## 2016-02-11 DIAGNOSIS — I10 Essential (primary) hypertension: Secondary | ICD-10-CM | POA: Diagnosis not present

## 2016-02-11 DIAGNOSIS — Z7984 Long term (current) use of oral hypoglycemic drugs: Secondary | ICD-10-CM | POA: Insufficient documentation

## 2016-02-11 DIAGNOSIS — M25512 Pain in left shoulder: Secondary | ICD-10-CM | POA: Insufficient documentation

## 2016-02-11 DIAGNOSIS — E1121 Type 2 diabetes mellitus with diabetic nephropathy: Secondary | ICD-10-CM | POA: Diagnosis not present

## 2016-02-11 DIAGNOSIS — Z79899 Other long term (current) drug therapy: Secondary | ICD-10-CM | POA: Diagnosis not present

## 2016-02-11 DIAGNOSIS — Z87891 Personal history of nicotine dependence: Secondary | ICD-10-CM | POA: Diagnosis not present

## 2016-02-11 DIAGNOSIS — G5601 Carpal tunnel syndrome, right upper limb: Secondary | ICD-10-CM | POA: Insufficient documentation

## 2016-02-11 DIAGNOSIS — Z23 Encounter for immunization: Secondary | ICD-10-CM | POA: Diagnosis not present

## 2016-02-11 LAB — POCT GLYCOSYLATED HEMOGLOBIN (HGB A1C): Hemoglobin A1C: 6.3

## 2016-02-11 LAB — GLUCOSE, POCT (MANUAL RESULT ENTRY): POC GLUCOSE: 85 mg/dL (ref 70–99)

## 2016-02-11 MED ORDER — TRAMADOL HCL 50 MG PO TABS: 50.0000 mg | ORAL_TABLET | Freq: Three times a day (TID) | ORAL | 2 refills | Status: DC | PRN

## 2016-02-11 MED ORDER — GABAPENTIN 100 MG PO CAPS: 300.0000 mg | ORAL_CAPSULE | Freq: Every day | ORAL | 3 refills | Status: DC

## 2016-02-11 MED ORDER — IBUPROFEN 800 MG PO TABS: 800.0000 mg | ORAL_TABLET | Freq: Three times a day (TID) | ORAL | 3 refills | Status: DC | PRN

## 2016-02-11 MED ORDER — METFORMIN HCL ER 500 MG PO TB24: ORAL_TABLET | ORAL | 3 refills | Status: DC

## 2016-02-11 MED ORDER — CARVEDILOL 6.25 MG PO TABS: 6.2500 mg | ORAL_TABLET | Freq: Two times a day (BID) | ORAL | 3 refills | Status: DC

## 2016-02-11 NOTE — Assessment & Plan Note (Signed)
Controlled diabetes Continue metformin

## 2016-02-11 NOTE — Assessment & Plan Note (Signed)
Left lateral shoulder pain suspect arthritis or radiculopathy  Plan: Sleep on back with arms to side Heat Gabapentin, tramadol, ibuprofen If symptoms persist plan for sports medicine for ultrasound and possible injection

## 2016-02-11 NOTE — Patient Instructions (Addendum)
Desiree Sanchez was seen today for diabetes and hypertension.  Diagnoses and all orders for this visit:  Type 2 diabetes mellitus with diabetic nephropathy, without long-term current use of insulin (HCC) -     POCT glucose (manual entry) -     POCT glycosylated hemoglobin (Hb A1C) -     metFORMIN (GLUCOPHAGE-XR) 500 MG 24 hr tablet; TAKE 2 TABLETS EVERY DAY  AFTER  SUPPER  Essential hypertension -     carvedilol (COREG) 6.25 MG tablet; Take 1 tablet (6.25 mg total) by mouth 2 (two) times daily with a meal.  Right carpal tunnel syndrome -     ibuprofen (ADVIL,MOTRIN) 800 MG tablet; Take 1 tablet (800 mg total) by mouth every 8 (eight) hours as needed. Needs office visit for refills -     gabapentin (NEURONTIN) 100 MG capsule; Take 3 capsules (300 mg total) by mouth at bedtime. -     traMADol (ULTRAM) 50 MG tablet; Take 1 tablet (50 mg total) by mouth every 8 (eight) hours as needed.   Try gabapentin, lying on back with arms at side, heat for L shoulder/upper arms pains   Call if you develop worsening L shoulder pains  F/u in 6 months for diabetes  Dr. Armen Pickup     Dr. Armen Pickup    Influenza Virus Vaccine injection (Fluarix) What is this medicine? INFLUENZA VIRUS VACCINE (in floo EN zuh VAHY ruhs vak SEEN) helps to reduce the risk of getting influenza also known as the flu. This medicine may be used for other purposes; ask your health care provider or pharmacist if you have questions. What should I tell my health care provider before I take this medicine? They need to know if you have any of these conditions: -bleeding disorder like hemophilia -fever or infection -Guillain-Barre syndrome or other neurological problems -immune system problems -infection with the human immunodeficiency virus (HIV) or AIDS -low blood platelet counts -multiple sclerosis -an unusual or allergic reaction to influenza virus vaccine, eggs, chicken proteins, latex, gentamicin, other medicines, foods, dyes or  preservatives -pregnant or trying to get pregnant -breast-feeding How should I use this medicine? This vaccine is for injection into a muscle. It is given by a health care professional. A copy of Vaccine Information Statements will be given before each vaccination. Read this sheet carefully each time. The sheet may change frequently. Talk to your pediatrician regarding the use of this medicine in children. Special care may be needed. Overdosage: If you think you have taken too much of this medicine contact a poison control center or emergency room at once. NOTE: This medicine is only for you. Do not share this medicine with others. What if I miss a dose? This does not apply. What may interact with this medicine? -chemotherapy or radiation therapy -medicines that lower your immune system like etanercept, anakinra, infliximab, and adalimumab -medicines that treat or prevent blood clots like warfarin -phenytoin -steroid medicines like prednisone or cortisone -theophylline -vaccines This list may not describe all possible interactions. Give your health care provider a list of all the medicines, herbs, non-prescription drugs, or dietary supplements you use. Also tell them if you smoke, drink alcohol, or use illegal drugs. Some items may interact with your medicine. What should I watch for while using this medicine? Report any side effects that do not go away within 3 days to your doctor or health care professional. Call your health care provider if any unusual symptoms occur within 6 weeks of receiving this vaccine. You may still  catch the flu, but the illness is not usually as bad. You cannot get the flu from the vaccine. The vaccine will not protect against colds or other illnesses that may cause fever. The vaccine is needed every year. What side effects may I notice from receiving this medicine? Side effects that you should report to your doctor or health care professional as soon as  possible: -allergic reactions like skin rash, itching or hives, swelling of the face, lips, or tongue Side effects that usually do not require medical attention (report to your doctor or health care professional if they continue or are bothersome): -fever -headache -muscle aches and pains -pain, tenderness, redness, or swelling at site where injected -weak or tired This list may not describe all possible side effects. Call your doctor for medical advice about side effects. You may report side effects to FDA at 1-800-FDA-1088. Where should I keep my medicine? This vaccine is only given in a clinic, pharmacy, doctor's office, or other health care setting and will not be stored at home. NOTE: This sheet is a summary. It may not cover all possible information. If you have questions about this medicine, talk to your doctor, pharmacist, or health care provider.    2016, Elsevier/Gold Standard. (2007-12-21 09:30:40)

## 2016-02-11 NOTE — Progress Notes (Signed)
Subjective:  Patient ID: Desiree Sanchez, female    DOB: [birth year]  Age: 65 y.o. MRN: [id number]  CC: Diabetes and Hypertension   HPI Desiree Sanchez presents for   1. HTN: has ran out of coreg. Needs a refill. No HA, CP, SOB, leg swelling.   2. DM2: taking metformin. No tingling or numbness in feet. No vision changes.   3. L upper arm pain: at night. Achy and throbbing pain. Only occurs at night. No neck pain. Also with some tingling in L hand. Her pain is at night only. She does not have pain during the day. There is no associated CP or SOB.  Social History  Substance Use Topics  . Smoking status: Former Research scientist (life sciences)  . Smokeless tobacco: Never Used  . Alcohol use No    Outpatient Medications Prior to Visit  Medication Sig Dispense Refill  . ACCU-CHEK SOFTCLIX LANCETS lancets 1 each by Other route daily. ICD 10 E11.21 100 each 12  . allopurinol (ZYLOPRIM) 100 MG tablet Taper off allopurinol from 300 mg by mouth daily to 200 mg daily for 2 weeks, then 100 mg daily for two weeks then STOP if there is no flare 42 tablet 0  . Blood Glucose Monitoring Suppl (ACCU-CHEK AVIVA PLUS) w/Device KIT 1 Device by Does not apply route daily. ICD 10 E11.21 1 kit 0  . colchicine 0.6 MG tablet Take 1 tablet (0.6 mg total) by mouth 2 (two) times daily as needed (gout flare). 180 tablet 0  . gabapentin (NEURONTIN) 100 MG capsule Take 1-3 capsules (100-300 mg total) by mouth at bedtime. (Patient taking differently: Take 300 mg by mouth at bedtime. ) 90 capsule 0  . glucose blood (ACCU-CHEK AVIVA PLUS) test strip 1 each by Other route daily. ICD 10 E11.21 100 each 12  . ibuprofen (ADVIL,MOTRIN) 800 MG tablet Take 1 tablet (800 mg total) by mouth every 8 (eight) hours as needed. Needs office visit for refills 30 tablet 0  . metFORMIN (GLUCOPHAGE-XR) 500 MG 24 hr tablet TAKE 2 TABLETS EVERY DAY  AFTER  SUPPER 60 tablet 0  . traMADol (ULTRAM) 50 MG tablet TAKE 1 TABLET BY MOUTH EVERY 8 HOURS AS NEEDED FOR  SEVERE PAIN IN RIGHT HAND AND WRIST 60 tablet 2  . VITAMINS A C E-ZN PO Take by mouth.    Marland Kitchen atorvastatin (LIPITOR) 20 MG tablet Take 1 tablet (20 mg total) by mouth daily. (Patient not taking: Reported on 02/11/2016) 90 tablet 3  . carvedilol (COREG) 6.25 MG tablet Take 1 tablet (6.25 mg total) by mouth 2 (two) times daily with a meal. (Patient not taking: Reported on 02/11/2016) 60 tablet 0   No facility-administered medications prior to visit.     ROS Review of Systems  Constitutional: Negative for chills and fever.  Eyes: Negative for visual disturbance.  Respiratory: Negative for shortness of breath.   Cardiovascular: Negative for chest pain.  Gastrointestinal: Negative for abdominal pain and blood in stool.  Musculoskeletal: Positive for arthralgias (L shoulder pain at night ). Negative for back pain.  Skin: Negative for rash.  Allergic/Immunologic: Negative for immunocompromised state.  Hematological: Negative for adenopathy. Does not bruise/bleed easily.  Psychiatric/Behavioral: Negative for dysphoric mood and suicidal ideas.    Objective:  BP (!) 153/98 (BP Location: Left Arm, Patient Position: Sitting, Cuff Size: Large)   Pulse 66   Temp 98.6 F (37 C) (Oral)   Wt 233 lb 12.8 oz (106.1 kg)   SpO2 100%  BMI 33.07 kg/m   BP/Weight 02/11/2016 10/22/2015 4/88/8916  Systolic BP 945 038 882  Diastolic BP 98 73 800  Wt. (Lbs) 233.8 - 233  BMI 33.07 - 32.95   Physical Exam  Constitutional: She is oriented to person, place, and time. She appears well-developed and well-nourished. No distress.  HENT:  Head: Normocephalic and atraumatic.  Cardiovascular: Normal rate, regular rhythm, normal heart sounds and intact distal pulses.   Pulmonary/Chest: Effort normal and breath sounds normal.  Musculoskeletal: She exhibits no edema.       Left shoulder: She exhibits pain (upper lateral shoulder pain with arm elevation ).  Neurological: She is alert and oriented to person, place, and  time.  Skin: Skin is warm and dry. No rash noted.  Psychiatric: She has a normal mood and affect.   Lab Results  Component Value Date   HGBA1C 6.3 02/11/2016   CBG 85   Assessment & Plan:   Desiree Sanchez was seen today for diabetes and hypertension.  Diagnoses and all orders for this visit:  Type 2 diabetes mellitus with diabetic nephropathy, without long-term current use of insulin (HCC) -     POCT glucose (manual entry) -     POCT glycosylated hemoglobin (Hb A1C) -     metFORMIN (GLUCOPHAGE-XR) 500 MG 24 hr tablet; TAKE 2 TABLETS EVERY DAY  AFTER  SUPPER  Essential hypertension -     carvedilol (COREG) 6.25 MG tablet; Take 1 tablet (6.25 mg total) by mouth 2 (two) times daily with a meal.  Right carpal tunnel syndrome -     ibuprofen (ADVIL,MOTRIN) 800 MG tablet; Take 1 tablet (800 mg total) by mouth every 8 (eight) hours as needed. Needs office visit for refills -     gabapentin (NEURONTIN) 100 MG capsule; Take 3 capsules (300 mg total) by mouth at bedtime. -     traMADol (ULTRAM) 50 MG tablet; Take 1 tablet (50 mg total) by mouth every 8 (eight) hours as needed.  Flu vaccine need -     Flu Vaccine QUAD 36+ mos IM   No orders of the defined types were placed in this encounter.   Follow-up: No Follow-up on file.   Boykin Nearing MD

## 2016-02-11 NOTE — Progress Notes (Signed)
Pt needs refills on all medication. Pain in left shoulder at night.

## 2016-02-11 NOTE — Assessment & Plan Note (Signed)
A: elevated BP Med: missed a few doses of coreg P: Refilled coreg

## 2016-03-23 MED FILL — traMADol HCL 50 MG TABS: 50 | 20 days supply | Qty: 60 | Fill #0

## 2016-05-11 MED FILL — traMADol HCL 50 MG TABS: 50 | 20 days supply | Qty: 60 | Fill #1

## 2016-05-14 ENCOUNTER — Other Ambulatory Visit: Payer: Self-pay | Admitting: Family Medicine

## 2016-06-16 ENCOUNTER — Ambulatory Visit: Payer: Medicare HMO | Attending: Family Medicine | Admitting: Family Medicine

## 2016-06-16 ENCOUNTER — Encounter: Payer: Self-pay | Admitting: Family Medicine

## 2016-06-16 VITALS — BP 135/71 | HR 69 | Temp 98.4°F | Ht 70.0 in | Wt 239.4 lb

## 2016-06-16 DIAGNOSIS — E119 Type 2 diabetes mellitus without complications: Secondary | ICD-10-CM | POA: Diagnosis present

## 2016-06-16 DIAGNOSIS — J321 Chronic frontal sinusitis: Secondary | ICD-10-CM | POA: Diagnosis not present

## 2016-06-16 DIAGNOSIS — I10 Essential (primary) hypertension: Secondary | ICD-10-CM

## 2016-06-16 DIAGNOSIS — Z7984 Long term (current) use of oral hypoglycemic drugs: Secondary | ICD-10-CM | POA: Diagnosis not present

## 2016-06-16 DIAGNOSIS — R05 Cough: Secondary | ICD-10-CM | POA: Insufficient documentation

## 2016-06-16 DIAGNOSIS — Z87891 Personal history of nicotine dependence: Secondary | ICD-10-CM | POA: Diagnosis not present

## 2016-06-16 DIAGNOSIS — N760 Acute vaginitis: Secondary | ICD-10-CM | POA: Diagnosis not present

## 2016-06-16 DIAGNOSIS — E1121 Type 2 diabetes mellitus with diabetic nephropathy: Secondary | ICD-10-CM | POA: Diagnosis not present

## 2016-06-16 DIAGNOSIS — J0110 Acute frontal sinusitis, unspecified: Secondary | ICD-10-CM | POA: Insufficient documentation

## 2016-06-16 LAB — GLUCOSE, POCT (MANUAL RESULT ENTRY): POC GLUCOSE: 157 mg/dL — AB (ref 70–99)

## 2016-06-16 MED ORDER — TRIAMCINOLONE 0.1 % CREAM:EUCERIN CREAM 1:1: 1.0000 "application " | TOPICAL_CREAM | Freq: Two times a day (BID) | CUTANEOUS | 3 refills | Status: DC | PRN

## 2016-06-16 MED ORDER — AMOXICILLIN 875 MG PO TABS: 875.0000 mg | ORAL_TABLET | Freq: Two times a day (BID) | ORAL | 0 refills | Status: DC

## 2016-06-16 NOTE — Assessment & Plan Note (Signed)
Well controlled with current regimen Continue current regimen

## 2016-06-16 NOTE — Assessment & Plan Note (Signed)
Resolved with triamcinolone and Eucerin Medication refilled

## 2016-06-16 NOTE — Assessment & Plan Note (Signed)
Improving amox prn worsening

## 2016-06-16 NOTE — Assessment & Plan Note (Signed)
Well controlled. Continue metformin.

## 2016-06-16 NOTE — Progress Notes (Signed)
 Subjective:  Patient ID: Desiree Sanchez, female    DOB: 07/06/1950  Age: 65 y.o. MRN: [id number]  CC: Hypertension and Diabetes   HPI Desiree Sanchez presents for   1. HTN: compliant with medication regimen. No HA, CP, SOB, leg swelling.   2. DM2: taking metformin. No tingling or numbness in feet. No vision changes.   3. Vaginal itching: resolved with triamcinolone and Eucerin cream. Request Rx for cream.  4. Cold symptoms: for past week. Having cough that is productive of white sputum, have sinus burning and excessive mucus. Symptoms are improving. No fever or chills.   Social History  Substance Use Topics  . Smoking status: Former Smoker  . Smokeless tobacco: Never Used  . Alcohol use No    Outpatient Medications Prior to Visit  Medication Sig Dispense Refill  . ACCU-CHEK SOFTCLIX LANCETS lancets 1 each by Other route daily. ICD 10 E11.21 100 each 12  . allopurinol (ZYLOPRIM) 100 MG tablet Taper off allopurinol from 300 mg by mouth daily to 200 mg daily for 2 weeks, then 100 mg daily for two weeks then STOP if there is no flare 42 tablet 0  . atorvastatin (LIPITOR) 20 MG tablet Take 1 tablet (20 mg total) by mouth daily. 90 tablet 3  . Blood Glucose Monitoring Suppl (ACCU-CHEK AVIVA PLUS) w/Device KIT 1 Device by Does not apply route daily. ICD 10 E11.21 1 kit 0  . carvedilol (COREG) 6.25 MG tablet Take 1 tablet (6.25 mg total) by mouth 2 (two) times daily with a meal. 180 tablet 3  . colchicine 0.6 MG tablet Take 1 tablet (0.6 mg total) by mouth 2 (two) times daily as needed (gout flare). 180 tablet 0  . gabapentin (NEURONTIN) 100 MG capsule Take 3 capsules (300 mg total) by mouth at bedtime. 90 capsule 3  . glucose blood (ACCU-CHEK AVIVA PLUS) test strip 1 each by Other route daily. ICD 10 E11.21 100 each 12  . ibuprofen (ADVIL,MOTRIN) 800 MG tablet Take 1 tablet (800 mg total) by mouth every 8 (eight) hours as needed. Needs office visit for refills 90 tablet 3  . metFORMIN  (GLUCOPHAGE-XR) 500 MG 24 hr tablet TAKE 2 TABLETS EVERY DAY  AFTER  SUPPER 180 tablet 3  . traMADol (ULTRAM) 50 MG tablet Take 1 tablet (50 mg total) by mouth every 8 (eight) hours as needed. 60 tablet 2  . VITAMINS A C E-ZN PO Take by mouth.     No facility-administered medications prior to visit.     ROS Review of Systems  Constitutional: Negative for chills and fever.  HENT: Positive for congestion and sinus pressure.   Eyes: Negative for visual disturbance.  Respiratory: Positive for cough. Negative for shortness of breath.   Cardiovascular: Negative for chest pain.  Gastrointestinal: Negative for abdominal pain and blood in stool.  Musculoskeletal: Positive for arthralgias (L shoulder pain at night ). Negative for back pain.  Skin: Negative for rash.  Allergic/Immunologic: Negative for immunocompromised state.  Hematological: Negative for adenopathy. Does not bruise/bleed easily.  Psychiatric/Behavioral: Negative for dysphoric mood and suicidal ideas.    Objective:  BP 135/71 (BP Location: Right Arm, Patient Position: Sitting, Cuff Size: Large)   Pulse 69   Temp 98.4 F (36.9 C) (Oral)   Ht 5' 10" (1.778 m)   Wt 239 lb 6.4 oz (108.6 kg)   SpO2 98%   BMI 34.35 kg/m   BP/Weight 06/16/2016 02/11/2016 10/22/2015  Systolic BP 135 153 139  Diastolic   BP 71 98 73  Wt. (Lbs) 239.4 233.8 -  BMI 34.35 33.07 -   Physical Exam  Constitutional: She is oriented to person, place, and time. She appears well-developed and well-nourished. No distress.  HENT:  Head: Normocephalic and atraumatic.  Right Ear: Tympanic membrane and ear canal normal.  Left Ear: Tympanic membrane, external ear and ear canal normal.  Nose: Mucosal edema present. Right sinus exhibits no maxillary sinus tenderness and no frontal sinus tenderness. Left sinus exhibits no maxillary sinus tenderness and no frontal sinus tenderness.  Mouth/Throat: Oropharynx is clear and moist.  Cardiovascular: Normal rate, regular  rhythm, normal heart sounds and intact distal pulses.   Pulmonary/Chest: Effort normal and breath sounds normal.  Musculoskeletal: She exhibits no edema.       Left shoulder: She exhibits pain (upper lateral shoulder pain with arm elevation ).  Neurological: She is alert and oriented to person, place, and time.  Skin: Skin is warm and dry. No rash noted.  Psychiatric: She has a normal mood and affect.   Lab Results  Component Value Date   HGBA1C 6.3 02/11/2016   CBG 157   Assessment & Plan:   Aiden was seen today for hypertension and diabetes.  Diagnoses and all orders for this visit:  Type 2 diabetes mellitus with diabetic nephropathy, without long-term current use of insulin (HCC) -     POCT glucose (manual entry)  Vaginitis and vulvovaginitis -     Triamcinolone Acetonide (TRIAMCINOLONE 0.1 % CREAM : EUCERIN) CREA; Apply 1 application topically 2 (two) times daily as needed.  Acute non-recurrent frontal sinusitis -     amoxicillin (AMOXIL) 875 MG tablet; Take 1 tablet (875 mg total) by mouth 2 (two) times daily.   No orders of the defined types were placed in this encounter.   Follow-up: Return in about 3 months (around 09/14/2016) for DM and HTN .   Josalyn Funches MD   

## 2016-06-16 NOTE — Patient Instructions (Addendum)
Tameria was seen today for hypertension and diabetes.  Diagnoses and all orders for this visit:  Type 2 diabetes mellitus with diabetic nephropathy, without long-term current use of insulin (HCC) -     POCT glucose (manual entry)  Vaginitis and vulvovaginitis -     Triamcinolone Acetonide (TRIAMCINOLONE 0.1 % CREAM : EUCERIN) CREA; Apply 1 application topically 2 (two) times daily as needed.  Acute non-recurrent frontal sinusitis -     amoxicillin (AMOXIL) 875 MG tablet; Take 1 tablet (875 mg total) by mouth 2 (two) times daily.  take amoxicillin if symptoms fail to continue to improve  You may continue care of the elderly once your cough resolves   F/u in  3 months for diabetes and HTN   Dr. Adrian Blackwater

## 2016-06-22 ENCOUNTER — Telehealth: Payer: Self-pay | Admitting: Family Medicine

## 2016-06-22 DIAGNOSIS — N760 Acute vaginitis: Secondary | ICD-10-CM

## 2016-06-22 MED ORDER — TRIAMCINOLONE 0.1 % CREAM:EUCERIN CREAM 1:1: 1.0000 "application " | TOPICAL_CREAM | Freq: Two times a day (BID) | CUTANEOUS | 3 refills | Status: DC | PRN

## 2016-06-22 NOTE — Telephone Encounter (Signed)
Called patient verified name and DOB Received fax from her [facility] that they do not compound  Sent triamcinolone-eucerin Rx to her local [facility]

## 2016-06-26 ENCOUNTER — Telehealth: Payer: Self-pay | Admitting: Family Medicine

## 2016-06-26 DIAGNOSIS — N760 Acute vaginitis: Secondary | ICD-10-CM

## 2016-06-26 MED ORDER — TRIAMCINOLONE 0.1 % CREAM:EUCERIN CREAM 1:1: 1.0000 "application " | TOPICAL_CREAM | Freq: Two times a day (BID) | CUTANEOUS | 3 refills | Status: DC | PRN

## 2016-06-26 NOTE — Telephone Encounter (Signed)
Patient needs cream for vaginal dryness resent to pharmacy. Pharmacy said they didn't receive it. Please follow up.   Patient is also needing Indomethacin. Patient would like to use [facility] as well too

## 2016-06-26 NOTE — Telephone Encounter (Signed)
The cream prescription was printed and not e-scribed. I have e-scribed it to [facility]. Patient does not have active prescription for indomethacin. Will defer that to Dr. Adrian Blackwater.

## 2016-06-26 NOTE — Telephone Encounter (Signed)
Indomethacin does not appear on her med list. This will be addressed when Dr Adrian Blackwater is in the office. Use Allopurinol and Colchicine for Gout.

## 2016-07-02 ENCOUNTER — Telehealth: Payer: Self-pay | Admitting: Family Medicine

## 2016-07-02 ENCOUNTER — Other Ambulatory Visit: Payer: Self-pay | Admitting: Family Medicine

## 2016-07-02 DIAGNOSIS — M1A9XX0 Chronic gout, unspecified, without tophus (tophi): Secondary | ICD-10-CM

## 2016-07-02 DIAGNOSIS — G5601 Carpal tunnel syndrome, right upper limb: Secondary | ICD-10-CM

## 2016-07-02 DIAGNOSIS — N760 Acute vaginitis: Secondary | ICD-10-CM

## 2016-07-02 MED ORDER — INDOMETHACIN 25 MG PO CAPS: 25.0000 mg | ORAL_CAPSULE | Freq: Two times a day (BID) | ORAL | 0 refills | Status: DC

## 2016-07-02 MED ORDER — TRIAMCINOLONE 0.1 % CREAM:EUCERIN CREAM 1:1: 1.0000 "application " | TOPICAL_CREAM | Freq: Two times a day (BID) | CUTANEOUS | 3 refills | Status: AC | PRN

## 2016-07-02 NOTE — Telephone Encounter (Signed)
Patient called the office to speak with PCP regarding her medication. Hasn't received any calls since she placed request. Please follow up.  Thank you.

## 2016-07-17 MED FILL — traMADol HCL 50 MG TABS: 50 | 20 days supply | Qty: 60 | Fill #2

## 2016-08-03 ENCOUNTER — Ambulatory Visit: Payer: Medicare HMO | Attending: Family Medicine | Admitting: Family Medicine

## 2016-08-03 ENCOUNTER — Encounter: Payer: Self-pay | Admitting: Family Medicine

## 2016-08-03 VITALS — BP 150/82 | HR 67 | Temp 98.5°F | Ht 70.0 in | Wt 238.2 lb

## 2016-08-03 DIAGNOSIS — Z87891 Personal history of nicotine dependence: Secondary | ICD-10-CM | POA: Insufficient documentation

## 2016-08-03 DIAGNOSIS — I10 Essential (primary) hypertension: Secondary | ICD-10-CM | POA: Diagnosis not present

## 2016-08-03 DIAGNOSIS — Z7984 Long term (current) use of oral hypoglycemic drugs: Secondary | ICD-10-CM | POA: Diagnosis not present

## 2016-08-03 DIAGNOSIS — K921 Melena: Secondary | ICD-10-CM | POA: Insufficient documentation

## 2016-08-03 DIAGNOSIS — M109 Gout, unspecified: Secondary | ICD-10-CM | POA: Diagnosis not present

## 2016-08-03 DIAGNOSIS — E1121 Type 2 diabetes mellitus with diabetic nephropathy: Secondary | ICD-10-CM | POA: Insufficient documentation

## 2016-08-03 LAB — HEMOCCULT GUIAC POC 1CARD (OFFICE): FECAL OCCULT BLD: NEGATIVE

## 2016-08-03 LAB — CBC
HCT: 31.9 % — ABNORMAL LOW (ref 35.0–45.0)
HEMOGLOBIN: 10.4 g/dL — AB (ref 11.7–15.5)
MCH: 29.1 pg (ref 27.0–33.0)
MCHC: 32.6 g/dL (ref 32.0–36.0)
MCV: 89.1 fL (ref 80.0–100.0)
MPV: 9.4 fL (ref 7.5–12.5)
Platelets: 294 10*3/uL (ref 140–400)
RBC: 3.58 MIL/uL — AB (ref 3.80–5.10)
RDW: 14.9 % (ref 11.0–15.0)
WBC: 6.4 10*3/uL (ref 3.8–10.8)

## 2016-08-03 LAB — POCT GLYCOSYLATED HEMOGLOBIN (HGB A1C): HEMOGLOBIN A1C: 6.3

## 2016-08-03 LAB — BASIC METABOLIC PANEL WITH GFR
BUN: 12 mg/dL (ref 7–25)
CALCIUM: 9.2 mg/dL (ref 8.6–10.4)
CHLORIDE: 109 mmol/L (ref 98–110)
CO2: 23 mmol/L (ref 20–31)
Creat: 1.19 mg/dL — ABNORMAL HIGH (ref 0.50–0.99)
GFR, EST NON AFRICAN AMERICAN: 47 mL/min — AB (ref >=60)
GFR, Est African American: 55 mL/min — ABNORMAL LOW (ref >=60)
GLUCOSE: 114 mg/dL — AB (ref 65–99)
Potassium: 3.8 mmol/L (ref 3.5–5.3)
Sodium: 141 mmol/L (ref 135–146)

## 2016-08-03 LAB — GLUCOSE, POCT (MANUAL RESULT ENTRY): POC Glucose: 146 mg/dl — AB (ref 70–99)

## 2016-08-03 NOTE — Patient Instructions (Addendum)
Diagnoses and all orders for this visit:  Type 2 diabetes mellitus with diabetic nephropathy, without long-term current use of insulin (HCC) -     POCT glucose (manual entry) -     POCT glycosylated hemoglobin (Hb A1C)  Blood in stool -     CBC -     Hemoccult - 1 Card (office)  Essential hypertension -     BASIC METABOLIC PANEL WITH GFR  BP is slightly elevated today Reduce salt in diet If BP elevated at f/u it would be a good idea to increase coreg to 12.5 mg twice daily  Stool card is negative today Stop indocin and ibuprofen for next month Instead take colchicine, tramadol, tumeric, tart cherry juice Call if bleeding returns  F/u in 6 weeks for lower GI bleed and HTN Dr. Adrian Blackwater

## 2016-08-03 NOTE — Progress Notes (Signed)
Pt is here today for blood in her stool . Pt states that she has seen blood twice this month.

## 2016-08-03 NOTE — Assessment & Plan Note (Signed)
Episode of blood in stool x 2 Heme negative stool in office today Normal abdominal exam Suspect NSAID related lower GI bleed  Plan: CBC 1 month off NSAID If bleeding reoccurs with do course of cipro/flagyl

## 2016-08-03 NOTE — Assessment & Plan Note (Signed)
BP is slightly elevated today Reduce salt in diet If BP elevated at f/u it would be a good idea to increase coreg to 12.5 mg twice daily

## 2016-08-03 NOTE — Progress Notes (Signed)
Subjective:  Patient ID: Desiree Sanchez, female    DOB: 07-Dec-1950  Age: 66 y.o. MRN: [id number]  CC: No chief complaint on file.   HPI Desiree Sanchez has diabetes, HTN, gout she presents for   1. Blood in stool: she has had 2 episode of bleeding in last month. She describes bright red blood on tissue. Stool looked bloody. Painless bleeding. The bleeding lasted for 2 days. She had some associated diarrhea both times. She denies fever, chills, weight loss and stomach pain.   She takes NSAID: ibuprofen 800 mg or indocin prn gout related joint pain.   She had a negative screening c-scope in 11/2013, no polyps. Report scanned in chart. Recommendation was f/u in 10 years.   2. L middle finger: 2 months ago. She got a paper cut on the tip of her finger. She developed pain and swelling that lasted for 2-3 weeks. Finger was dark and skin peeled.   Social History  Substance Use Topics  . Smoking status: Former Research scientist (life sciences)  . Smokeless tobacco: Never Used  . Alcohol use No    Outpatient Medications Prior to Visit  Medication Sig Dispense Refill  . ACCU-CHEK SOFTCLIX LANCETS lancets 1 each by Other route daily. ICD 10 E11.21 100 each 12  . allopurinol (ZYLOPRIM) 100 MG tablet Taper off allopurinol from 300 mg by mouth daily to 200 mg daily for 2 weeks, then 100 mg daily for two weeks then STOP if there is no flare 42 tablet 0  . atorvastatin (LIPITOR) 20 MG tablet Take 1 tablet (20 mg total) by mouth daily. 90 tablet 3  . Blood Glucose Monitoring Suppl (ACCU-CHEK AVIVA PLUS) w/Device KIT 1 Device by Does not apply route daily. ICD 10 E11.21 1 kit 0  . carvedilol (COREG) 6.25 MG tablet Take 1 tablet (6.25 mg total) by mouth 2 (two) times daily with a meal. 180 tablet 3  . colchicine 0.6 MG tablet Take 1 tablet (0.6 mg total) by mouth 2 (two) times daily as needed (gout flare). 180 tablet 0  . gabapentin (NEURONTIN) 100 MG capsule Take 3 capsules (300 mg total) by mouth at bedtime. 90 capsule 3  .  glucose blood (ACCU-CHEK AVIVA PLUS) test strip 1 each by Other route daily. ICD 10 E11.21 100 each 12  . ibuprofen (ADVIL,MOTRIN) 800 MG tablet Take 1 tablet (800 mg total) by mouth every 8 (eight) hours as needed. Needs office visit for refills 90 tablet 3  . indomethacin (INDOCIN) 25 MG capsule Take 1 capsule (25 mg total) by mouth 2 (two) times daily with a meal. 60 capsule 0  . metFORMIN (GLUCOPHAGE-XR) 500 MG 24 hr tablet TAKE 2 TABLETS EVERY DAY  AFTER  SUPPER 180 tablet 3  . traMADol (ULTRAM) 50 MG tablet Take 1 tablet (50 mg total) by mouth every 8 (eight) hours as needed. 60 tablet 2  . VITAMINS A C E-ZN PO Take by mouth.    Marland Kitchen amoxicillin (AMOXIL) 875 MG tablet Take 1 tablet (875 mg total) by mouth 2 (two) times daily. (Patient not taking: Reported on 08/03/2016) 20 tablet 0  . Triamcinolone Acetonide (TRIAMCINOLONE 0.1 % CREAM : EUCERIN) CREA Apply 1 application topically 2 (two) times daily as needed. Please mix 1:1 (Patient not taking: Reported on 08/03/2016) 1 each 3   No facility-administered medications prior to visit.     ROS Review of Systems  Constitutional: Negative for chills and fever.  Eyes: Negative for visual disturbance.  Respiratory: Negative for  shortness of breath.   Cardiovascular: Negative for chest pain.  Gastrointestinal: Positive for blood in stool and diarrhea. Negative for abdominal distention, abdominal pain, anal bleeding, constipation, nausea, rectal pain and vomiting.  Musculoskeletal: Negative for arthralgias and back pain.  Skin: Negative for rash.  Allergic/Immunologic: Negative for immunocompromised state.  Hematological: Negative for adenopathy. Does not bruise/bleed easily.  Psychiatric/Behavioral: Negative for dysphoric mood and suicidal ideas.    Objective:  BP (!) 150/82 (BP Location: Left Arm, Patient Position: Sitting, Cuff Size: Large)   Pulse 67   Temp 98.5 F (36.9 C) (Oral)   Ht 5' 10" (1.778 m)   Wt 238 lb 3.2 oz (108 kg)   SpO2  99%   BMI 34.18 kg/m   BP/Weight 08/03/2016 0/12/6224 08/09/3543  Systolic BP 625 638 937  Diastolic BP 82 71 98  Wt. (Lbs) 238.2 239.4 233.8  BMI 34.18 34.35 33.07    Physical Exam  Constitutional: She is oriented to person, place, and time. She appears well-developed and well-nourished. No distress.  HENT:  Head: Normocephalic and atraumatic.  Cardiovascular: Normal rate, regular rhythm, normal heart sounds and intact distal pulses.   Pulmonary/Chest: Effort normal and breath sounds normal.  Abdominal: Soft. Bowel sounds are normal. She exhibits no distension and no mass. There is no tenderness. There is no rebound and no guarding.  Genitourinary: Rectal exam shows no external hemorrhoid, no internal hemorrhoid, no fissure, no mass, no tenderness, anal tone normal and guaiac negative stool.  Musculoskeletal: She exhibits no edema.       Hands: Neurological: She is alert and oriented to person, place, and time.  Skin: Skin is warm and dry. No rash noted.  Psychiatric: She has a normal mood and affect.    Lab Results  Component Value Date   HGBA1C 6.3 08/03/2016   CBG 146 Assessment & Plan:   Diagnoses and all orders for this visit:  Type 2 diabetes mellitus with diabetic nephropathy, without long-term current use of insulin (HCC) -     POCT glucose (manual entry) -     POCT glycosylated hemoglobin (Hb A1C)  Blood in stool -     CBC -     Hemoccult - 1 Card (office)  Essential hypertension -     BASIC METABOLIC PANEL WITH GFR    No orders of the defined types were placed in this encounter.   Follow-up: Return in about 6 weeks (around 09/14/2016) for lower GI bleed .   Desiree Nearing MD

## 2016-08-05 ENCOUNTER — Telehealth: Payer: Self-pay

## 2016-08-05 NOTE — Telephone Encounter (Signed)
Pt was called and informed of lab results. 

## 2016-08-06 DIAGNOSIS — I200 Unstable angina: Secondary | ICD-10-CM

## 2016-08-06 HISTORY — DX: Unstable angina: I20.0

## 2016-09-04 ENCOUNTER — Emergency Department (HOSPITAL_COMMUNITY): Payer: Medicare HMO

## 2016-09-04 ENCOUNTER — Encounter (HOSPITAL_COMMUNITY): Payer: Self-pay | Admitting: Emergency Medicine

## 2016-09-04 ENCOUNTER — Observation Stay (HOSPITAL_COMMUNITY)
Admission: EM | Admit: 2016-09-04 | Discharge: 2016-09-08 | Disposition: A | Discharge status: Home / Self Care | Payer: Medicare HMO | Attending: Internal Medicine | Admitting: Internal Medicine

## 2016-09-04 DIAGNOSIS — M109 Gout, unspecified: Secondary | ICD-10-CM | POA: Diagnosis not present

## 2016-09-04 DIAGNOSIS — Z833 Family history of diabetes mellitus: Secondary | ICD-10-CM | POA: Insufficient documentation

## 2016-09-04 DIAGNOSIS — M1990 Unspecified osteoarthritis, unspecified site: Secondary | ICD-10-CM | POA: Diagnosis not present

## 2016-09-04 DIAGNOSIS — I10 Essential (primary) hypertension: Secondary | ICD-10-CM | POA: Diagnosis not present

## 2016-09-04 DIAGNOSIS — E669 Obesity, unspecified: Secondary | ICD-10-CM | POA: Insufficient documentation

## 2016-09-04 DIAGNOSIS — E876 Hypokalemia: Secondary | ICD-10-CM | POA: Diagnosis not present

## 2016-09-04 DIAGNOSIS — R011 Cardiac murmur, unspecified: Secondary | ICD-10-CM | POA: Insufficient documentation

## 2016-09-04 DIAGNOSIS — Z87891 Personal history of nicotine dependence: Secondary | ICD-10-CM | POA: Diagnosis not present

## 2016-09-04 DIAGNOSIS — R079 Chest pain, unspecified: Secondary | ICD-10-CM | POA: Diagnosis not present

## 2016-09-04 DIAGNOSIS — Z8249 Family history of ischemic heart disease and other diseases of the circulatory system: Secondary | ICD-10-CM | POA: Diagnosis not present

## 2016-09-04 DIAGNOSIS — D649 Anemia, unspecified: Secondary | ICD-10-CM | POA: Diagnosis not present

## 2016-09-04 DIAGNOSIS — I119 Hypertensive heart disease without heart failure: Secondary | ICD-10-CM | POA: Insufficient documentation

## 2016-09-04 DIAGNOSIS — M1A9XX0 Chronic gout, unspecified, without tophus (tophi): Secondary | ICD-10-CM | POA: Diagnosis not present

## 2016-09-04 DIAGNOSIS — Z7984 Long term (current) use of oral hypoglycemic drugs: Secondary | ICD-10-CM | POA: Insufficient documentation

## 2016-09-04 DIAGNOSIS — E1121 Type 2 diabetes mellitus with diabetic nephropathy: Secondary | ICD-10-CM

## 2016-09-04 DIAGNOSIS — I200 Unstable angina: Secondary | ICD-10-CM | POA: Diagnosis not present

## 2016-09-04 DIAGNOSIS — I25110 Atherosclerotic heart disease of native coronary artery with unstable angina pectoris: Principal | ICD-10-CM | POA: Insufficient documentation

## 2016-09-04 DIAGNOSIS — R7989 Other specified abnormal findings of blood chemistry: Secondary | ICD-10-CM | POA: Insufficient documentation

## 2016-09-04 DIAGNOSIS — Z6836 Body mass index (BMI) 36.0-36.9, adult: Secondary | ICD-10-CM | POA: Insufficient documentation

## 2016-09-04 DIAGNOSIS — N179 Acute kidney failure, unspecified: Secondary | ICD-10-CM | POA: Diagnosis not present

## 2016-09-04 DIAGNOSIS — R071 Chest pain on breathing: Secondary | ICD-10-CM

## 2016-09-04 DIAGNOSIS — E119 Type 2 diabetes mellitus without complications: Secondary | ICD-10-CM

## 2016-09-04 DIAGNOSIS — I252 Old myocardial infarction: Secondary | ICD-10-CM | POA: Insufficient documentation

## 2016-09-04 HISTORY — DX: Dyspnea, unspecified: R06.00

## 2016-09-04 HISTORY — DX: Unstable angina: I20.0

## 2016-09-04 HISTORY — DX: Cardiac murmur, unspecified: R01.1

## 2016-09-04 HISTORY — DX: Gout, unspecified: M10.9

## 2016-09-04 LAB — BASIC METABOLIC PANEL
Anion gap: 12 (ref 5–15)
BUN: 16 mg/dL (ref 6–20)
CO2: 26 mmol/L (ref 22–32)
Calcium: 9.2 mg/dL (ref 8.9–10.3)
Chloride: 106 mmol/L (ref 101–111)
Creatinine, Ser: 1.13 mg/dL — ABNORMAL HIGH (ref 0.44–1.00)
GFR calc Af Amer: 57 mL/min — ABNORMAL LOW (ref >60)
GFR calc non Af Amer: 49 mL/min — ABNORMAL LOW (ref >60)
Glucose, Bld: 146 mg/dL — ABNORMAL HIGH (ref 65–99)
Potassium: 3.3 mmol/L — ABNORMAL LOW (ref 3.5–5.1)
Sodium: 144 mmol/L (ref 135–145)

## 2016-09-04 LAB — CBC
HEMATOCRIT: 30.3 % — AB (ref 36.0–46.0)
HEMATOCRIT: 30.9 % — AB (ref 36.0–46.0)
HEMOGLOBIN: 9.9 g/dL — AB (ref 12.0–15.0)
Hemoglobin: 9.6 g/dL — ABNORMAL LOW (ref 12.0–15.0)
MCH: 28.5 pg (ref 26.0–34.0)
MCH: 28.9 pg (ref 26.0–34.0)
MCHC: 31.7 g/dL (ref 30.0–36.0)
MCHC: 32 g/dL (ref 30.0–36.0)
MCV: 89.9 fL (ref 78.0–100.0)
MCV: 90.1 fL (ref 78.0–100.0)
PLATELETS: 233 10*3/uL (ref 150–400)
Platelets: 241 10*3/uL (ref 150–400)
RBC: 3.37 MIL/uL — ABNORMAL LOW (ref 3.87–5.11)
RBC: 3.43 MIL/uL — AB (ref 3.87–5.11)
RDW: 14.8 % (ref 11.5–15.5)
RDW: 14.6 % (ref 11.5–15.5)
WBC: 6.2 10*3/uL (ref 4.0–10.5)
WBC: 7 10*3/uL (ref 4.0–10.5)

## 2016-09-04 LAB — GLUCOSE, CAPILLARY
GLUCOSE-CAPILLARY: 155 mg/dL — AB (ref 65–99)
GLUCOSE-CAPILLARY: 105 mg/dL — AB (ref 65–99)
GLUCOSE-CAPILLARY: 172 mg/dL — AB (ref 65–99)

## 2016-09-04 LAB — TROPONIN I
TROPONIN I: 0.2 ng/mL — AB (ref <0.03)
TROPONIN I: 0.17 ng/mL — AB (ref <0.03)
Troponin I: 0.2 ng/mL (ref <0.03)

## 2016-09-04 LAB — MAGNESIUM: MAGNESIUM: 2 mg/dL (ref 1.7–2.4)

## 2016-09-04 LAB — CREATININE, SERUM
CREATININE: 1.08 mg/dL — AB (ref 0.44–1.00)
GFR calc Af Amer: 60 mL/min — ABNORMAL LOW (ref >60)
GFR calc non Af Amer: 52 mL/min — ABNORMAL LOW (ref >60)

## 2016-09-04 LAB — MRSA PCR SCREENING: MRSA BY PCR: NEGATIVE

## 2016-09-04 LAB — I-STAT TROPONIN, ED: TROPONIN I, POC: 0 ng/mL (ref 0.00–0.08)

## 2016-09-04 MED ORDER — NITROGLYCERIN 0.4 MG SL SUBL
0.4000 mg | SUBLINGUAL_TABLET | SUBLINGUAL | Status: DC | PRN
Start: 2016-09-04 — End: 2016-09-08
  Administered 2016-09-04 (×2): 0.4 mg via SUBLINGUAL
  Filled 2016-09-04: qty 1

## 2016-09-04 MED ORDER — ZOLPIDEM TARTRATE 5 MG PO TABS
5.0000 mg | ORAL_TABLET | Freq: Every evening | ORAL | Status: DC | PRN
  Administered 2016-09-04 – 2016-09-07 (×4): 5 mg via ORAL
  Filled 2016-09-04 (×4): qty 1

## 2016-09-04 MED ORDER — CARVEDILOL 6.25 MG PO TABS
6.2500 mg | ORAL_TABLET | Freq: Two times a day (BID) | ORAL | Status: DC
  Administered 2016-09-04: 6.25 mg via ORAL
  Filled 2016-09-04: qty 1

## 2016-09-04 MED ORDER — COLCHICINE 0.6 MG PO TABS: 0.6000 mg | ORAL_TABLET | Freq: Two times a day (BID) | ORAL | Status: DC | PRN

## 2016-09-04 MED ORDER — POTASSIUM CHLORIDE CRYS ER 20 MEQ PO TBCR
40.0000 meq | EXTENDED_RELEASE_TABLET | Freq: Once | ORAL | Status: AC
  Administered 2016-09-04: 40 meq via ORAL
  Filled 2016-09-04: qty 2

## 2016-09-04 MED ORDER — MORPHINE SULFATE (PF) 4 MG/ML IV SOLN: 2.0000 mg | INTRAVENOUS | Status: DC | PRN

## 2016-09-04 MED ORDER — ONDANSETRON HCL 4 MG/2ML IJ SOLN: 4.0000 mg | Freq: Four times a day (QID) | INTRAMUSCULAR | Status: DC | PRN

## 2016-09-04 MED ORDER — ASPIRIN EC 325 MG PO TBEC
325.0000 mg | DELAYED_RELEASE_TABLET | Freq: Every day | ORAL | Status: DC
  Administered 2016-09-05 – 2016-09-07 (×3): 325 mg via ORAL
  Filled 2016-09-04 (×4): qty 1

## 2016-09-04 MED ORDER — INSULIN ASPART 100 UNIT/ML ~~LOC~~ SOLN
0.0000 [IU] | Freq: Three times a day (TID) | SUBCUTANEOUS | Status: DC
  Administered 2016-09-04: 3 [IU] via SUBCUTANEOUS
  Administered 2016-09-05 – 2016-09-07 (×3): 2 [IU] via SUBCUTANEOUS

## 2016-09-04 MED ORDER — HEPARIN SODIUM (PORCINE) 5000 UNIT/ML IJ SOLN
5000.0000 [IU] | Freq: Three times a day (TID) | INTRAMUSCULAR | Status: DC
  Administered 2016-09-04 – 2016-09-08 (×10): 5000 [IU] via SUBCUTANEOUS
  Filled 2016-09-04 (×10): qty 1

## 2016-09-04 MED ORDER — NITROGLYCERIN 0.4 MG SL SUBL
SUBLINGUAL_TABLET | SUBLINGUAL | Status: AC
  Filled 2016-09-04: qty 1

## 2016-09-04 MED ORDER — ACETAMINOPHEN 325 MG PO TABS
650.0000 mg | ORAL_TABLET | ORAL | Status: DC | PRN
  Administered 2016-09-06 – 2016-09-07 (×2): 650 mg via ORAL
  Filled 2016-09-04 (×2): qty 2

## 2016-09-04 MED ORDER — HYDRALAZINE HCL 20 MG/ML IJ SOLN
10.0000 mg | Freq: Three times a day (TID) | INTRAMUSCULAR | Status: DC | PRN
  Administered 2016-09-07: 21:00:00 10 mg via INTRAVENOUS
  Filled 2016-09-04: qty 1

## 2016-09-04 MED ORDER — CARVEDILOL 12.5 MG PO TABS
12.5000 mg | ORAL_TABLET | Freq: Two times a day (BID) | ORAL | Status: DC
  Administered 2016-09-04 – 2016-09-07 (×6): 12.5 mg via ORAL
  Filled 2016-09-04 (×6): qty 1

## 2016-09-04 NOTE — ED Triage Notes (Signed)
Per [facility], Pt complains of left chest pain x 3 days, pain radiates to her back. Pt also complains of headache x 3 days. EMS reports pt diaphoretic on scene. BP 240/104 intially. Pt received 324 of aspirin and 2 nitro en route. Pt reports the nitro decreased her pain from 8/10 to 1/10.

## 2016-09-04 NOTE — ED Provider Notes (Signed)
[facility] DEPT Provider Note   CSN: [id number] Arrival date & time: 09/04/16  0208   By signing my name below, I, Delton Prairie, attest that this documentation has been prepared under the direction and in the presence of Ripley Fraise, MD  Electronically Signed: Delton Prairie, ED Scribe. 09/04/16. 4:00 AM.   History   Chief Complaint Chief Complaint  Patient presents with  . Chest Pain    HPI Comments:  Desiree Sanchez is a 66 y.o. female, with a PMHx of DM on metformin, HTN, gout and arthritis, who presents to the Emergency Department, via EMS, complaining of acute onset, gradually worsening, intermittent left sided chest pain x 3 days which worsened today. Pt states earlier this week she was experiencing chest pain and SOB during ambulation which would resolve with rest. She notes her chest pain, today, began while she was trying to go to sleep. She also reports headaches, SOB and diaphoresis. Her chest pain and SOB is worse with exertion. She was given 324 ASA and nitroglycerin by EMS en route to the ED with relief. Pt denies fevers, vomiting, abdominal pain, hemoptysis, leg swelling or any other associated symptoms. No other complaints noted.   The history is provided by the patient. No language interpreter was used.  Chest Pain   This is a new problem. The current episode started more than 2 days ago. The problem has been gradually worsening. The pain is associated with walking and rest. The pain is moderate. The pain does not radiate. Associated symptoms include diaphoresis, headaches and shortness of breath. Pertinent negatives include no abdominal pain, no fever, no hemoptysis and no vomiting. She has tried nitroglycerin for the symptoms. The treatment provided moderate relief. Risk factors include being elderly.  Her past medical history is significant for diabetes and hypertension.  Her family medical history is significant for diabetes.    Past Medical History:  Diagnosis  Date  . Arthritis Dx 2006  . Carpal tunnel syndrome   . Diabetes mellitus without complication ([place]) Dx 4098  . Gout   . Hypertension Dx 2005    Patient Active Problem List   Diagnosis Date Noted  . Blood in stool 08/03/2016  . Vaginitis and vulvovaginitis 06/16/2016  . Shoulder pain, left 02/11/2016  . Right carpal tunnel syndrome 07/29/2014  . Vitamin D insufficiency 04/13/2014  . Obesity (BMI 30-39.9) 03/13/2013  . Gout 05/20/2009  . HEART MURMUR, SYSTOLIC 11/91/4782  . DM2 (diabetes mellitus, type 2) ([place]) 04/22/2009  . Essential hypertension 04/22/2009    Past Surgical History:  Procedure Laterality Date  . APPENDECTOMY    . TUBAL LIGATION      OB History    Gravida Para Term Preterm AB Living   3 3 3  0 0 3   SAB TAB Ectopic Multiple Live Births   0 0 0 0         Home Medications    Prior to Admission medications   Medication Sig Start Date End Date Taking? Authorizing Provider  ACCU-CHEK SOFTCLIX LANCETS lancets 1 each by Other route daily. ICD 10 E11.21 07/09/15   Boykin Nearing, MD  allopurinol (ZYLOPRIM) 100 MG tablet Taper off allopurinol from 300 mg by mouth daily to 200 mg daily for 2 weeks, then 100 mg daily for two weeks then STOP if there is no flare 07/10/15   Boykin Nearing, MD  atorvastatin (LIPITOR) 20 MG tablet Take 1 tablet (20 mg total) by mouth daily. 07/26/14   Boykin Nearing, MD  Blood Glucose Monitoring Suppl (ACCU-CHEK AVIVA PLUS) w/Device KIT 1 Device by Does not apply route daily. ICD 10 E11.21 07/09/15   Boykin Nearing, MD  carvedilol (COREG) 6.25 MG tablet Take 1 tablet (6.25 mg total) by mouth 2 (two) times daily with a meal. 02/11/16   Boykin Nearing, MD  colchicine 0.6 MG tablet Take 1 tablet (0.6 mg total) by mouth 2 (two) times daily as needed (gout flare). 07/10/15   Josalyn Funches, MD  gabapentin (NEURONTIN) 100 MG capsule Take 3 capsules (300 mg total) by mouth at bedtime. 02/11/16   Josalyn Funches, MD  glucose blood (ACCU-CHEK AVIVA  PLUS) test strip 1 each by Other route daily. ICD 10 E11.21 07/09/15   Boykin Nearing, MD  ibuprofen (ADVIL,MOTRIN) 800 MG tablet Take 1 tablet (800 mg total) by mouth every 8 (eight) hours as needed. Needs office visit for refills 02/11/16   Boykin Nearing, MD  indomethacin (INDOCIN) 25 MG capsule Take 1 capsule (25 mg total) by mouth 2 (two) times daily with a meal. 07/02/16   Josalyn Funches, MD  metFORMIN (GLUCOPHAGE-XR) 500 MG 24 hr tablet TAKE 2 TABLETS EVERY DAY  AFTER  SUPPER 02/11/16   Josalyn Funches, MD  traMADol (ULTRAM) 50 MG tablet Take 1 tablet (50 mg total) by mouth every 8 (eight) hours as needed. 02/11/16   Josalyn Funches, MD  Triamcinolone Acetonide (TRIAMCINOLONE 0.1 % CREAM : EUCERIN) CREA Apply 1 application topically 2 (two) times daily as needed. Please mix 1:1 Patient not taking: Reported on 08/03/2016 07/02/16   Boykin Nearing, MD  VITAMINS A C E-ZN PO Take by mouth.    Historical Provider, MD    Family History Family History  Problem Relation Age of Onset  . Diabetes Mother   . Diabetes Sister     Social History Social History  Substance Use Topics  . Smoking status: Former Research scientist (life sciences)  . Smokeless tobacco: Never Used  . Alcohol use Yes     Comment: occasionally     Allergies   Lisinopril; Hctz [hydrochlorothiazide]; Naproxen; and Norvasc [amlodipine besylate]   Review of Systems Review of Systems  Constitutional: Positive for diaphoresis. Negative for fever.  Respiratory: Positive for shortness of breath. Negative for hemoptysis.   Cardiovascular: Positive for chest pain.  Gastrointestinal: Negative for abdominal pain and vomiting.  Neurological: Positive for headaches.  All other systems reviewed and are negative.  Physical Exam Updated Vital Signs Temp 97.9 F (36.6 C) (Oral)   Ht 5' 10"  (1.778 m)   Wt 253 lb (114.8 kg)   SpO2 98%   BMI 36.30 kg/m   Physical Exam CONSTITUTIONAL: Elderly, no distress HEAD: Normocephalic/atraumatic EYES:  EOMI/PERRL ENMT: Mucous membranes moist NECK: supple no meningeal signs SPINE/BACK:entire spine nontender CV: S1/S2 noted, no murmurs/rubs/gallops noted LUNGS: Lungs are clear to auscultation bilaterally, no apparent distress ABDOMEN: soft, nontender, no rebound or guarding, bowel sounds noted throughout abdomen GU:no cva tenderness NEURO: Pt is awake/alert/appropriate, moves all extremitiesx4.  No facial droop.   EXTREMITIES: pulses normal/equalx4, full ROM, no calf tenderness SKIN: warm, color normal PSYCH: no abnormalities of mood noted, alert and oriented to situation  ED Treatments / Results  DIAGNOSTIC STUDIES:  Oxygen Saturation is 98% on RA, normal by my interpretation.    COORDINATION OF CARE:  3:59 AM Discussed treatment plan with pt at bedside and pt agreed to plan.  Labs (all labs ordered are listed, but only abnormal results are displayed) Labs Reviewed  BASIC METABOLIC PANEL - Abnormal; Notable for the  following:       Result Value   Potassium 3.3 (*)    Glucose, Bld 146 (*)    Creatinine, Ser 1.13 (*)    GFR calc non Af Amer 49 (*)    GFR calc Af Amer 57 (*)    All other components within normal limits  CBC - Abnormal; Notable for the following:    RBC 3.37 (*)    Hemoglobin 9.6 (*)    HCT 30.3 (*)    All other components within normal limits  I-STAT TROPOININ, ED    EKG  EKG Interpretation  Date/Time:  Friday September 04 2016 02:23:55 EDT Ventricular Rate:  83 PR Interval:    QRS Duration: 95 QT Interval:  396 QTC Calculation: 466 R Axis:   15 Text Interpretation:  Sinus rhythm Atrial premature complex Consider left atrial enlargement No significant change since last tracing Confirmed by Christy Gentles  MD, [place] ([id number]) on 09/04/2016 2:27:26 AM       Radiology Dg Chest 2 View  Result Date: 09/04/2016 CLINICAL DATA:  Acute onset of left-sided chest pain, radiating to the back. Headache. Diaphoresis. Initial encounter. EXAM: CHEST  2 VIEW COMPARISON:   Chest radiograph performed 04/08/2013 FINDINGS: The lungs are well-aerated. Vascular congestion is noted. Minimal left basilar atelectasis is seen. There is no evidence of pleural effusion or pneumothorax. The heart is borderline enlarged. No acute osseous abnormalities are seen. IMPRESSION: Vascular congestion and borderline cardiomegaly. Minimal left basilar atelectasis noted. Electronically Signed   By: Garald Balding M.D.   On: 09/04/2016 03:02    Procedures Procedures (including critical care time)  Medications Ordered in ED Medications  nitroGLYCERIN (NITROSTAT) SL tablet 0.4 mg (0.4 mg Sublingual Given 09/04/16 0445)  nitroGLYCERIN (NITROSTAT) 0.4 MG SL tablet (not administered)     Initial Impression / Assessment and Plan / ED Course  I have reviewed the triage vital signs and the nursing notes.  Pertinent labs & imaging results that were available during my care of the patient were reviewed by me and considered in my medical decision making (see chart for details).     HEART score - 5 Pt had episode of CP in the ED that responded to NTG She will need admission   Pt clinically stable/appropriate at this time D/w dr Hal Hope for admission   Final Clinical Impressions(s) / ED Diagnoses   Final diagnoses:  None    New Prescriptions New Prescriptions   No medications on file  I personally performed the services described in this documentation, which was scribed in my presence. The recorded information has been reviewed and is accurate.        Ripley Fraise, MD 09/04/16 [phone]

## 2016-09-04 NOTE — H&P (Signed)
[facility] History and Physical  Desiree Sanchez JEH:[id number] DOB: 10-Sep-1950 DOA: 09/04/2016  Referring physician:  PCP: Minerva Ends, MD   Chief Complaint: "I had this chest discomfort."  HPI: Desiree Sanchez is a 66 y.o. female Pt had cp and DOE on exertion that started 4 days ago. It kept coming and going with exertion. Pt had diff sleeping the day before admit due to CP, L neck pain, diaphoresis and called EMS. EMS gave ntg which relieved   her sx.  No trauma, hx of vte, arrythmia or MI.  ED Course: CXR showed fluid. Given ntg for CP that reoccured, effective. EKG no STEMI. Got ASA.   Review of Systems:  As per HPI otherwise 10 point review of systems negative.    Past Medical History:  Diagnosis Date  . Arthritis Dx 2006  . Carpal tunnel syndrome   . Diabetes mellitus without complication ([place]) Dx 3785  . Gout   . Hypertension Dx 2005   Past Surgical History:  Procedure Laterality Date  . APPENDECTOMY    . TUBAL LIGATION     Social History:  reports that she has quit smoking. She has never used smokeless tobacco. She reports that she drinks alcohol. She reports that she does not use drugs.  Allergies  Allergen Reactions  . Lisinopril Swelling    REACTION: Swolen lips (only)  . Hctz [Hydrochlorothiazide] Other (See Comments)    Joint pain   . Naproxen Itching and Palpitations  . Norvasc [Amlodipine Besylate] Other (See Comments)    Peripheral edema     Family History  Problem Relation Age of Onset  . Diabetes Mother   . Diabetes Sister      Prior to Admission medications   Medication Sig Start Date End Date Taking? Authorizing Provider  ACCU-CHEK SOFTCLIX LANCETS lancets 1 each by Other route daily. ICD 10 E11.21 07/09/15  Yes Josalyn Funches, MD  Blood Glucose Monitoring Suppl (ACCU-CHEK AVIVA PLUS) w/Device KIT 1 Device by Does not apply route daily. ICD 10 E11.21 07/09/15  Yes Josalyn Funches, MD  carvedilol (COREG) 6.25 MG tablet Take 1  tablet (6.25 mg total) by mouth 2 (two) times daily with a meal. 02/11/16  Yes Josalyn Funches, MD  colchicine 0.6 MG tablet Take 1 tablet (0.6 mg total) by mouth 2 (two) times daily as needed (gout flare). 07/10/15  Yes Josalyn Funches, MD  gabapentin (NEURONTIN) 100 MG capsule Take 3 capsules (300 mg total) by mouth at bedtime. Patient taking differently: Take 100-300 mg by mouth daily as needed (pain).  02/11/16  Yes Josalyn Funches, MD  glucose blood (ACCU-CHEK AVIVA PLUS) test strip 1 each by Other route daily. ICD 10 E11.21 07/09/15  Yes Josalyn Funches, MD  ibuprofen (ADVIL,MOTRIN) 800 MG tablet Take 1 tablet (800 mg total) by mouth every 8 (eight) hours as needed. Needs office visit for refills Patient taking differently: Take 800 mg by mouth every 8 (eight) hours as needed for moderate pain. Needs office visit for refills 02/11/16  Yes Boykin Nearing, MD  indomethacin (INDOCIN) 25 MG capsule Take 1 capsule (25 mg total) by mouth 2 (two) times daily with a meal. Patient taking differently: Take 25 mg by mouth 2 (two) times daily as needed for mild pain.  07/02/16  Yes Josalyn Funches, MD  metFORMIN (GLUCOPHAGE-XR) 500 MG 24 hr tablet TAKE 2 TABLETS EVERY DAY  AFTER  SUPPER 02/11/16  Yes Josalyn Funches, MD  traMADol (ULTRAM) 50 MG tablet Take 1 tablet (50  mg total) by mouth every 8 (eight) hours as needed. Patient taking differently: Take 50 mg by mouth every 8 (eight) hours as needed for moderate pain.  02/11/16  Yes Josalyn Funches, MD  Triamcinolone Acetonide (TRIAMCINOLONE 0.1 % CREAM : EUCERIN) CREA Apply 1 application topically 2 (two) times daily as needed. Please mix 1:1 Patient taking differently: Apply 1 application topically 2 (two) times daily as needed. Please mix 1:1 07/02/16  Yes Boykin Nearing, MD   Physical Exam: Vitals:   09/04/16 0600 09/04/16 0645 09/04/16 0700 09/04/16 0745  BP: (!) 151/71 (!) 158/74 (!) 156/80 (!) 148/64  Pulse: 84 87 85 79  Resp: 15 14 (!) 21 16  Temp:        TempSrc:      SpO2: 100% 97% 100% 93%  Weight:      Height:        Wt Readings from Last 3 Encounters:  09/04/16 114.8 kg (253 lb)  08/03/16 108 kg (238 lb 3.2 oz)  06/16/16 108.6 kg (239 lb 6.4 oz)    General:  Appears calm and comfortable; A&Ox3 Eyes:  PERRL, EOMI, normal lids, iris ENT:  grossly normal hearing, lips & tongue Neck:  no LAD, masses or thyromegaly Cardiovascular:  RRR, no m/r/g. No LE edema.  Respiratory:  CTA bilaterally, no w/r/r. Normal respiratory effort. Abdomen:  soft, ntnd Skin:  no rash or induration seen on limited exam Musculoskeletal:  grossly normal tone BUE/BLE Psychiatric:  grossly normal mood and affect, speech fluent and appropriate Neurologic:  CN 2-12 grossly intact, moves all extremities in coordinated fashion.          Labs on Admission:  Basic Metabolic Panel:  Recent Labs Lab 09/04/16 0233  NA 144  K 3.3*  CL 106  CO2 26  GLUCOSE 146*  BUN 16  CREATININE 1.13*  CALCIUM 9.2   Liver Function Tests: No results for input(s): AST, ALT, ALKPHOS, BILITOT, PROT, ALBUMIN in the last 168 hours. No results for input(s): LIPASE, AMYLASE in the last 168 hours. No results for input(s): AMMONIA in the last 168 hours. CBC:  Recent Labs Lab 09/04/16 0233  WBC 6.2  HGB 9.6*  HCT 30.3*  MCV 89.9  PLT 233   Cardiac Enzymes: No results for input(s): CKTOTAL, CKMB, CKMBINDEX, TROPONINI in the last 168 hours.  BNP (last 3 results) No results for input(s): BNP in the last 8760 hours.  ProBNP (last 3 results) No results for input(s): PROBNP in the last 8760 hours.   Serum creatinine: 1.13 mg/dL (H) 09/04/16 0233 Estimated creatinine clearance: 65.4 mL/min (A)  CBG: No results for input(s): GLUCAP in the last 168 hours.  Radiological Exams on Admission: Dg Chest 2 View  Result Date: 09/04/2016 CLINICAL DATA:  Acute onset of left-sided chest pain, radiating to the back. Headache. Diaphoresis. Initial encounter. EXAM: CHEST  2  VIEW COMPARISON:  Chest radiograph performed 04/08/2013 FINDINGS: The lungs are well-aerated. Vascular congestion is noted. Minimal left basilar atelectasis is seen. There is no evidence of pleural effusion or pneumothorax. The heart is borderline enlarged. No acute osseous abnormalities are seen. IMPRESSION: Vascular congestion and borderline cardiomegaly. Minimal left basilar atelectasis noted. Electronically Signed   By: Garald Balding M.D.   On: 09/04/2016 03:02    EKG: Independently reviewed. NSTEMI. Flipped t wave lead III, new compared to 2014/11 EKG  Assessment/Plan Active Problems:   Chest pain  1) CP/DOE Given hear score of 4 in ED. - serial trop ordered, initial neg -  prn EKG CP - prn moprhine CP - prn ntg cp - asa in ED and QD - ECHO ordered for AM - tele bed, cardiac monitoring - ambien for sleep prn - zofran prn for nausea Cardiology consulted In AM A1c and lipid panel  Gout Prn colcrys for flare  DM SSI ACHS Holding metformin  Hypertension When necessary hydralazine 10 mg IV as needed for severe blood pressure   Code Status: FULL  DVT Prophylaxis: heparin Family Communication: none available Disposition Plan: Pending Improvement  Status: tele obs  Elwin Mocha, MD Family Medicine [facility] [email or web address] Password [email or web address]

## 2016-09-04 NOTE — Consult Note (Signed)
CARDIOLOGY CONSULT NOTE   Patient ID: [name] MRN: [id number] DOB/AGE: 68-Apr-1952 68 y.o.  Admit date: 09/04/2016  Primary Physician   Minerva Ends, MD Primary Cardiologist   New to [facility] Reason for Consultation   Chest pain  Requesting Physician  Dr. Aggie Moats HPI: Desiree Sanchez is a 66 y.o. female with a history of DM, HTN and prior tobacco abuse presents for chest pain.   No prior cardiac issue. History of tobacco smoking for 50 years (she was smoking one to 2 cigarettes a day). Quit 2009. Mother had a MI at age 66 and died of that. She was used to swim 3 times per week without any issue. Hasn't swim in past 2 months.  Patient was in usual state of health up until 3 days ago when she has noted exertional dyspnea without chest pain which relieved with rest. During this period of time, she has not had left upper chest discomfort with laying down without shortness of breath. She denies orthopnea, PND, syncope, dizziness, palpitation, or lower extremity edema, melena or bleeding.  Patient had similar episode of left upper chest discomfort last night. She described as "achy discomfort ". However pain persisted for 3-4 hours and EMS was called. She also had a headache. She was noted hypertensive 240/104. Her chest pain resolved after sublingual nitroglycerin x 2.   Point-of-care troponin negative. Troponin 0.2. Potassium 3.3. Supplement given. Chest x-ray shows vascular congestion. EKG shows normal sinus rhythm at rate of 83 bpm. No acute abnormality. Hemoglobin 9.9.   Past Medical History:  Diagnosis Date  . Arthritis Dx 2006  . Carpal tunnel syndrome   . Diabetes mellitus without complication ([place]) Dx 2409  . Dyspnea   . Gout   . Heart murmur   . Hypertension Dx 2005  . Unstable angina ([place]) 08/2016     Past Surgical History:  Procedure Laterality Date  . APPENDECTOMY    . TUBAL LIGATION      Allergies  Allergen Reactions  . Lisinopril Swelling    REACTION:  Swolen lips (only)  . Hctz [Hydrochlorothiazide] Other (See Comments)    Joint pain   . Naproxen Itching and Palpitations  . Norvasc [Amlodipine Besylate] Other (See Comments)    Peripheral edema     I have reviewed the patient's current medications . [START ON 09/05/2016] aspirin EC  325 mg Oral Daily  . carvedilol  6.25 mg Oral BID WC  . heparin  5,000 Units Subcutaneous Q8H  . insulin aspart  0-15 Units Subcutaneous TID WC  . nitroGLYCERIN        acetaminophen, colchicine, hydrALAZINE, morphine injection, nitroGLYCERIN, ondansetron (ZOFRAN) IV, zolpidem  Prior to Admission medications   Medication Sig Start Date End Date Taking? Authorizing Provider  ACCU-CHEK SOFTCLIX LANCETS lancets 1 each by Other route daily. ICD 10 E11.21 07/09/15  Yes Josalyn Funches, MD  Blood Glucose Monitoring Suppl (ACCU-CHEK AVIVA PLUS) w/Device KIT 1 Device by Does not apply route daily. ICD 10 E11.21 07/09/15  Yes Josalyn Funches, MD  carvedilol (COREG) 6.25 MG tablet Take 1 tablet (6.25 mg total) by mouth 2 (two) times daily with a meal. 02/11/16  Yes Josalyn Funches, MD  colchicine 0.6 MG tablet Take 1 tablet (0.6 mg total) by mouth 2 (two) times daily as needed (gout flare). 07/10/15  Yes Josalyn Funches, MD  gabapentin (NEURONTIN) 100 MG capsule Take 3 capsules (300 mg total) by mouth at bedtime. Patient taking differently: Take 100-300 mg by mouth daily  as needed (pain).  02/11/16  Yes Josalyn Funches, MD  glucose blood (ACCU-CHEK AVIVA PLUS) test strip 1 each by Other route daily. ICD 10 E11.21 07/09/15  Yes Josalyn Funches, MD  ibuprofen (ADVIL,MOTRIN) 800 MG tablet Take 1 tablet (800 mg total) by mouth every 8 (eight) hours as needed. Needs office visit for refills Patient taking differently: Take 800 mg by mouth every 8 (eight) hours as needed for moderate pain. Needs office visit for refills 02/11/16  Yes Boykin Nearing, MD  indomethacin (INDOCIN) 25 MG capsule Take 1 capsule (25 mg total) by mouth 2 (two)  times daily with a meal. Patient taking differently: Take 25 mg by mouth 2 (two) times daily as needed for mild pain.  07/02/16  Yes Josalyn Funches, MD  metFORMIN (GLUCOPHAGE-XR) 500 MG 24 hr tablet TAKE 2 TABLETS EVERY DAY  AFTER  SUPPER 02/11/16  Yes Josalyn Funches, MD  traMADol (ULTRAM) 50 MG tablet Take 1 tablet (50 mg total) by mouth every 8 (eight) hours as needed. Patient taking differently: Take 50 mg by mouth every 8 (eight) hours as needed for moderate pain.  02/11/16  Yes Josalyn Funches, MD  Triamcinolone Acetonide (TRIAMCINOLONE 0.1 % CREAM : EUCERIN) CREA Apply 1 application topically 2 (two) times daily as needed. Please mix 1:1 Patient taking differently: Apply 1 application topically 2 (two) times daily as needed. Please mix 1:1 07/02/16  Yes Boykin Nearing, MD     Social History   Social History  . Marital status: Single    Spouse name: N/A  . Number of children: N/A  . Years of education: N/A   Occupational History  . Not on file.   Social History Main Topics  . Smoking status: Former Research scientist (life sciences)  . Smokeless tobacco: Never Used     Comment: QUIT SMOKING IN 2009  . Alcohol use Yes     Comment: occasionally  . Drug use: No  . Sexual activity: Not on file   Other Topics Concern  . Not on file   Social History Narrative  . No narrative on file    Family Status  Relation Status  . Mother Deceased  . Father Deceased  . Sister    Family History  Problem Relation Age of Onset  . Diabetes Mother   . Diabetes Sister      ROS:  Full 14 point review of systems complete and found to be negative unless listed above.  Physical Exam: Blood pressure (!) 162/81, pulse 83, temperature 97.9 F (36.6 C), temperature source Oral, resp. rate 18, height 5' 10"  (1.778 m), weight 253 lb (114.8 kg), SpO2 93 %.  General: Well developed, well nourished, female in no acute distress Head: Eyes PERRLA, No xanthomas. Normocephalic and atraumatic, oropharynx without edema or exudate.    Lungs: Resp regular and unlabored, CTA. Heart: RRR no s3, s4, Systolic murmurs. Neck: No carotid bruits. No lymphadenopathy. No  JVD. Abdomen: Bowel sounds present, abdomen soft and non-tender without masses or hernias noted. Msk:  No spine or cva tenderness. No weakness, no joint deformities or effusions. Extremities: No clubbing, cyanosis or edema. DP/PT/Radials 2+ and equal bilaterally. Neuro: Alert and oriented X 3. No focal deficits noted. Psych:  Good affect, responds appropriately Skin: No rashes or lesions noted.  Labs:   Lab Results  Component Value Date   WBC 7.0 09/04/2016   HGB 9.9 (L) 09/04/2016   HCT 30.9 (L) 09/04/2016   MCV 90.1 09/04/2016   PLT 241 09/04/2016   No  results for input(s): INR in the last 72 hours.  Recent Labs Lab 09/04/16 0233 09/04/16 0821  NA 144  --   K 3.3*  --   CL 106  --   CO2 26  --   BUN 16  --   CREATININE 1.13* 1.08*  CALCIUM 9.2  --   GLUCOSE 146*  --    Magnesium  Date Value Ref Range Status  09/04/2016 2.0 1.7 - 2.4 mg/dL Final    Recent Labs  09/04/16 0821  TROPONINI 0.20*    Recent Labs  09/04/16 0248  TROPIPOC 0.00   No results found for: PROBNP Lab Results  Component Value Date   CHOL 179 04/13/2014   HDL 37 (L) 04/13/2014   LDLCALC 100 (H) 04/13/2014   TRIG 211 (H) 04/13/2014   No results found for: DDIMER No results found for: LIPASE, AMYLASE No results found for: TSH, T4TOTAL, T3FREE, THYROIDAB No results found for: VITAMINB12, FOLATE, FERRITIN, TIBC, IRON, RETICCTPCT  Echo: Pending  Radiology:  Dg Chest 2 View  Result Date: 09/04/2016 CLINICAL DATA:  Acute onset of left-sided chest pain, radiating to the back. Headache. Diaphoresis. Initial encounter. EXAM: CHEST  2 VIEW COMPARISON:  Chest radiograph performed 04/08/2013 FINDINGS: The lungs are well-aerated. Vascular congestion is noted. Minimal left basilar atelectasis is seen. There is no evidence of pleural effusion or pneumothorax. The heart  is borderline enlarged. No acute osseous abnormalities are seen. IMPRESSION: Vascular congestion and borderline cardiomegaly. Minimal left basilar atelectasis noted. Electronically Signed   By: Garald Balding M.D.   On: 09/04/2016 03:02    ASSESSMENT AND PLAN:     1. Chest pain - Concerning for unstable angina. However, atypical presentation in setting of uncontrolled blood pressure. Her chest pain resolved with sublingual nitroglycerin. ? Angina vs due to high blood pressure. Point-of-care troponin negative. Troponin I 0.2. EKG without acute ischemic changes. - She is currently chest pain-free. Cardiac risk factor includes hypertension, diabetes, remote history of tobacco smoking and family history of CAD. - Continue to cycle troponin. Stress test versus cardiac catheterization depending on enzyme trend. Nothing by mouth after midnight. Pending echocardiogram. Will get lipid panel for risk stratification.  2. Systolic murmur - Pending echocardiogram for further evaluation.  3. Uncontrolled hypertension - Minimally controlled. Will increase Coreg to 12.81m BID.   4. Diabetics - Per primary team.  5. Anemia - Denies melena or active bleeding. Evaluation per primary team.  Signed:Leanor Kail PBen [place]3/30/2018, 3:08 PM Pager [phone] Patient seen and examined and history reviewed. Agree with above findings and plan. Very pleasant 66yo BF with history of HTN, DM, prior smoker, and former tobacco abuse is seen for evaluation of chest pain and dyspnea at the request of Dr. HAggie Moats She reports a 2-3 week history of dyspnea on exertion. Later developed left precordial chest pressure - initially when lying down and now with exertion as well. On admission she was severely hypertensive. Now symptoms improved. She has been active in the past but not in last 2 months due to schedule.   On exam she is in NAD No JVD or bruits Lungs are clear.  CV RRR no gallop or murmur No edema, pulses 2+ no  abdominal bruits  Ecg shows NSR with normal Ecg. I have personally reviewed and interpreted this study. Troponin POC 0, repeat  Troponin 0.2  Impression: patient presents with progressive dyspnea on exertion and chest pressure concerning for angina. The question is whether this is due to  significant CAD or just demand ischemia related to hypertensive heart disease with severely elevated BP and anemia. Symptoms have improved with BP control. Will increase Coreg now. Echo ordered and is pending.  If troponin trend is up or there is LV dysfunction on Echo I would recommend a cardiac cath on Monday.  If troponin trend is flat or down with good LV function then I think it would be reasonable to order a Myoview study tomorrow for ischemic evaluation. Will follow.  Peter Martinique, [place] 09/04/2016 3:39 PM

## 2016-09-05 ENCOUNTER — Observation Stay (HOSPITAL_BASED_OUTPATIENT_CLINIC_OR_DEPARTMENT_OTHER): Payer: Medicare HMO

## 2016-09-05 DIAGNOSIS — I200 Unstable angina: Secondary | ICD-10-CM | POA: Diagnosis not present

## 2016-09-05 DIAGNOSIS — I252 Old myocardial infarction: Secondary | ICD-10-CM | POA: Diagnosis not present

## 2016-09-05 DIAGNOSIS — R778 Other specified abnormalities of plasma proteins: Secondary | ICD-10-CM

## 2016-09-05 DIAGNOSIS — E119 Type 2 diabetes mellitus without complications: Secondary | ICD-10-CM

## 2016-09-05 DIAGNOSIS — R7989 Other specified abnormal findings of blood chemistry: Secondary | ICD-10-CM | POA: Diagnosis not present

## 2016-09-05 DIAGNOSIS — R011 Cardiac murmur, unspecified: Secondary | ICD-10-CM | POA: Diagnosis not present

## 2016-09-05 DIAGNOSIS — R079 Chest pain, unspecified: Secondary | ICD-10-CM | POA: Diagnosis not present

## 2016-09-05 DIAGNOSIS — I119 Hypertensive heart disease without heart failure: Secondary | ICD-10-CM | POA: Diagnosis not present

## 2016-09-05 DIAGNOSIS — D649 Anemia, unspecified: Secondary | ICD-10-CM | POA: Diagnosis not present

## 2016-09-05 DIAGNOSIS — N179 Acute kidney failure, unspecified: Secondary | ICD-10-CM | POA: Diagnosis not present

## 2016-09-05 DIAGNOSIS — E876 Hypokalemia: Secondary | ICD-10-CM | POA: Diagnosis not present

## 2016-09-05 DIAGNOSIS — R0600 Dyspnea, unspecified: Secondary | ICD-10-CM | POA: Diagnosis not present

## 2016-09-05 DIAGNOSIS — I25110 Atherosclerotic heart disease of native coronary artery with unstable angina pectoris: Secondary | ICD-10-CM | POA: Diagnosis not present

## 2016-09-05 DIAGNOSIS — I10 Essential (primary) hypertension: Secondary | ICD-10-CM | POA: Diagnosis not present

## 2016-09-05 LAB — BASIC METABOLIC PANEL
ANION GAP: 9 (ref 5–15)
BUN: 14 mg/dL (ref 6–20)
CALCIUM: 9.2 mg/dL (ref 8.9–10.3)
CO2: 24 mmol/L (ref 22–32)
CREATININE: 1.05 mg/dL — AB (ref 0.44–1.00)
Chloride: 109 mmol/L (ref 101–111)
GFR calc non Af Amer: 53 mL/min — ABNORMAL LOW (ref >60)
Glucose, Bld: 114 mg/dL — ABNORMAL HIGH (ref 65–99)
Potassium: 3.7 mmol/L (ref 3.5–5.1)
SODIUM: 142 mmol/L (ref 135–145)

## 2016-09-05 LAB — ECHOCARDIOGRAM COMPLETE
Height: 70 in
WEIGHTICAEL: 4048 [oz_av]

## 2016-09-05 LAB — GLUCOSE, CAPILLARY
GLUCOSE-CAPILLARY: 124 mg/dL — AB (ref 65–99)
Glucose-Capillary: 106 mg/dL — ABNORMAL HIGH (ref 65–99)
Glucose-Capillary: 130 mg/dL — ABNORMAL HIGH (ref 65–99)
Glucose-Capillary: 110 mg/dL — ABNORMAL HIGH (ref 65–99)

## 2016-09-05 LAB — LIPID PANEL
CHOLESTEROL: 108 mg/dL (ref 0–200)
HDL: 31 mg/dL — ABNORMAL LOW (ref >40)
LDL CALC: 55 mg/dL (ref 0–99)
Total CHOL/HDL Ratio: 3.5 RATIO
Triglycerides: 111 mg/dL (ref <150)
VLDL: 22 mg/dL (ref 0–40)

## 2016-09-05 MED ORDER — REGADENOSON 0.4 MG/5ML IV SOLN
0.4000 mg | Freq: Once | INTRAVENOUS | Status: AC
  Administered 2016-09-06: 0.4 mg via INTRAVENOUS
  Filled 2016-09-05: qty 5

## 2016-09-05 MED ORDER — ISOSORB DINITRATE-HYDRALAZINE 20-37.5 MG PO TABS
1.0000 | ORAL_TABLET | Freq: Three times a day (TID) | ORAL | Status: DC
  Administered 2016-09-05 – 2016-09-08 (×10): 1 via ORAL
  Filled 2016-09-05 (×11): qty 1

## 2016-09-05 MED ORDER — SIMETHICONE 80 MG PO CHEW
80.0000 mg | CHEWABLE_TABLET | Freq: Four times a day (QID) | ORAL | Status: DC | PRN
  Administered 2016-09-05: 80 mg via ORAL
  Filled 2016-09-05 (×2): qty 1

## 2016-09-05 NOTE — Progress Notes (Addendum)
Patient ID: Desiree Sanchez, female   DOB: [birth year], 66 y.o.   MRN: [id number]    PROGRESS NOTE  [name]  FBP:[id number] DOB: 12-20-1950 DOA: 09/04/2016  PCP: Minerva Ends, MD   Brief Narrative: 66 y.o. female presented for evaluation of several days duration of intermittent chest pressure, worse with exertion and radiating to left neck, associated with diaphoresis. NTG relieved pain. Cardiology consulted.   Assessment & Plan:  Chest pain - Concerning for unstable angina. - ? demand ischemia related to hypertensive heart disease with severely elevated BP and anemia vs CAD - ECHO requested, will likely be done later today or tomorrow - cardiology team following - pt with no chest pain this AM  Systolic murmur - Pending echocardiogram for further evaluation.  Uncontrolled hypertension - increased Coreg to 12.5mg  BID - SBP still in 150's  Hypokalemia - supplemented, WNL this AM  DM type II - on metformin at home - A1C pend - SSI while inpatient   Acute kidney injury, pre renal in etiology  - CR trending down - BMP in AM  Anemia, ? IDA - Denies melena or active bleeding - anemia panel with next blood work  DVT prophylaxis: heparin SQ Code Status: full Family Communication: Patient at bedside  Disposition Plan: home when cardio team clears   Consultants:   Cardiology   Procedures:   None  Antimicrobials:   None  Subjective: No events overnight, pt denies chest pain.  Objective: Vitals:   09/04/16 1909 09/04/16 2358 09/05/16 0323 09/05/16 0741  BP: 134/79 (!) 143/67 (!) 142/60 (!) 155/80  Pulse: 79 81 89 71  Resp: 19 (!) 22 17 20   Temp: 97.6 F (36.4 C) 98.5 F (36.9 C) 98.5 F (36.9 C) 98.4 F (36.9 C)  TempSrc: Oral Oral Oral Oral  SpO2: 97% 96% 97% 96%  Weight:      Height:        Intake/Output Summary (Last 24 hours) at 09/05/16 1107 Last data filed at 09/04/16 1853  Gross per 24 hour  Intake              720 ml  Output               300 ml  Net              420 ml   Filed Weights   09/04/16 0223  Weight: 114.8 kg (253 lb)    Examination:  General exam: Appears calm and comfortable  Respiratory system: Clear to auscultation. Respiratory effort normal. Cardiovascular system: S1 & S2 heard, RRR. No JVD, rubs, gallops or clicks. No pedal edema. Gastrointestinal system: Abdomen is nondistended, soft and nontender. No organomegaly or masses felt. Normal bowel sounds heard. Central nervous system: Alert and oriented. No focal neurological deficits. Extremities: Symmetric 5 x 5 power. Skin: No rashes, lesions or ulcers Psychiatry: Judgement and insight appear normal. Mood & affect appropriate.    Data Reviewed: I have personally reviewed following labs and imaging studies  CBC:  Recent Labs Lab 09/04/16 0233 09/04/16 0821  WBC 6.2 7.0  HGB 9.6* 9.9*  HCT 30.3* 30.9*  MCV 89.9 90.1  PLT 233 824   Basic Metabolic Panel:  Recent Labs Lab 09/04/16 0233 09/04/16 0821 09/05/16 0228  NA 144  --  142  K 3.3*  --  3.7  CL 106  --  109  CO2 26  --  24  GLUCOSE 146*  --  114*  BUN 16  --  14  CREATININE 1.13* 1.08* 1.05*  CALCIUM 9.2  --  9.2  MG  --  2.0  --    Cardiac Enzymes:  Recent Labs Lab 09/04/16 0821 09/04/16 1535 09/04/16 2018  TROPONINI 0.20* 0.17* 0.20*   CBG:  Recent Labs Lab 09/04/16 1242 09/04/16 1749 09/04/16 2004 09/05/16 0843  GLUCAP 155* 105* 172* 124*   Lipid Profile:  Recent Labs  09/05/16 0228  CHOL 108  HDL 31*  LDLCALC 55  TRIG 111  CHOLHDL 3.5    Recent Results (from the past 240 hour(s))  MRSA PCR Screening     Status: None   Collection Time: 09/04/16 12:20 PM  Result Value Ref Range Status   MRSA by PCR NEGATIVE NEGATIVE Final    Comment:        The GeneXpert MRSA Assay (FDA approved for NASAL specimens only), is one component of a comprehensive MRSA colonization surveillance program. It is not intended to diagnose MRSA infection  nor to guide or monitor treatment for MRSA infections.     Radiology Studies: Dg Chest 2 View  Result Date: 09/04/2016 CLINICAL DATA:  Acute onset of left-sided chest pain, radiating to the back. Headache. Diaphoresis. Initial encounter. EXAM: CHEST  2 VIEW COMPARISON:  Chest radiograph performed 04/08/2013 FINDINGS: The lungs are well-aerated. Vascular congestion is noted. Minimal left basilar atelectasis is seen. There is no evidence of pleural effusion or pneumothorax. The heart is borderline enlarged. No acute osseous abnormalities are seen. IMPRESSION: Vascular congestion and borderline cardiomegaly. Minimal left basilar atelectasis noted. Electronically Signed   By: Garald Balding M.D.   On: 09/04/2016 03:02   Scheduled Meds: . aspirin EC  325 mg Oral Daily  . carvedilol  12.5 mg Oral BID WC  . heparin  5,000 Units Subcutaneous Q8H  . insulin aspart  0-15 Units Subcutaneous TID WC   Continuous Infusions:   LOS: 0 days   Time spent: 20 minutes   Faye Ramsay, MD [facility] Pager [phone]  If 7PM-7AM, please contact night-coverage [email or web address] Password [email or web address] 09/05/2016, 11:07 AM

## 2016-09-05 NOTE — Progress Notes (Addendum)
Progress Note  Patient Name: Desiree Sanchez Date of Encounter: 09/05/2016  Primary Cardiologist: [place]  Subjective   No chest pain   Inpatient Medications    Scheduled Meds: . aspirin EC  325 mg Oral Daily  . carvedilol  12.5 mg Oral BID WC  . heparin  5,000 Units Subcutaneous Q8H  . insulin aspart  0-15 Units Subcutaneous TID WC  . [START ON 09/06/2016] regadenoson  0.4 mg Intravenous Once   Continuous Infusions:  PRN Meds: acetaminophen, colchicine, hydrALAZINE, morphine injection, nitroGLYCERIN, ondansetron (ZOFRAN) IV, zolpidem   Vital Signs    Vitals:   09/04/16 1909 09/04/16 2358 09/05/16 0323 09/05/16 0741  BP: 134/79 (!) 143/67 (!) 142/60 (!) 155/80  Pulse: 79 81 89 71  Resp: 19 (!) 22 17 20   Temp: 97.6 F (36.4 C) 98.5 F (36.9 C) 98.5 F (36.9 C) 98.4 F (36.9 C)  TempSrc: Oral Oral Oral Oral  SpO2: 97% 96% 97% 96%  Weight:      Height:        Intake/Output Summary (Last 24 hours) at 09/05/16 1114 Last data filed at 09/04/16 1853  Gross per 24 hour  Intake              720 ml  Output              300 ml  Net              420 ml   Filed Weights   09/04/16 0223  Weight: 253 lb (114.8 kg)    Telemetry    NSR 09/05/2016  - Personally Reviewed  ECG    NSR anterolateral T wave changes  - Personally Reviewed  Physical Exam  Black female  GEN: No acute distress.   Neck: No JVD Cardiac: RRR, S4 gallop SEM  murmurs, rubs, or gallops.  Respiratory: Clear to auscultation bilaterally. GI: Soft, nontender, non-distended  MS: No edema; No deformity. Neuro:  Nonfocal  Psych: Normal affect   Labs    Chemistry  Recent Labs Lab 09/04/16 0233 09/04/16 0821 09/05/16 0228  NA 144  --  142  K 3.3*  --  3.7  CL 106  --  109  CO2 26  --  24  GLUCOSE 146*  --  114*  BUN 16  --  14  CREATININE 1.13* 1.08* 1.05*  CALCIUM 9.2  --  9.2  GFRNONAA 49* 52* 53*  GFRAA 57* 60* >60  ANIONGAP 12  --  9     Hematology  Recent Labs Lab  09/04/16 0233 09/04/16 0821  WBC 6.2 7.0  RBC 3.37* 3.43*  HGB 9.6* 9.9*  HCT 30.3* 30.9*  MCV 89.9 90.1  MCH 28.5 28.9  MCHC 31.7 32.0  RDW 14.8 14.6  PLT 233 241    Cardiac Enzymes  Recent Labs Lab 09/04/16 0821 09/04/16 1535 09/04/16 2018  TROPONINI 0.20* 0.17* 0.20*     Recent Labs Lab 09/04/16 0248  TROPIPOC 0.00     BNPNo results for input(s): BNP, PROBNP in the last 168 hours.   DDimer No results for input(s): DDIMER in the last 168 hours.   Radiology    Dg Chest 2 View  Result Date: 09/04/2016 CLINICAL DATA:  Acute onset of left-sided chest pain, radiating to the back. Headache. Diaphoresis. Initial encounter. EXAM: CHEST  2 VIEW COMPARISON:  Chest radiograph performed 04/08/2013 FINDINGS: The lungs are well-aerated. Vascular congestion is noted. Minimal left basilar atelectasis is seen. There is no evidence  of pleural effusion or pneumothorax. The heart is borderline enlarged. No acute osseous abnormalities are seen. IMPRESSION: Vascular congestion and borderline cardiomegaly. Minimal left basilar atelectasis noted. Electronically Signed   By: Garald Balding M.D.   On: 09/04/2016 03:02    Cardiac Studies   Echo pending   Patient Profile     66 y.o. female with hypertensive urgency positive troponin , abnormal ECG and chest pain with dyspnea  Assessment & Plan    1) Elevated troponin : unfortunately plans left up in air by cards yesterday Troponin is flat but echo not done will feed Patient and have ordered echo stat. Have ordered myovue for am if echo shows normal EF otherwise will plan cath Monday/Tuesday per Dr Doug Sou notes 2. HTN:  ? Angioedema with lisinopril Edema with norvasc  Continue beta blocker add bidil   3) DM:  Discussed low carb diet.  Target hemoglobin A1c is 6.5 or less.  Continue current medications.]  Signed, Jenkins Rouge, MD  09/05/2016, 11:14 AM

## 2016-09-05 NOTE — Progress Notes (Signed)
  Echocardiogram 2D Echocardiogram has been performed.  Johny Chess 09/05/2016, 1:45 PM

## 2016-09-05 NOTE — Progress Notes (Signed)
IM paged me to notify me that echo cannot be done until later tonight or tomorrow, patient is frustrated and wants to eat. Pain free, VSS. I told her she can allow patient to eat and we will await echo to further decide plan of action. Dayna Dunn PA-C

## 2016-09-06 ENCOUNTER — Observation Stay (HOSPITAL_BASED_OUTPATIENT_CLINIC_OR_DEPARTMENT_OTHER): Payer: Medicare HMO

## 2016-09-06 DIAGNOSIS — R079 Chest pain, unspecified: Secondary | ICD-10-CM

## 2016-09-06 DIAGNOSIS — I10 Essential (primary) hypertension: Secondary | ICD-10-CM | POA: Diagnosis not present

## 2016-09-06 DIAGNOSIS — R778 Other specified abnormalities of plasma proteins: Secondary | ICD-10-CM | POA: Diagnosis not present

## 2016-09-06 DIAGNOSIS — E119 Type 2 diabetes mellitus without complications: Secondary | ICD-10-CM | POA: Diagnosis not present

## 2016-09-06 DIAGNOSIS — I200 Unstable angina: Secondary | ICD-10-CM | POA: Diagnosis not present

## 2016-09-06 LAB — HEMOGLOBIN A1C
Hgb A1c MFr Bld: 6.3 % — ABNORMAL HIGH (ref 4.8–5.6)
MEAN PLASMA GLUCOSE: 134 mg/dL

## 2016-09-06 LAB — NM MYOCAR MULTI W/SPECT W/WALL MOTION / EF: CHL CUP RESTING HR STRESS: 75 {beats}/min

## 2016-09-06 LAB — GLUCOSE, CAPILLARY
GLUCOSE-CAPILLARY: 105 mg/dL — AB (ref 65–99)
GLUCOSE-CAPILLARY: 152 mg/dL — AB (ref 65–99)
Glucose-Capillary: 101 mg/dL — ABNORMAL HIGH (ref 65–99)
Glucose-Capillary: 148 mg/dL — ABNORMAL HIGH (ref 65–99)

## 2016-09-06 MED ORDER — TECHNETIUM TC 99M TETROFOSMIN IV KIT
30.0000 | PACK | Freq: Once | INTRAVENOUS | Status: AC | PRN
  Administered 2016-09-06: 30 via INTRAVENOUS

## 2016-09-06 MED ORDER — REGADENOSON 0.4 MG/5ML IV SOLN
INTRAVENOUS | Status: AC
  Filled 2016-09-06: qty 5

## 2016-09-06 MED ORDER — TECHNETIUM TC 99M TETROFOSMIN IV KIT
10.0000 | PACK | Freq: Once | INTRAVENOUS | Status: AC | PRN
  Administered 2016-09-06: 10 via INTRAVENOUS

## 2016-09-06 NOTE — Progress Notes (Addendum)
Study Result     There was no ST segment deviation noted during stress.  T wave inversion was noted during stress in the V3, V4, V5 and V6 leads.  Findings consistent with ischemia and prior myocardial infarction.  This is a low risk study.  The left ventricular ejection fraction is normal (55-65%).   Poor quality study with extracardiac activity and diaphragmatic attenuation Cannot r/o inferior MI vs diaphragmatic attenuation small area of apical ischemia EF 57% with apical hypokinesis      Dr. Johnsie Cancel recommends LHC to further evaluate. I called into patient's room and discussed results and cath in detail with her. Risks and benefits of cardiac catheterization have been discussed with the patient.  These include bleeding, infection, kidney damage, stroke, heart attack, death.  The patient understands these risks and is willing to proceed. Also informed nurse of plan who will pass on to IM. OK to resume diet today, will keep NPO after midnight. I have put her on the add-on board so we will not know exact timing of cath until AM.   Also needs f/u CBC reviewed in AM.  Melina Copa PA-C

## 2016-09-06 NOTE — Progress Notes (Signed)
Progress Note  Patient Name: Desiree Sanchez Date of Encounter: 09/06/2016  Primary Cardiologist: [place]  Subjective   No chest pain getting nuclear study   Inpatient Medications    Scheduled Meds: . aspirin EC  325 mg Oral Daily  . carvedilol  12.5 mg Oral BID WC  . heparin  5,000 Units Subcutaneous Q8H  . insulin aspart  0-15 Units Subcutaneous TID WC  . isosorbide-hydrALAZINE  1 tablet Oral TID  . regadenoson  0.4 mg Intravenous Once   Continuous Infusions:  PRN Meds: acetaminophen, colchicine, hydrALAZINE, morphine injection, nitroGLYCERIN, ondansetron (ZOFRAN) IV, simethicone, zolpidem   Vital Signs    Vitals:   09/06/16 0125 09/06/16 0523 09/06/16 0732 09/06/16 0733  BP: (!) 142/68 135/64  138/66  Pulse: 80 79  72  Resp: (!) 29 18  (!) 28  Temp: 98.7 F (37.1 C) 98.2 F (36.8 C) 98.5 F (36.9 C) 98.5 F (36.9 C)  TempSrc: Oral Oral Oral Oral  SpO2: 95% 94%  97%  Weight:      Height:       No intake or output data in the 24 hours ending 09/06/16 1102 Filed Weights   09/04/16 0223  Weight: 253 lb (114.8 kg)    Telemetry    NSR 09/06/2016  - Personally Reviewed  ECG    NSR anterolateral T wave changes  - Personally Reviewed  Physical Exam  Black female  GEN: No acute distress.   Neck: No JVD Cardiac: RRR, S4 gallop SEM  murmurs, rubs, or gallops.  Respiratory: Clear to auscultation bilaterally. GI: Soft, nontender, non-distended  MS: No edema; No deformity. Neuro:  Nonfocal  Psych: Normal affect   Labs    Chemistry  Recent Labs Lab 09/04/16 0233 09/04/16 0821 09/05/16 0228  NA 144  --  142  K 3.3*  --  3.7  CL 106  --  109  CO2 26  --  24  GLUCOSE 146*  --  114*  BUN 16  --  14  CREATININE 1.13* 1.08* 1.05*  CALCIUM 9.2  --  9.2  GFRNONAA 49* 52* 53*  GFRAA 57* 60* >60  ANIONGAP 12  --  9     Hematology  Recent Labs Lab 09/04/16 0233 09/04/16 0821  WBC 6.2 7.0  RBC 3.37* 3.43*  HGB 9.6* 9.9*  HCT 30.3* 30.9*  MCV  89.9 90.1  MCH 28.5 28.9  MCHC 31.7 32.0  RDW 14.8 14.6  PLT 233 241    Cardiac Enzymes  Recent Labs Lab 09/04/16 0821 09/04/16 1535 09/04/16 2018  TROPONINI 0.20* 0.17* 0.20*     Recent Labs Lab 09/04/16 0248  TROPIPOC 0.00     BNPNo results for input(s): BNP, PROBNP in the last 168 hours.   DDimer No results for input(s): DDIMER in the last 168 hours.   Radiology    No results found.  Cardiac Studies   Echo personally reviewed EF 50-55% no discrete RWMAls estimated PA 36 mmHg no valve disease   Patient Profile     66 y.o. female with hypertensive urgency positive troponin , abnormal ECG and chest pain with dyspnea  Assessment & Plan    1) Elevated troponin : no pain echo ok for myovue today  2. HTN:  ? Angioedema with lisinopril Edema with norvasc  Continue beta blocker and bidil  3) DM:  Discussed low carb diet.  Target hemoglobin A1c is 6.5 or less.  Continue current medications.]  Signed, Jenkins Rouge, MD  09/06/2016, 11:02 AM

## 2016-09-06 NOTE — Progress Notes (Signed)
   Desiree Sanchez presented for a nuclear stress test today.  No immediate complications.  Stress imaging is pending at this time.  Preliminary EKG findings may be listed in the chart, but the stress test result will not be finalized until perfusion imaging is complete.  Charlie Pitter, PA-C 09/06/2016, 12:24 PM

## 2016-09-06 NOTE — Progress Notes (Addendum)
Patient ID: Desiree Sanchez, female   DOB: 23-Feb-1951, 66 y.o.   MRN: [id number]    PROGRESS NOTE  Desiree Sanchez  LGX:[id number] DOB: [birth year] DOA: 09/04/2016  PCP: Minerva Ends, MD   Brief Narrative: 66 y.o. female presented for evaluation of several days duration of intermittent chest pressure, worse with exertion and radiating to left neck, associated with diaphoresis. NTG relieved pain. Cardiology consulted.   Assessment & Plan:  Chest pain - Concerning for unstable angina. - ? demand ischemia related to hypertensive heart disease with severely elevated BP and anemia vs CAD - ECHO done, EF 55%, Myoview done today, cardiology team wants to proceed with cardiac cath - plan to keep NPO after midnight  - appreciate cardiology team assistance   Systolic murmur - evaluation per cardiology   Uncontrolled hypertension - continue Coreg, Isosorbide-Hydralazine  - so far reasonable inpatient control   Hypokalemia - supplemented, WNL this AM  DM type II - on metformin at home - A1C pend - SSI while inpatient   Acute kidney injury, pre renal in etiology  - Cr trending down - BMP in AM  Anemia, ? IDA - Denies melena or active bleeding - anemia panel with next blood work  DVT prophylaxis: heparin SQ Code Status: full Family Communication: Patient at bedside  Disposition Plan: home when cardio team clears   Consultants:   Cardiology   Procedures:   Myoview 4/1 -->  Cardiac cath 4/2 -->  Antimicrobials:   None  Subjective: No events overnight, pt denies chest pain.  Objective: Vitals:   09/06/16 1227 09/06/16 1228 09/06/16 1437 09/06/16 1438  BP: (!) 135/51 (!) 121/48  127/64  Pulse:      Resp:    15  Temp:   97.9 F (36.6 C)   TempSrc:   Oral   SpO2:      Weight:      Height:       No intake or output data in the 24 hours ending 09/06/16 1527 Filed Weights   09/04/16 0223  Weight: 114.8 kg (253 lb)    Examination:  General exam:  Appears calm and comfortable  Respiratory system: Clear to auscultation. Respiratory effort normal. Cardiovascular system: S1 & S2 heard, RRR. No JVD, rubs, gallops or clicks. No pedal edema. Gastrointestinal system: Abdomen is nondistended, soft and nontender. No organomegaly or masses felt. Normal bowel sounds heard. Central nervous system: Alert and oriented. No focal neurological deficits. Extremities: Symmetric 5 x 5 power. Skin: No rashes, lesions or ulcers Psychiatry: Judgement and insight appear normal. Mood & affect appropriate.    Data Reviewed: I have personally reviewed following labs and imaging studies  CBC:  Recent Labs Lab 09/04/16 0233 09/04/16 0821  WBC 6.2 7.0  HGB 9.6* 9.9*  HCT 30.3* 30.9*  MCV 89.9 90.1  PLT 233 814   Basic Metabolic Panel:  Recent Labs Lab 09/04/16 0233 09/04/16 0821 09/05/16 0228  NA 144  --  142  K 3.3*  --  3.7  CL 106  --  109  CO2 26  --  24  GLUCOSE 146*  --  114*  BUN 16  --  14  CREATININE 1.13* 1.08* 1.05*  CALCIUM 9.2  --  9.2  MG  --  2.0  --    Cardiac Enzymes:  Recent Labs Lab 09/04/16 0821 09/04/16 1535 09/04/16 2018  TROPONINI 0.20* 0.17* 0.20*   CBG:  Recent Labs Lab 09/05/16 1322 09/05/16 1714 09/05/16 2047 09/06/16 4818  09/06/16 1420  GLUCAP 106* 130* 110* 101* 148*   Lipid Profile:  Recent Labs  09/05/16 0228  CHOL 108  HDL 31*  LDLCALC 55  TRIG 111  CHOLHDL 3.5    Recent Results (from the past 240 hour(s))  MRSA PCR Screening     Status: None   Collection Time: 09/04/16 12:20 PM  Result Value Ref Range Status   MRSA by PCR NEGATIVE NEGATIVE Final    Comment:        The GeneXpert MRSA Assay (FDA approved for NASAL specimens only), is one component of a comprehensive MRSA colonization surveillance program. It is not intended to diagnose MRSA infection nor to guide or monitor treatment for MRSA infections.     Radiology Studies: Nm Myocar Multi W/spect W/wall Motion /  Ef  Result Date: 09/06/2016  There was no ST segment deviation noted during stress.  T wave inversion was noted during stress in the V3, V4, V5 and V6 leads.  Findings consistent with ischemia and prior myocardial infarction.  This is a low risk study.  The left ventricular ejection fraction is normal (55-65%).  Poor quality study with extracardiac activity and diaphragmatic attenuation Cannot r/o inferior MI vs diaphragmatic attenuation small area of apical ischemia EF 57% with apical hypokinesis   Scheduled Meds: . regadenoson      . aspirin EC  325 mg Oral Daily  . carvedilol  12.5 mg Oral BID WC  . heparin  5,000 Units Subcutaneous Q8H  . insulin aspart  0-15 Units Subcutaneous TID WC  . isosorbide-hydrALAZINE  1 tablet Oral TID   Continuous Infusions:   LOS: 0 days   Time spent: 20 minutes   Faye Ramsay, MD [facility] Pager [phone]  If 7PM-7AM, please contact night-coverage [email or web address] Password [facility] 09/06/2016, 3:27 PM

## 2016-09-07 ENCOUNTER — Ambulatory Visit (HOSPITAL_COMMUNITY)
Admission: EM | Disposition: A | Discharge status: Home / Self Care | Payer: Self-pay | Source: Home / Self Care | Attending: Emergency Medicine

## 2016-09-07 DIAGNOSIS — R011 Cardiac murmur, unspecified: Secondary | ICD-10-CM | POA: Diagnosis not present

## 2016-09-07 DIAGNOSIS — I200 Unstable angina: Secondary | ICD-10-CM | POA: Diagnosis not present

## 2016-09-07 DIAGNOSIS — D649 Anemia, unspecified: Secondary | ICD-10-CM | POA: Diagnosis not present

## 2016-09-07 DIAGNOSIS — I25110 Atherosclerotic heart disease of native coronary artery with unstable angina pectoris: Secondary | ICD-10-CM

## 2016-09-07 DIAGNOSIS — E119 Type 2 diabetes mellitus without complications: Secondary | ICD-10-CM | POA: Diagnosis not present

## 2016-09-07 DIAGNOSIS — I10 Essential (primary) hypertension: Secondary | ICD-10-CM

## 2016-09-07 DIAGNOSIS — I119 Hypertensive heart disease without heart failure: Secondary | ICD-10-CM | POA: Diagnosis not present

## 2016-09-07 DIAGNOSIS — E876 Hypokalemia: Secondary | ICD-10-CM | POA: Diagnosis not present

## 2016-09-07 DIAGNOSIS — I252 Old myocardial infarction: Secondary | ICD-10-CM | POA: Diagnosis not present

## 2016-09-07 DIAGNOSIS — R7989 Other specified abnormal findings of blood chemistry: Secondary | ICD-10-CM | POA: Diagnosis not present

## 2016-09-07 DIAGNOSIS — R079 Chest pain, unspecified: Secondary | ICD-10-CM | POA: Diagnosis not present

## 2016-09-07 DIAGNOSIS — N179 Acute kidney failure, unspecified: Secondary | ICD-10-CM | POA: Diagnosis not present

## 2016-09-07 HISTORY — PX: LEFT HEART CATH AND CORONARY ANGIOGRAPHY: CATH118249

## 2016-09-07 HISTORY — PX: CORONARY PRESSURE/FFR STUDY: CATH118243

## 2016-09-07 LAB — CBC
HCT: 28.9 % — ABNORMAL LOW (ref 36.0–46.0)
Hemoglobin: 9.3 g/dL — ABNORMAL LOW (ref 12.0–15.0)
MCH: 28.7 pg (ref 26.0–34.0)
MCHC: 32.2 g/dL (ref 30.0–36.0)
MCV: 89.2 fL (ref 78.0–100.0)
PLATELETS: 229 10*3/uL (ref 150–400)
RBC: 3.24 MIL/uL — ABNORMAL LOW (ref 3.87–5.11)
RDW: 14.3 % (ref 11.5–15.5)
WBC: 5 10*3/uL (ref 4.0–10.5)

## 2016-09-07 LAB — BASIC METABOLIC PANEL
Anion gap: 10 (ref 5–15)
BUN: 21 mg/dL — AB (ref 6–20)
CO2: 22 mmol/L (ref 22–32)
CREATININE: 1.14 mg/dL — AB (ref 0.44–1.00)
Calcium: 9.1 mg/dL (ref 8.9–10.3)
Chloride: 108 mmol/L (ref 101–111)
GFR calc Af Amer: 56 mL/min — ABNORMAL LOW (ref >60)
GFR calc non Af Amer: 48 mL/min — ABNORMAL LOW (ref >60)
Glucose, Bld: 107 mg/dL — ABNORMAL HIGH (ref 65–99)
Potassium: 3.5 mmol/L (ref 3.5–5.1)
Sodium: 140 mmol/L (ref 135–145)

## 2016-09-07 LAB — GLUCOSE, CAPILLARY
GLUCOSE-CAPILLARY: 120 mg/dL — AB (ref 65–99)
GLUCOSE-CAPILLARY: 79 mg/dL (ref 65–99)
GLUCOSE-CAPILLARY: 82 mg/dL (ref 65–99)
Glucose-Capillary: 128 mg/dL — ABNORMAL HIGH (ref 65–99)

## 2016-09-07 LAB — POCT ACTIVATED CLOTTING TIME: Activated Clotting Time: 235 seconds

## 2016-09-07 LAB — PROTIME-INR
INR: 1.13
Prothrombin Time: 14.5 seconds (ref 11.4–15.2)

## 2016-09-07 SURGERY — LEFT HEART CATH AND CORONARY ANGIOGRAPHY
Anesthesia: LOCAL

## 2016-09-07 MED ORDER — SODIUM CHLORIDE 0.9 % WEIGHT BASED INFUSION
1.0000 mL/kg/h | INTRAVENOUS | Status: DC
  Administered 2016-09-07: 1 mL/kg/h via INTRAVENOUS
  Administered 2016-09-07: 8.702 mL/kg/h via INTRAVENOUS

## 2016-09-07 MED ORDER — TRAMADOL HCL 50 MG PO TABS
50.0000 mg | ORAL_TABLET | Freq: Once | ORAL | Status: AC
  Administered 2016-09-07: 23:00:00 50 mg via ORAL
  Filled 2016-09-07: qty 1

## 2016-09-07 MED ORDER — LIDOCAINE HCL (PF) 1 % IJ SOLN
INTRAMUSCULAR | Status: DC | PRN
  Administered 2016-09-07: 2 mL

## 2016-09-07 MED ORDER — SODIUM CHLORIDE 0.9 % IV SOLN: 250.0000 mL | INTRAVENOUS | Status: DC | PRN

## 2016-09-07 MED ORDER — SODIUM CHLORIDE 0.9 % IV SOLN
INTRAVENOUS | Status: AC
  Administered 2016-09-07: 500 mL via INTRAVENOUS

## 2016-09-07 MED ORDER — ADENOSINE (DIAGNOSTIC) 140MCG/KG/MIN
INTRAVENOUS | Status: DC | PRN
  Administered 2016-09-07: 140 ug/kg/min via INTRAVENOUS

## 2016-09-07 MED ORDER — ASPIRIN 81 MG PO CHEW
81.0000 mg | CHEWABLE_TABLET | ORAL | Status: AC
  Administered 2016-09-07: 81 mg via ORAL
  Filled 2016-09-07: qty 1

## 2016-09-07 MED ORDER — FENTANYL CITRATE (PF) 100 MCG/2ML IJ SOLN
INTRAMUSCULAR | Status: DC | PRN
  Administered 2016-09-07: 50 ug via INTRAVENOUS

## 2016-09-07 MED ORDER — MIDAZOLAM HCL 2 MG/2ML IJ SOLN
INTRAMUSCULAR | Status: DC | PRN
  Administered 2016-09-07: 1 mg via INTRAVENOUS

## 2016-09-07 MED ORDER — LIDOCAINE HCL (PF) 1 % IJ SOLN
INTRAMUSCULAR | Status: AC
  Filled 2016-09-07: qty 30

## 2016-09-07 MED ORDER — HEPARIN (PORCINE) IN NACL 2-0.9 UNIT/ML-% IJ SOLN
INTRAMUSCULAR | Status: AC
  Filled 2016-09-07: qty 1000

## 2016-09-07 MED ORDER — SODIUM CHLORIDE 0.9% FLUSH: 3.0000 mL | INTRAVENOUS | Status: DC | PRN

## 2016-09-07 MED ORDER — NITROGLYCERIN 0.4 MG SL SUBL
SUBLINGUAL_TABLET | SUBLINGUAL | Status: AC
  Filled 2016-09-07: qty 1

## 2016-09-07 MED ORDER — HEPARIN SODIUM (PORCINE) 1000 UNIT/ML IJ SOLN
INTRAMUSCULAR | Status: DC | PRN
Start: 2016-09-07 — End: 2016-09-07
  Administered 2016-09-07 (×2): 5000 [IU] via INTRAVENOUS
  Administered 2016-09-07: 3000 [IU] via INTRAVENOUS

## 2016-09-07 MED ORDER — SODIUM CHLORIDE 0.9% FLUSH
3.0000 mL | Freq: Two times a day (BID) | INTRAVENOUS | Status: DC
  Administered 2016-09-07 – 2016-09-08 (×2): 3 mL via INTRAVENOUS

## 2016-09-07 MED ORDER — CARVEDILOL 12.5 MG PO TABS
25.0000 mg | ORAL_TABLET | Freq: Two times a day (BID) | ORAL | Status: DC
  Administered 2016-09-08: 10:00:00 25 mg via ORAL
  Filled 2016-09-07: qty 2

## 2016-09-07 MED ORDER — NITROGLYCERIN 0.4 MG/SPRAY TL SOLN
Status: DC | PRN
  Administered 2016-09-07: 1 via SUBLINGUAL

## 2016-09-07 MED ORDER — SODIUM CHLORIDE 0.9 % WEIGHT BASED INFUSION
3.0000 mL/kg/h | INTRAVENOUS | Status: DC
  Administered 2016-09-07: 3 mL/kg/h via INTRAVENOUS

## 2016-09-07 MED ORDER — HEPARIN SODIUM (PORCINE) 1000 UNIT/ML IJ SOLN
INTRAMUSCULAR | Status: AC
  Filled 2016-09-07: qty 1

## 2016-09-07 MED ORDER — VERAPAMIL HCL 2.5 MG/ML IV SOLN
INTRAVENOUS | Status: AC
  Filled 2016-09-07: qty 2

## 2016-09-07 MED ORDER — IOPAMIDOL (ISOVUE-370) INJECTION 76%
INTRAVENOUS | Status: AC
  Filled 2016-09-07: qty 100

## 2016-09-07 MED ORDER — IOPAMIDOL (ISOVUE-370) INJECTION 76%
INTRAVENOUS | Status: DC | PRN
  Administered 2016-09-07: 75 mL via INTRA_ARTERIAL

## 2016-09-07 MED ORDER — MIDAZOLAM HCL 2 MG/2ML IJ SOLN
INTRAMUSCULAR | Status: AC
Start: 2016-09-07 — End: 2016-09-07
  Filled 2016-09-07: qty 2

## 2016-09-07 MED ORDER — ADENOSINE 12 MG/4ML IV SOLN
INTRAVENOUS | Status: AC
  Filled 2016-09-07: qty 16

## 2016-09-07 MED ORDER — SODIUM CHLORIDE 0.9% FLUSH
3.0000 mL | Freq: Two times a day (BID) | INTRAVENOUS | Status: DC
  Administered 2016-09-07 (×2): 3 mL via INTRAVENOUS

## 2016-09-07 MED ORDER — HEPARIN (PORCINE) IN NACL 2-0.9 UNIT/ML-% IJ SOLN
INTRAMUSCULAR | Status: DC | PRN
  Administered 2016-09-07: 1000 mL

## 2016-09-07 MED ORDER — VERAPAMIL HCL 2.5 MG/ML IV SOLN
INTRAVENOUS | Status: DC | PRN
  Administered 2016-09-07: 8 mL via INTRA_ARTERIAL

## 2016-09-07 MED ORDER — FENTANYL CITRATE (PF) 100 MCG/2ML IJ SOLN
INTRAMUSCULAR | Status: AC
  Filled 2016-09-07: qty 2

## 2016-09-07 SURGICAL SUPPLY — 12 items
CATH HEARTRAIL IKARI 6F IR1.0 (CATHETERS) ×2 IMPLANT
CATH IMPULSE 5F ANG/FL3.5 (CATHETERS) ×2 IMPLANT
DEVICE RAD COMP TR BAND LRG (VASCULAR PRODUCTS) ×2 IMPLANT
GLIDESHEATH SLEND SS 6F .021 (SHEATH) ×2 IMPLANT
GUIDEWIRE INQWIRE 1.5J.035X260 (WIRE) ×1 IMPLANT
GUIDEWIRE PRESSURE COMET II (WIRE) ×2 IMPLANT
INQWIRE 1.5J .035X260CM (WIRE) ×2
KIT ESSENTIALS PG (KITS) ×2 IMPLANT
KIT HEART LEFT (KITS) ×2 IMPLANT
PACK CARDIAC CATHETERIZATION (CUSTOM PROCEDURE TRAY) ×2 IMPLANT
TRANSDUCER W/STOPCOCK (MISCELLANEOUS) ×2 IMPLANT
TUBING CIL FLEX 10 FLL-RA (TUBING) ×2 IMPLANT

## 2016-09-07 NOTE — H&P (View-Only) (Signed)
Progress Note  Patient Name: Desiree Sanchez Date of Encounter: 09/07/2016  Primary Cardiologist:   Subjective   No CP  NO SOB    Inpatient Medications    Scheduled Meds: . aspirin EC  325 mg Oral Daily  . carvedilol  12.5 mg Oral BID WC  . heparin  5,000 Units Subcutaneous Q8H  . insulin aspart  0-15 Units Subcutaneous TID WC  . isosorbide-hydrALAZINE  1 tablet Oral TID  . sodium chloride flush  3 mL Intravenous Q12H   Continuous Infusions: . sodium chloride 1 mL/kg/hr (09/07/16 0726)   PRN Meds: sodium chloride, acetaminophen, colchicine, hydrALAZINE, morphine injection, nitroGLYCERIN, ondansetron (ZOFRAN) IV, simethicone, sodium chloride flush, zolpidem   Vital Signs    Vitals:   09/06/16 2340 09/07/16 0206 09/07/16 0414 09/07/16 0737  BP: (!) 116/57  (!) 132/59   Pulse: 78  70   Resp: 15  (!) 26   Temp: 97.7 F (36.5 C)  98 F (36.7 C) 98.1 F (36.7 C)  TempSrc: Oral  Oral Oral  SpO2: 98%  98%   Weight:  229 lb 15 oz (104.3 kg)    Height:       No intake or output data in the 24 hours ending 09/07/16 0821 Filed Weights   09/04/16 0223 09/07/16 0206  Weight: 253 lb (114.8 kg) 229 lb 15 oz (104.3 kg)    Telemetry    SR   - Personally Reviewed  ECG      Physical Exam  Pt comfortable at rest   GEN: No acute distress.   Neck: No JVD Cardiac: RRR, no murmurs, rubs, or gallops.  Respiratory: Clear to auscultation bilaterally. GI: Soft, nontender, non-distended  MS: No edema; No deformity. Neuro:  Nonfocal  Psych: Normal affect   Labs    Chemistry Recent Labs Lab 09/04/16 0233 09/04/16 0821 09/05/16 0228 09/07/16 0244  NA 144  --  142 140  K 3.3*  --  3.7 3.5  CL 106  --  109 108  CO2 26  --  24 22  GLUCOSE 146*  --  114* 107*  BUN 16  --  14 21*  CREATININE 1.13* 1.08* 1.05* 1.14*  CALCIUM 9.2  --  9.2 9.1  GFRNONAA 49* 52* 53* 48*  GFRAA 57* 60* >60 56*  ANIONGAP 12  --  9 10     Hematology Recent Labs Lab 09/04/16 0233  09/04/16 0821 09/07/16 0244  WBC 6.2 7.0 5.0  RBC 3.37* 3.43* 3.24*  HGB 9.6* 9.9* 9.3*  HCT 30.3* 30.9* 28.9*  MCV 89.9 90.1 89.2  MCH 28.5 28.9 28.7  MCHC 31.7 32.0 32.2  RDW 14.8 14.6 14.3  PLT 233 241 229    Cardiac Enzymes Recent Labs Lab 09/04/16 0821 09/04/16 1535 09/04/16 2018  TROPONINI 0.20* 0.17* 0.20*    Recent Labs Lab 09/04/16 0248  TROPIPOC 0.00     BNPNo results for input(s): BNP, PROBNP in the last 168 hours.   DDimer No results for input(s): DDIMER in the last 168 hours.   Radiology    Nm Myocar Multi W/spect W/wall Motion / Ef  Result Date: 09/06/2016  There was no ST segment deviation noted during stress.  T wave inversion was noted during stress in the V3, V4, V5 and V6 leads.  Findings consistent with ischemia and prior myocardial infarction.  This is a low risk study.  The left ventricular ejection fraction is normal (55-65%).  Poor quality study with extracardiac activity and  diaphragmatic attenuation Cannot r/o inferior MI vs diaphragmatic attenuation small area of apical ischemia EF 57% with apical hypokinesis    Cardiac Studies   Cath pending    Patient Profile       Assessment & Plan    1  CP  Elevated troponin Nuclear scan yesterday with evid of scar and prior MI  LVEF 55 to 65%  T wave inversion during study  Plan for L heart caht today    2  HTN  BP is OK  Continue meds    3  DM   Watch diet   Signed, Dorris Carnes, MD  09/07/2016, 8:21 AM

## 2016-09-07 NOTE — Progress Notes (Signed)
Progress Note  Patient Name: Desiree Sanchez Date of Encounter: 09/07/2016  Primary Cardiologist:   Subjective   No CP  NO SOB    Inpatient Medications    Scheduled Meds: . aspirin EC  325 mg Oral Daily  . carvedilol  12.5 mg Oral BID WC  . heparin  5,000 Units Subcutaneous Q8H  . insulin aspart  0-15 Units Subcutaneous TID WC  . isosorbide-hydrALAZINE  1 tablet Oral TID  . sodium chloride flush  3 mL Intravenous Q12H   Continuous Infusions: . sodium chloride 1 mL/kg/hr (09/07/16 0726)   PRN Meds: sodium chloride, acetaminophen, colchicine, hydrALAZINE, morphine injection, nitroGLYCERIN, ondansetron (ZOFRAN) IV, simethicone, sodium chloride flush, zolpidem   Vital Signs    Vitals:   09/06/16 2340 09/07/16 0206 09/07/16 0414 09/07/16 0737  BP: (!) 116/57  (!) 132/59   Pulse: 78  70   Resp: 15  (!) 26   Temp: 97.7 F (36.5 C)  98 F (36.7 C) 98.1 F (36.7 C)  TempSrc: Oral  Oral Oral  SpO2: 98%  98%   Weight:  229 lb 15 oz (104.3 kg)    Height:       No intake or output data in the 24 hours ending 09/07/16 0821 Filed Weights   09/04/16 0223 09/07/16 0206  Weight: 253 lb (114.8 kg) 229 lb 15 oz (104.3 kg)    Telemetry    SR   - Personally Reviewed  ECG      Physical Exam  Pt comfortable at rest   GEN: No acute distress.   Neck: No JVD Cardiac: RRR, no murmurs, rubs, or gallops.  Respiratory: Clear to auscultation bilaterally. GI: Soft, nontender, non-distended  MS: No edema; No deformity. Neuro:  Nonfocal  Psych: Normal affect   Labs    Chemistry Recent Labs Lab 09/04/16 0233 09/04/16 0821 09/05/16 0228 09/07/16 0244  NA 144  --  142 140  K 3.3*  --  3.7 3.5  CL 106  --  109 108  CO2 26  --  24 22  GLUCOSE 146*  --  114* 107*  BUN 16  --  14 21*  CREATININE 1.13* 1.08* 1.05* 1.14*  CALCIUM 9.2  --  9.2 9.1  GFRNONAA 49* 52* 53* 48*  GFRAA 57* 60* >60 56*  ANIONGAP 12  --  9 10     Hematology Recent Labs Lab 09/04/16 0233  09/04/16 0821 09/07/16 0244  WBC 6.2 7.0 5.0  RBC 3.37* 3.43* 3.24*  HGB 9.6* 9.9* 9.3*  HCT 30.3* 30.9* 28.9*  MCV 89.9 90.1 89.2  MCH 28.5 28.9 28.7  MCHC 31.7 32.0 32.2  RDW 14.8 14.6 14.3  PLT 233 241 229    Cardiac Enzymes Recent Labs Lab 09/04/16 0821 09/04/16 1535 09/04/16 2018  TROPONINI 0.20* 0.17* 0.20*    Recent Labs Lab 09/04/16 0248  TROPIPOC 0.00     BNPNo results for input(s): BNP, PROBNP in the last 168 hours.   DDimer No results for input(s): DDIMER in the last 168 hours.   Radiology    Nm Myocar Multi W/spect W/wall Motion / Ef  Result Date: 09/06/2016  There was no ST segment deviation noted during stress.  T wave inversion was noted during stress in the V3, V4, V5 and V6 leads.  Findings consistent with ischemia and prior myocardial infarction.  This is a low risk study.  The left ventricular ejection fraction is normal (55-65%).  Poor quality study with extracardiac activity and  diaphragmatic attenuation Cannot r/o inferior MI vs diaphragmatic attenuation small area of apical ischemia EF 57% with apical hypokinesis    Cardiac Studies   Cath pending    Patient Profile       Assessment & Plan    1  CP  Elevated troponin Nuclear scan yesterday with evid of scar and prior MI  LVEF 55 to 65%  T wave inversion during study  Plan for L heart caht today    2  HTN  BP is OK  Continue meds    3  DM   Watch diet   Signed, Dorris Carnes, MD  09/07/2016, 8:21 AM

## 2016-09-07 NOTE — Progress Notes (Signed)
Patient ID: Desiree Sanchez, female   DOB: 08-06-1950, 66 y.o.   MRN: [id number]    PROGRESS NOTE  Desiree Sanchez  VCB:[id number] DOB: [birth year] DOA: 09/04/2016  PCP: Minerva Ends, MD   Brief Narrative: 66 y.o. female presented for evaluation of several days duration of intermittent chest pressure, worse with exertion and radiating to left neck, associated with diaphoresis. NTG relieved pain. Cardiology consulted.   Assessment & Plan:  Chest pain - Concerning for unstable angina. - ? demand ischemia related to hypertensive heart disease with severely elevated BP and anemia vs CAD - ECHO done, EF 55%, Myoview done today, cardiology team wants to proceed with cardiac cath - plan for L heart cath today, keep NPO for now  - appreciate cardiology team assistance   Systolic murmur - evaluation per cardiology   Uncontrolled hypertension - continue Coreg, Isosorbide-Hydralazine  - SBP in 170's this AM, added hydralazine as needed   Hypokalemia - supplemented, WNL this AM  DM type II - on metformin at home - A1C 6.3 (09/05/2016) - SSI while inpatient   Acute kidney injury, pre renal in etiology  - Cr slightly up this AM, pt says she did not eat and drink much yesterday  - now NPO for planned cath, resume diet post cath - BMP in AM if remains inpatient   Anemia, ? IDA - Denies melena or active bleeding - anemia panel pending - CBC remains stable ~9  DVT prophylaxis: heparin SQ Code Status: full Family Communication: Patient at bedside  Disposition Plan: home when cardio team clears, possibly after cath but will have to see what cardio team recommends   Consultants:   Cardiology   Procedures:   Myoview 4/1 -->  Cardiac cath 4/2 -->  Antimicrobials:   None  Subjective: No events overnight, pt denies chest pain.  Objective: Vitals:   09/07/16 0735 09/07/16 0737 09/07/16 0800 09/07/16 0900  BP: (!) 175/73     Pulse:      Resp:      Temp:  98.1 F (36.7  C)    TempSrc:  Oral    SpO2:   97% 96%  Weight:      Height:        Intake/Output Summary (Last 24 hours) at 09/07/16 1105 Last data filed at 09/07/16 0900  Gross per 24 hour  Intake           499.85 ml  Output                0 ml  Net           499.85 ml   Filed Weights   09/04/16 0223 09/07/16 0206  Weight: 114.8 kg (253 lb) 104.3 kg (229 lb 15 oz)    Examination:  General exam: Appears calm and comfortable  Respiratory system: Clear to auscultation. Respiratory effort normal. Cardiovascular system: S1 & S2 heard, RRR. No JVD, rubs, gallops or clicks. No pedal edema. Gastrointestinal system: Abdomen is nondistended, soft and nontender. No organomegaly or masses felt.  Central nervous system: Alert and oriented. No focal neurological deficits.  Data Reviewed: I have personally reviewed following labs and imaging studies  CBC:  Recent Labs Lab 09/04/16 0233 09/04/16 0821 09/07/16 0244  WBC 6.2 7.0 5.0  HGB 9.6* 9.9* 9.3*  HCT 30.3* 30.9* 28.9*  MCV 89.9 90.1 89.2  PLT 233 241 384   Basic Metabolic Panel:  Recent Labs Lab 09/04/16 0233 09/04/16 6659 09/05/16 0228 09/07/16 0244  NA 144  --  142 140  K 3.3*  --  3.7 3.5  CL 106  --  109 108  CO2 26  --  24 22  GLUCOSE 146*  --  114* 107*  BUN 16  --  14 21*  CREATININE 1.13* 1.08* 1.05* 1.14*  CALCIUM 9.2  --  9.2 9.1  MG  --  2.0  --   --    Cardiac Enzymes:  Recent Labs Lab 09/04/16 0821 09/04/16 1535 09/04/16 2018  TROPONINI 0.20* 0.17* 0.20*   CBG:  Recent Labs Lab 09/06/16 0843 09/06/16 1420 09/06/16 1802 09/06/16 2113 09/07/16 0735  GLUCAP 101* 148* 105* 152* 120*   Lipid Profile:  Recent Labs  09/05/16 0228  CHOL 108  HDL 31*  LDLCALC 55  TRIG 111  CHOLHDL 3.5    Recent Results (from the past 240 hour(s))  MRSA PCR Screening     Status: None   Collection Time: 09/04/16 12:20 PM  Result Value Ref Range Status   MRSA by PCR NEGATIVE NEGATIVE Final    Comment:          The GeneXpert MRSA Assay (FDA approved for NASAL specimens only), is one component of a comprehensive MRSA colonization surveillance program. It is not intended to diagnose MRSA infection nor to guide or monitor treatment for MRSA infections.     Radiology Studies: Nm Myocar Multi W/spect W/wall Motion / Ef  Result Date: 09/06/2016  There was no ST segment deviation noted during stress.  T wave inversion was noted during stress in the V3, V4, V5 and V6 leads.  Findings consistent with ischemia and prior myocardial infarction.  This is a low risk study.  The left ventricular ejection fraction is normal (55-65%).  Poor quality study with extracardiac activity and diaphragmatic attenuation Cannot r/o inferior MI vs diaphragmatic attenuation small area of apical ischemia EF 57% with apical hypokinesis   Scheduled Meds: . aspirin EC  325 mg Oral Daily  . carvedilol  12.5 mg Oral BID WC  . heparin  5,000 Units Subcutaneous Q8H  . insulin aspart  0-15 Units Subcutaneous TID WC  . isosorbide-hydrALAZINE  1 tablet Oral TID  . sodium chloride flush  3 mL Intravenous Q12H   Continuous Infusions: . sodium chloride 1 mL/kg/hr (09/07/16 0900)     LOS: 0 days   Time spent: 20 minutes   Faye Ramsay, MD [facility] Pager [phone]  If 7PM-7AM, please contact night-coverage [email or web address] Password [email or web address] 09/07/2016, 11:05 AM

## 2016-09-07 NOTE — Interval H&P Note (Signed)
History and Physical Interval Note:  09/07/2016 6:31 PM  Desiree Sanchez  has presented today for cardiac catheterization, with the diagnosis of unstable angina  The various methods of treatment have been discussed with the patient and family. After consideration of risks, benefits and other options for treatment, the patient has consented to  Procedure(s): Left Heart Cath and Coronary Angiography (N/A) as a surgical intervention .  The patient's history has been reviewed, patient examined, no change in status, stable for surgery.  I have reviewed the patient's chart and labs.  Questions were answered to the patient's satisfaction.    Cath Lab Visit (complete for each Cath Lab visit)  Clinical Evaluation Leading to the Procedure:   ACS: Yes.    Non-ACS:    Anginal Classification: CCS IV  Anti-ischemic medical therapy: Minimal Therapy (1 class of medications)  Non-Invasive Test Results: Low-risk stress test findings: cardiac mortality <1%/year  Prior CABG: No previous CABG  Desiree Sanchez

## 2016-09-08 ENCOUNTER — Encounter (HOSPITAL_COMMUNITY): Payer: Self-pay | Admitting: Internal Medicine

## 2016-09-08 ENCOUNTER — Other Ambulatory Visit: Payer: Self-pay

## 2016-09-08 DIAGNOSIS — E876 Hypokalemia: Secondary | ICD-10-CM | POA: Diagnosis not present

## 2016-09-08 DIAGNOSIS — R071 Chest pain on breathing: Secondary | ICD-10-CM

## 2016-09-08 DIAGNOSIS — R7989 Other specified abnormal findings of blood chemistry: Secondary | ICD-10-CM | POA: Diagnosis not present

## 2016-09-08 DIAGNOSIS — D649 Anemia, unspecified: Secondary | ICD-10-CM | POA: Diagnosis not present

## 2016-09-08 DIAGNOSIS — E119 Type 2 diabetes mellitus without complications: Secondary | ICD-10-CM | POA: Diagnosis not present

## 2016-09-08 DIAGNOSIS — R011 Cardiac murmur, unspecified: Secondary | ICD-10-CM | POA: Diagnosis not present

## 2016-09-08 DIAGNOSIS — I200 Unstable angina: Secondary | ICD-10-CM | POA: Diagnosis not present

## 2016-09-08 DIAGNOSIS — I10 Essential (primary) hypertension: Secondary | ICD-10-CM | POA: Diagnosis not present

## 2016-09-08 DIAGNOSIS — R079 Chest pain, unspecified: Secondary | ICD-10-CM | POA: Diagnosis not present

## 2016-09-08 DIAGNOSIS — I252 Old myocardial infarction: Secondary | ICD-10-CM | POA: Diagnosis not present

## 2016-09-08 DIAGNOSIS — I119 Hypertensive heart disease without heart failure: Secondary | ICD-10-CM | POA: Diagnosis not present

## 2016-09-08 DIAGNOSIS — I25110 Atherosclerotic heart disease of native coronary artery with unstable angina pectoris: Secondary | ICD-10-CM | POA: Diagnosis not present

## 2016-09-08 DIAGNOSIS — R072 Precordial pain: Secondary | ICD-10-CM | POA: Diagnosis not present

## 2016-09-08 DIAGNOSIS — N179 Acute kidney failure, unspecified: Secondary | ICD-10-CM | POA: Diagnosis not present

## 2016-09-08 LAB — BASIC METABOLIC PANEL
Anion gap: 10 (ref 5–15)
BUN: 15 mg/dL (ref 6–20)
CHLORIDE: 112 mmol/L — AB (ref 101–111)
CO2: 20 mmol/L — ABNORMAL LOW (ref 22–32)
Calcium: 9 mg/dL (ref 8.9–10.3)
Creatinine, Ser: 0.99 mg/dL (ref 0.44–1.00)
GFR, EST NON AFRICAN AMERICAN: 57 mL/min — AB (ref >60)
Glucose, Bld: 98 mg/dL (ref 65–99)
POTASSIUM: 3.3 mmol/L — AB (ref 3.5–5.1)
SODIUM: 142 mmol/L (ref 135–145)

## 2016-09-08 LAB — CBC
HCT: 27.6 % — ABNORMAL LOW (ref 36.0–46.0)
HEMOGLOBIN: 8.8 g/dL — AB (ref 12.0–15.0)
MCH: 28.3 pg (ref 26.0–34.0)
MCHC: 31.9 g/dL (ref 30.0–36.0)
MCV: 88.7 fL (ref 78.0–100.0)
Platelets: 224 10*3/uL (ref 150–400)
RBC: 3.11 MIL/uL — AB (ref 3.87–5.11)
RDW: 14 % (ref 11.5–15.5)
WBC: 5.4 10*3/uL (ref 4.0–10.5)

## 2016-09-08 LAB — GLUCOSE, CAPILLARY
GLUCOSE-CAPILLARY: 109 mg/dL — AB (ref 65–99)
Glucose-Capillary: 90 mg/dL (ref 65–99)

## 2016-09-08 MED ORDER — CARVEDILOL 25 MG PO TABS: 25.0000 mg | ORAL_TABLET | Freq: Two times a day (BID) | ORAL | 0 refills | Status: DC

## 2016-09-08 MED ORDER — ISOSORB DINITRATE-HYDRALAZINE 20-37.5 MG PO TABS: 1.0000 | ORAL_TABLET | Freq: Three times a day (TID) | ORAL | 0 refills | Status: DC

## 2016-09-08 MED ORDER — POTASSIUM CHLORIDE CRYS ER 20 MEQ PO TBCR
40.0000 meq | EXTENDED_RELEASE_TABLET | Freq: Once | ORAL | Status: AC
  Administered 2016-09-08: 11:00:00 40 meq via ORAL
  Filled 2016-09-08: qty 2

## 2016-09-08 MED ORDER — ATORVASTATIN CALCIUM 10 MG PO TABS
10.0000 mg | ORAL_TABLET | Freq: Every day | ORAL | 0 refills | Status: DC
Start: 2016-09-08 — End: 2016-10-06

## 2016-09-08 MED ORDER — ATORVASTATIN CALCIUM 10 MG PO TABS: 10.0000 mg | ORAL_TABLET | Freq: Every day | ORAL | 0 refills | Status: DC

## 2016-09-08 MED FILL — Nitroglycerin SL Tab 0.4 MG: SUBLINGUAL | Qty: 1 | Status: AC

## 2016-09-08 NOTE — Progress Notes (Signed)
Progress Note  Patient Name: Desiree Sanchez Date of Encounter: 09/08/2016  Primary Cardiologist: Reola Calkins  (Peter [place]ique)  Subjective   Breaithing is OK  NO CP    Inpatient Medications    Scheduled Meds: . carvedilol  25 mg Oral BID WC  . heparin  5,000 Units Subcutaneous Q8H  . insulin aspart  0-15 Units Subcutaneous TID WC  . isosorbide-hydrALAZINE  1 tablet Oral TID  . sodium chloride flush  3 mL Intravenous Q12H   Continuous Infusions:  PRN Meds: sodium chloride, acetaminophen, colchicine, hydrALAZINE, morphine injection, nitroGLYCERIN, ondansetron (ZOFRAN) IV, simethicone, sodium chloride flush, zolpidem   Vital Signs    Vitals:   09/07/16 2326 09/08/16 0322 09/08/16 0635 09/08/16 0700  BP: (!) 157/58 (!) 140/53 (!) 143/45 (!) 143/75  Pulse: 81 79 72 72  Resp: 20 19 (!) 25 (!) 26  Temp:  98.6 F (37 C)  97.6 F (36.4 C)  TempSrc: Oral Oral  Oral  SpO2: 97% 97% 96% 99%  Weight:  236 lb 1.8 oz (107.1 kg)    Height:        Intake/Output Summary (Last 24 hours) at 09/08/16 0753 Last data filed at 09/08/16 0000  Gross per 24 hour  Intake           1967.5 ml  Output              400 ml  Net           1567.5 ml   Filed Weights   09/07/16 0206 09/07/16 1950 09/08/16 0322  Weight: 229 lb 15 oz (104.3 kg) 229 lb 15 oz (104.3 kg) 236 lb 1.8 oz (107.1 kg)    Telemetry    SR - Personally Reviewed  ECG    Physical Exam  NAD   GEN: No acute distress.   Neck: No JVD Cardiac: RRR, no murmurs, rubs, or gallops.  Respiratory: Clear to auscultation bilaterally. GI: Soft, nontender, non-distended  MS: No edema; No deformity. Neuro:  Nonfocal  Psych: Normal affect   Labs    Chemistry Recent Labs Lab 09/05/16 0228 09/07/16 0244 09/08/16 0252  NA 142 140 142  K 3.7 3.5 3.3*  CL 109 108 112*  CO2 24 22 20*  GLUCOSE 114* 107* 98  BUN 14 21* 15  CREATININE 1.05* 1.14* 0.99  CALCIUM 9.2 9.1 9.0  GFRNONAA 53* 48* 57*  GFRAA >60 56* >60  ANIONGAP 9 10 10        Hematology Recent Labs Lab 09/04/16 0821 09/07/16 0244 09/08/16 0252  WBC 7.0 5.0 5.4  RBC 3.43* 3.24* 3.11*  HGB 9.9* 9.3* 8.8*  HCT 30.9* 28.9* 27.6*  MCV 90.1 89.2 88.7  MCH 28.9 28.7 28.3  MCHC 32.0 32.2 31.9  RDW 14.6 14.3 14.0  PLT 241 229 224    Cardiac Enzymes Recent Labs Lab 09/04/16 0821 09/04/16 1535 09/04/16 2018  TROPONINI 0.20* 0.17* 0.20*    Recent Labs Lab 09/04/16 0248  TROPIPOC 0.00     BNPNo results for input(s): BNP, PROBNP in the last 168 hours.   DDimer No results for input(s): DDIMER in the last 168 hours.   Radiology    Nm Myocar Multi W/spect W/wall Motion / Ef  Result Date: 09/06/2016  There was no ST segment deviation noted during stress.  T wave inversion was noted during stress in the V3, V4, V5 and V6 leads.  Findings consistent with ischemia and prior myocardial infarction.  This is a low risk study.  The left ventricular ejection fraction is normal (55-65%).  Poor quality study with extracardiac activity and diaphragmatic attenuation Cannot r/o inferior MI vs diaphragmatic attenuation small area of apical ischemia EF 57% with apical hypokinesis    Cardiac Studies   Cath Conclusions: 1. Non-obstructive coronary artery disease, including 40% distal LAD stenosis, 30% OM1 lesion, and 60% distal RCA stenosis (FFR 0.94). Question degree of myocardial bridging at 40% distal LAD lesion. 2. Normal left ventricular filling pressure. 3. Systemic hypertension.  Recommendation: 1. Medical therapy; will increase carvedilol to 25 mg BID to maximize heart rate and blood pressure control. 2. Risk factor modification and medical therapy to prevent progression of CAD.  Nelva Bush, MD      Patient Profile       Assessment & Plan    1  CAD  Cath as noted above  Will try to optimize medical Rx to prevent progression    2  HTN Medical Rx has been advanced since she has been here  WIll make sure she has f/u in cardiology     3  Lipids  Last LDL was 55   Markedly different than previous  She does not appear to be on a statin  I woud add lipitor 10 mg  F/U as outpt    3  DM  Oral agents    OK to d/c home  WIll make sure she has outpt f/u    Signed, Dorris Carnes, MD  09/08/2016, 7:53 AM

## 2016-09-08 NOTE — Progress Notes (Signed)
Scripts called in to [facility].  Faye Ramsay, MD  [facility] Pager [phone]  If 7PM-7AM, please contact night-coverage [email or web address] Password [email or web address]

## 2016-09-08 NOTE — Care Management Obs Status (Signed)
[employer] [employer] NOTIFICATION   Patient Details  Name: AMERIE BEAUMONT MRN: [id number] Date of Birth: [birth year]   Medicare Observation Status Notification Given:  Yes    Zenon Mayo, RN 09/08/2016, 11:05 AM

## 2016-09-08 NOTE — Discharge Instructions (Signed)
Chest Wall Pain Chest wall pain is pain in or around the bones and muscles of your chest. Sometimes, an injury causes this pain. Sometimes, the cause may not be known. This pain may take several weeks or longer to get better. Follow these instructions at home: Pay attention to any changes in your symptoms. Take these actions to help with your pain:  Rest as told by your doctor.  Avoid activities that cause pain. Try not to use your chest, belly (abdominal), or side muscles to lift heavy things.  If directed, apply ice to the painful area:  Put ice in a plastic bag.  Place a towel between your skin and the bag.  Leave the ice on for 20 minutes, 2-3 times per day.  Take over-the-counter and prescription medicines only as told by your doctor.  Do not use tobacco products, including cigarettes, chewing tobacco, and e-cigarettes. If you need help quitting, ask your doctor.  Keep all follow-up visits as told by your doctor. This is important. Contact a doctor if:  You have a fever.  Your chest pain gets worse.  You have new symptoms. Get help right away if:  You feel sick to your stomach (nauseous) or you throw up (vomit).  You feel sweaty or light-headed.  You have a cough with phlegm (sputum) or you cough up blood.  You are short of breath. This information is not intended to replace advice given to you by your health care provider. Make sure you discuss any questions you have with your health care provider. Document Released: 11/11/2007 Document Revised: 10/31/2015 Document Reviewed: 08/20/2014 Elsevier Interactive Patient Education  2017 [employer].

## 2016-09-08 NOTE — Discharge Summary (Signed)
Physician Discharge Summary  Desiree Sanchez WGY:[id number] DOB: December 14, 1950 DOA: 09/04/2016  PCP: Minerva Ends, MD  Admit date: 09/04/2016 Discharge date: 09/08/2016  Recommendations for Outpatient Follow-up:  1. Pt will need to follow up with PCP in 1-2 weeks post discharge 2. Please obtain BMP to evaluate electrolytes and kidney function, K level  3. Please also check CBC to evaluate Hg and Hct levels 4. Lipitor started   Discharge Diagnoses:  Principal Problem:   Chest pain Active Problems:   DM2 (diabetes mellitus, type 2) (HCC)   Gout   Essential hypertension  Discharge Condition: Stable  Diet recommendation: Heart healthy diet discussed in details   Brief Narrative: 66 y.o.female presented for evaluation of several days duration of intermittent chest pressure, worse with exertion and radiating to left neck, associated with diaphoresis. NTG relieved pain. Cardiology consulted.   Assessment & Plan:  Chest pain - Concerning for unstable angina. - ? demand ischemia related to hypertensive heart disease with severely elevated BP and anemia vs CAD - ECHO done, EF 55%, Myoview done low risk study - pt underwent LHC today, cardiology team has cleared for discharge  - coreg 25 mg PO BID recommended as well as lipitor   Systolic murmur - evaluation per cardiology   Uncontrolled hypertension - continue Coreg, Isosorbide-Hydralazine   Hypokalemia - supplemented prior to discharge   DM type II - on metformin at home - A1C 6.3 (09/05/2016)  Acute kidney injury, pre renal in etiology  - resolved   Anemia, ? IDA - Denies melena or active bleeding  DVT prophylaxis: heparin SQ Code Status: full Family Communication: Patient at bedside  Disposition Plan: home when cardio team clears, possibly after cath but will have to see what cardio team recommends   Consultants:   Cardiology   Procedures:   Myoview 4/1 -->  Cardiac cath 4/2  -->  Antimicrobials:   None  Procedures/Studies: Dg Chest 2 View  Result Date: 09/04/2016 CLINICAL DATA:  Acute onset of left-sided chest pain, radiating to the back. Headache. Diaphoresis. Initial encounter. EXAM: CHEST  2 VIEW COMPARISON:  Chest radiograph performed 04/08/2013 FINDINGS: The lungs are well-aerated. Vascular congestion is noted. Minimal left basilar atelectasis is seen. There is no evidence of pleural effusion or pneumothorax. The heart is borderline enlarged. No acute osseous abnormalities are seen. IMPRESSION: Vascular congestion and borderline cardiomegaly. Minimal left basilar atelectasis noted. Electronically Signed   By: Garald Balding M.D.   On: 09/04/2016 03:02   Nm Myocar Multi W/spect W/wall Motion / Ef  Result Date: 09/06/2016  There was no ST segment deviation noted during stress.  T wave inversion was noted during stress in the V3, V4, V5 and V6 leads.  Findings consistent with ischemia and prior myocardial infarction.  This is a low risk study.  The left ventricular ejection fraction is normal (55-65%).  Poor quality study with extracardiac activity and diaphragmatic attenuation Cannot r/o inferior MI vs diaphragmatic attenuation small area of apical ischemia EF 57% with apical hypokinesis    Discharge Exam: Vitals:   09/08/16 0635 09/08/16 0700  BP: (!) 143/45 (!) 143/75  Pulse: 72 72  Resp: (!) 25 (!) 26  Temp:  97.6 F (36.4 C)   Vitals:   09/07/16 2326 09/08/16 0322 09/08/16 0635 09/08/16 0700  BP: (!) 157/58 (!) 140/53 (!) 143/45 (!) 143/75  Pulse: 81 79 72 72  Resp: 20 19 (!) 25 (!) 26  Temp:  98.6 F (37 C)  97.6 F (  36.4 C)  TempSrc: Oral Oral  Oral  SpO2: 97% 97% 96% 99%  Weight:  107.1 kg (236 lb 1.8 oz)    Height:        General: Pt is alert, follows commands appropriately, not in acute distress Cardiovascular: Regular rate and rhythm, S1/S2 +, no rubs, no gallops Respiratory: Clear to auscultation bilaterally, no wheezing, no  crackles, no rhonchi Abdominal: Soft, non tender, non distended, bowel sounds +, no guarding  Discharge Instructions   Allergies as of 09/08/2016      Reactions   Lisinopril Swelling   REACTION: Swolen lips (only)   Hctz [hydrochlorothiazide] Other (See Comments)   Joint pain    Naproxen Itching, Palpitations   Norvasc [amlodipine Besylate] Other (See Comments)   Peripheral edema       Medication List    STOP taking these medications   ibuprofen 800 MG tablet Commonly known as:  ADVIL,MOTRIN     TAKE these medications   ACCU-CHEK AVIVA PLUS w/Device Kit 1 Device by Does not apply route daily. ICD 10 E11.21   ACCU-CHEK SOFTCLIX LANCETS lancets 1 each by Other route daily. ICD 10 E11.21   atorvastatin 10 MG tablet Commonly known as:  LIPITOR Take 1 tablet (10 mg total) by mouth daily.   carvedilol 25 MG tablet Commonly known as:  COREG Take 1 tablet (25 mg total) by mouth 2 (two) times daily with a meal. What changed:  medication strength  how much to take   colchicine 0.6 MG tablet Take 1 tablet (0.6 mg total) by mouth 2 (two) times daily as needed (gout flare).   gabapentin 100 MG capsule Commonly known as:  NEURONTIN Take 3 capsules (300 mg total) by mouth at bedtime. What changed:  how much to take  when to take this  reasons to take this   glucose blood test strip Commonly known as:  ACCU-CHEK AVIVA PLUS 1 each by Other route daily. ICD 10 E11.21   indomethacin 25 MG capsule Commonly known as:  INDOCIN Take 1 capsule (25 mg total) by mouth 2 (two) times daily with a meal. What changed:  when to take this  reasons to take this   isosorbide-hydrALAZINE 20-37.5 MG tablet Commonly known as:  BIDIL Take 1 tablet by mouth 3 (three) times daily.   metFORMIN 500 MG 24 hr tablet Commonly known as:  GLUCOPHAGE-XR TAKE 2 TABLETS EVERY DAY  AFTER  SUPPER Notes to patient:  HOLD FOR 80 HRS. AFTER PROCEDURE   traMADol 50 MG tablet Commonly known as:   ULTRAM Take 1 tablet (50 mg total) by mouth every 8 (eight) hours as needed. What changed:  reasons to take this   triamcinolone 0.1 % cream : eucerin Crea Apply 1 application topically 2 (two) times daily as needed. Please mix 1:1 What changed:  additional instructions       Follow-up Information    Minerva Ends, MD Follow up.   Specialty:  Family Medicine Contact information: [address] [place] [id number] [phone]            The results of significant diagnostics from this hospitalization (including imaging, microbiology, ancillary and laboratory) are listed below for reference.     Microbiology: Recent Results (from the past 240 hour(s))  MRSA PCR Screening     Status: None   Collection Time: 09/04/16 12:20 PM  Result Value Ref Range Status   MRSA by PCR NEGATIVE NEGATIVE Final    Comment:  The GeneXpert MRSA Assay (FDA approved for NASAL specimens only), is one component of a comprehensive MRSA colonization surveillance program. It is not intended to diagnose MRSA infection nor to guide or monitor treatment for MRSA infections.      Labs: Basic Metabolic Panel:  Recent Labs Lab 09/04/16 0233 09/04/16 0821 09/05/16 0228 09/07/16 0244 09/08/16 0252  NA 144  --  142 140 142  K 3.3*  --  3.7 3.5 3.3*  CL 106  --  109 108 112*  CO2 26  --  24 22 20*  GLUCOSE 146*  --  114* 107* 98  BUN 16  --  14 21* 15  CREATININE 1.13* 1.08* 1.05* 1.14* 0.99  CALCIUM 9.2  --  9.2 9.1 9.0  MG  --  2.0  --   --   --    CBC:  Recent Labs Lab 09/04/16 0233 09/04/16 0821 09/07/16 0244 09/08/16 0252  WBC 6.2 7.0 5.0 5.4  HGB 9.6* 9.9* 9.3* 8.8*  HCT 30.3* 30.9* 28.9* 27.6*  MCV 89.9 90.1 89.2 88.7  PLT 233 241 229 224   Cardiac Enzymes:  Recent Labs Lab 09/04/16 0821 09/04/16 1535 09/04/16 2018  TROPONINI 0.20* 0.17* 0.20*   CBG:  Recent Labs Lab 09/07/16 0735 09/07/16 1131 09/07/16 1605 09/07/16 2009 09/08/16 0632   GLUCAP 120* 128* 79 82 90    SIGNED: Time coordinating discharge: 30 minutes  MAGICK-Hind Chesler, MD  [facility] 09/08/2016, 10:19 AM Pager [phone]  If 7PM-7AM, please contact night-coverage [email or web address] Password [email or web address]

## 2016-09-08 NOTE — Care Management Note (Addendum)
Case Management Note  Patient Details  Name: Desiree Sanchez MRN: [id number] Date of Birth: July 09, 1950  Subjective/Objective:   s/p left heart cath, NCM will cont to follow for dc needs.  Patient's PCP is Dr. Adrian Blackwater at the [facility] clinic, NCM informed MD to send the scripts there, she said she will.  Patient for dc today.                 Action/Plan:   Expected Discharge Date:                  Expected Discharge Plan:  Home/Self Care  In-House Referral:     Discharge planning Services  CM Consult  Post Acute Care Choice:    Choice offered to:     DME Arranged:    DME Agency:     HH Arranged:    HH Agency:     Status of Service:  Completed, signed off  If discussed at [employer] of Stay Meetings, dates discussed:    Additional Comments:  Zenon Mayo, RN 09/08/2016, 9:06 AM

## 2016-09-09 MED FILL — BIDIL TABLET: 20-37.5 | 30 days supply | Qty: 90 | Fill #0

## 2016-09-14 ENCOUNTER — Encounter: Payer: Self-pay | Admitting: Family Medicine

## 2016-09-14 ENCOUNTER — Ambulatory Visit: Payer: Medicare HMO | Attending: Family Medicine | Admitting: Family Medicine

## 2016-09-14 ENCOUNTER — Other Ambulatory Visit: Payer: Self-pay | Admitting: Family Medicine

## 2016-09-14 VITALS — BP 119/76 | HR 74 | Temp 98.0°F | Ht 70.0 in | Wt 225.2 lb

## 2016-09-14 DIAGNOSIS — Z1231 Encounter for screening mammogram for malignant neoplasm of breast: Secondary | ICD-10-CM

## 2016-09-14 DIAGNOSIS — E1121 Type 2 diabetes mellitus with diabetic nephropathy: Secondary | ICD-10-CM | POA: Diagnosis not present

## 2016-09-14 DIAGNOSIS — E2839 Other primary ovarian failure: Secondary | ICD-10-CM | POA: Diagnosis not present

## 2016-09-14 DIAGNOSIS — E876 Hypokalemia: Secondary | ICD-10-CM | POA: Diagnosis not present

## 2016-09-14 DIAGNOSIS — I2510 Atherosclerotic heart disease of native coronary artery without angina pectoris: Secondary | ICD-10-CM | POA: Insufficient documentation

## 2016-09-14 DIAGNOSIS — Z87891 Personal history of nicotine dependence: Secondary | ICD-10-CM | POA: Diagnosis not present

## 2016-09-14 DIAGNOSIS — D649 Anemia, unspecified: Secondary | ICD-10-CM | POA: Diagnosis not present

## 2016-09-14 DIAGNOSIS — K921 Melena: Secondary | ICD-10-CM | POA: Diagnosis not present

## 2016-09-14 DIAGNOSIS — M109 Gout, unspecified: Secondary | ICD-10-CM | POA: Diagnosis not present

## 2016-09-14 DIAGNOSIS — Z7984 Long term (current) use of oral hypoglycemic drugs: Secondary | ICD-10-CM | POA: Diagnosis not present

## 2016-09-14 DIAGNOSIS — I10 Essential (primary) hypertension: Secondary | ICD-10-CM | POA: Diagnosis not present

## 2016-09-14 DIAGNOSIS — D509 Iron deficiency anemia, unspecified: Secondary | ICD-10-CM | POA: Insufficient documentation

## 2016-09-14 LAB — GLUCOSE, POCT (MANUAL RESULT ENTRY): POC Glucose: 120 mg/dl — AB (ref 70–99)

## 2016-09-14 MED ORDER — BLOOD PRESSURE KIT: 1.0000 | PACK | Freq: Every day | 0 refills | Status: DC

## 2016-09-14 MED ORDER — BLOOD PRESSURE KIT: 1.0000 | PACK | Freq: Every day | 0 refills | Status: AC

## 2016-09-14 NOTE — Assessment & Plan Note (Signed)
Repeat potassium and magnesium level

## 2016-09-14 NOTE — Assessment & Plan Note (Signed)
Repeat CBC and iron studies today

## 2016-09-14 NOTE — Assessment & Plan Note (Signed)
Resolved

## 2016-09-14 NOTE — Progress Notes (Signed)
Subjective:  Patient ID: Desiree Sanchez, female    DOB: 12/17/1950  Age: 66 y.o. MRN: [id number]  CC: No chief complaint on file.   HPI Desiree Sanchez has diabetes, HTN, gout she presents for    1. Hospitalization follow up for chest pain: she was hospitalized from 3/30-09/08/2016 for chest pain x 3 days with pain radiating to the back and shoulders that was worse at night. Her chest pain was relieved with NTG. She was found to have HTN. ECHO was done with EF 55%, Myoview done with low risk study. She had left heart catheterization on 09/07/2016: Conclusions: 1. Non-obstructive coronary artery disease, including 40% distal LAD stenosis, 30% OM1 lesion, and 60% distal RCA stenosis (FFR 0.94). Question degree of myocardial bridging at 40% distal LAD lesion. 2. Normal left ventricular filling pressure. 3. Systemic hypertension.  Recommendation: 1. Medical therapy; will increase carvedilol to 25 mg BID to maximize heart rate and blood pressure control. 2. Risk factor modification and medical therapy to prevent progression of CAD.  She is now on bidil and coreg. She denies recurrent chest pains, shortness of breath, leg swelling or nausea.   2. Blood in stool: no recurrent blood in stool since 06/2016. No fatigue, syncope or pre-syncope. She is not taking NSAIDs or anti-platelet agents.   Social History  Substance Use Topics  . Smoking status: Former Research scientist (life sciences)  . Smokeless tobacco: Never Used     Comment: QUIT SMOKING IN 2009  . Alcohol use Yes     Comment: occasionally    Outpatient Medications Prior to Visit  Medication Sig Dispense Refill  . ACCU-CHEK SOFTCLIX LANCETS lancets 1 each by Other route daily. ICD 10 E11.21 100 each 12  . atorvastatin (LIPITOR) 10 MG tablet Take 1 tablet (10 mg total) by mouth daily. 30 tablet 0  . Blood Glucose Monitoring Suppl (ACCU-CHEK AVIVA PLUS) w/Device KIT 1 Device by Does not apply route daily. ICD 10 E11.21 1 kit 0  . carvedilol (COREG) 25 MG  tablet Take 1 tablet (25 mg total) by mouth 2 (two) times daily with a meal. 60 tablet 0  . colchicine 0.6 MG tablet Take 1 tablet (0.6 mg total) by mouth 2 (two) times daily as needed (gout flare). 180 tablet 0  . gabapentin (NEURONTIN) 100 MG capsule Take 3 capsules (300 mg total) by mouth at bedtime. (Patient taking differently: Take 100-300 mg by mouth daily as needed (pain). ) 90 capsule 3  . glucose blood (ACCU-CHEK AVIVA PLUS) test strip 1 each by Other route daily. ICD 10 E11.21 100 each 12  . indomethacin (INDOCIN) 25 MG capsule Take 1 capsule (25 mg total) by mouth 2 (two) times daily with a meal. (Patient taking differently: Take 25 mg by mouth 2 (two) times daily as needed for mild pain. ) 60 capsule 0  . isosorbide-hydrALAZINE (BIDIL) 20-37.5 MG tablet Take 1 tablet by mouth 3 (three) times daily. 90 tablet 0  . metFORMIN (GLUCOPHAGE-XR) 500 MG 24 hr tablet TAKE 2 TABLETS EVERY DAY  AFTER  SUPPER 180 tablet 3  . traMADol (ULTRAM) 50 MG tablet Take 1 tablet (50 mg total) by mouth every 8 (eight) hours as needed. (Patient taking differently: Take 50 mg by mouth every 8 (eight) hours as needed for moderate pain. ) 60 tablet 2  . Triamcinolone Acetonide (TRIAMCINOLONE 0.1 % CREAM : EUCERIN) CREA Apply 1 application topically 2 (two) times daily as needed. Please mix 1:1 (Patient taking differently: Apply 1 application topically  2 (two) times daily as needed. Please mix 1:1) 1 each 3   No facility-administered medications prior to visit.     ROS Review of Systems  Constitutional: Negative for chills and fever.  Eyes: Negative for visual disturbance.  Respiratory: Negative for shortness of breath.   Cardiovascular: Negative for chest pain.  Gastrointestinal: Negative for abdominal distention, abdominal pain, anal bleeding, blood in stool, constipation, diarrhea, nausea, rectal pain and vomiting.  Musculoskeletal: Negative for arthralgias and back pain.  Skin: Negative for rash.    Allergic/Immunologic: Negative for immunocompromised state.  Hematological: Negative for adenopathy. Does not bruise/bleed easily.  Psychiatric/Behavioral: Negative for dysphoric mood and suicidal ideas.    Objective:  BP 119/76   Pulse 74   Temp 98 F (36.7 C) (Oral)   Ht 5' 10"  (1.778 m)   Wt 225 lb 3.2 oz (102.2 kg)   SpO2 99%   BMI 32.31 kg/m   BP/Weight 09/14/2016 09/08/2016 08/29/4008  Systolic BP 272 536 644  Diastolic BP 76 65 82  Wt. (Lbs) 225.2 236.11 238.2  BMI 32.31 33.88 34.18    Physical Exam  Constitutional: She is oriented to person, place, and time. She appears well-developed and well-nourished. No distress.  HENT:  Head: Normocephalic and atraumatic.  Cardiovascular: Normal rate, regular rhythm, normal heart sounds and intact distal pulses.   Pulmonary/Chest: Effort normal and breath sounds normal.  Abdominal: Soft. Bowel sounds are normal. She exhibits no distension and no mass. There is no tenderness. There is no rebound and no guarding.  Genitourinary: Rectal exam shows no external hemorrhoid, no internal hemorrhoid, no fissure, no mass, no tenderness, anal tone normal and guaiac negative stool.  Musculoskeletal: She exhibits no edema.       Hands: Neurological: She is alert and oriented to person, place, and time.  Skin: Skin is warm and dry. No rash noted.  Psychiatric: She has a normal mood and affect.    Lab Results  Component Value Date   HGBA1C 6.3 (H) 09/05/2016   CBG 120  Assessment & Plan:   Diagnoses and all orders for this visit:  Anemia, unspecified type -     CBC -     Iron and TIBC -     Ferritin  Hypokalemia -     BMP8+EGFR -     Magnesium  Type 2 diabetes mellitus with diabetic nephropathy, without long-term current use of insulin (HCC) -     POCT glucose (manual entry) -     Microalbumin/Creatinine Ratio, Urine -     Ambulatory referral to Ophthalmology  Essential hypertension -     Discontinue: Blood Pressure KIT; 1  each by Does not apply route daily. -     Blood Pressure KIT; 1 each by Does not apply route daily.  Estrogen deficiency -     DG Bone Density; Future  Visit for screening mammogram -     MM DIGITAL SCREENING BILATERAL; Future  Blood in stool    No orders of the defined types were placed in this encounter.   Follow-up: Return in about 8 weeks (around 11/09/2016) for HTN .   Boykin Nearing MD

## 2016-09-14 NOTE — Patient Instructions (Addendum)
Diagnoses and all orders for this visit:  Anemia, unspecified type -     CBC -     Iron and TIBC -     Ferritin  Hypokalemia -     BMP8+EGFR -     Magnesium  Type 2 diabetes mellitus with diabetic nephropathy, without long-term current use of insulin (HCC) -     POCT glucose (manual entry) -     Microalbumin/Creatinine Ratio, Urine  Essential hypertension -     Discontinue: Blood Pressure KIT; 1 each by Does not apply route daily. -     Blood Pressure KIT; 1 each by Does not apply route daily.   We will plan to get you on a 81 mg aspirin a day in the future as long as your anemia improves and there is no sign of bleeding Go ahead and start exercise as tolerated I sent an order for BP monitor to [facility] and to [facility]  Return in 8 weeks for HTN   Dr. Funches 

## 2016-09-14 NOTE — Assessment & Plan Note (Signed)
A; well controlled Med: compliant P: Continue current regimen Home BP monitoring

## 2016-09-15 LAB — IRON AND TIBC
IRON SATURATION: 12 % — AB (ref 15–55)
Iron: 40 ug/dL (ref 27–139)
TIBC: 337 ug/dL (ref 250–450)
UIBC: 297 ug/dL (ref 118–369)

## 2016-09-15 LAB — CBC
Hematocrit: 34 % (ref 34.0–46.6)
Hemoglobin: 11 g/dL — ABNORMAL LOW (ref 11.1–15.9)
MCH: 28.4 pg (ref 26.6–33.0)
MCHC: 32.4 g/dL (ref 31.5–35.7)
MCV: 88 fL (ref 79–97)
PLATELETS: 305 10*3/uL (ref 150–379)
RBC: 3.87 x10E6/uL (ref 3.77–5.28)
RDW: 14.3 % (ref 12.3–15.4)
WBC: 7.2 10*3/uL (ref 3.4–10.8)

## 2016-09-15 LAB — MICROALBUMIN / CREATININE URINE RATIO
CREATININE, UR: 250.3 mg/dL
MICROALBUM., U, RANDOM: 19.3 ug/mL
Microalb/Creat Ratio: 7.7 mg/g creat (ref 0.0–30.0)

## 2016-09-15 LAB — BMP8+EGFR
BUN / CREAT RATIO: 19 (ref 12–28)
BUN: 25 mg/dL (ref 8–27)
CALCIUM: 10 mg/dL (ref 8.7–10.3)
CHLORIDE: 104 mmol/L (ref 96–106)
CO2: 17 mmol/L — ABNORMAL LOW (ref 18–29)
Creatinine, Ser: 1.29 mg/dL — ABNORMAL HIGH (ref 0.57–1.00)
GFR calc Af Amer: 49 mL/min/{1.73_m2} — ABNORMAL LOW (ref >59)
GFR calc non Af Amer: 43 mL/min/{1.73_m2} — ABNORMAL LOW (ref >59)
GLUCOSE: 102 mg/dL — AB (ref 65–99)
Potassium: 5.1 mmol/L (ref 3.5–5.2)
Sodium: 139 mmol/L (ref 134–144)

## 2016-09-15 LAB — MAGNESIUM: Magnesium: 1.8 mg/dL (ref 1.6–2.3)

## 2016-09-15 LAB — FERRITIN: Ferritin: 33 ng/mL (ref 15–150)

## 2016-09-22 ENCOUNTER — Ambulatory Visit: Payer: Medicare HMO | Admitting: Sports Medicine

## 2016-09-25 ENCOUNTER — Encounter: Payer: Self-pay | Admitting: Physician Assistant

## 2016-09-25 ENCOUNTER — Ambulatory Visit (INDEPENDENT_AMBULATORY_CARE_PROVIDER_SITE_OTHER): Payer: Medicare HMO | Admitting: Physician Assistant

## 2016-09-25 VITALS — BP 122/70 | HR 76 | Ht 70.0 in | Wt 223.0 lb

## 2016-09-25 DIAGNOSIS — I10 Essential (primary) hypertension: Secondary | ICD-10-CM

## 2016-09-25 DIAGNOSIS — I2510 Atherosclerotic heart disease of native coronary artery without angina pectoris: Secondary | ICD-10-CM | POA: Diagnosis not present

## 2016-09-25 DIAGNOSIS — Z79899 Other long term (current) drug therapy: Secondary | ICD-10-CM

## 2016-09-25 DIAGNOSIS — E119 Type 2 diabetes mellitus without complications: Secondary | ICD-10-CM

## 2016-09-25 MED ORDER — ASPIRIN EC 81 MG PO TBEC: 81.0000 mg | DELAYED_RELEASE_TABLET | Freq: Every day | ORAL | 3 refills | Status: AC

## 2016-09-25 MED ORDER — CARVEDILOL 25 MG PO TABS: 25.0000 mg | ORAL_TABLET | Freq: Two times a day (BID) | ORAL | 1 refills | Status: DC

## 2016-09-25 MED ORDER — CARVEDILOL 25 MG PO TABS: 25.0000 mg | ORAL_TABLET | Freq: Two times a day (BID) | ORAL | 3 refills | Status: DC

## 2016-09-25 NOTE — Patient Instructions (Signed)
Medication Instructions:  START LOW DOSE BABY ASPIRIN 81MG   If you need a refill on your cardiac medications before your next appointment, please call your pharmacy.  Labwork: FLP AND LFT IN 2 MONTHS AT [facility] ON THE 1ST FLOOR  Follow-Up: Your physician wants you to follow-up in: 3-4 MONTHS WITH DR Martinique You will receive a reminder letter in the mail two months in advance. If you don't receive a letter, please call our office June 2018 to schedule the AUGUST 2018 follow-up appointment.   Thank you for choosing [facility] at [facility]!!

## 2016-09-25 NOTE — Progress Notes (Signed)
Cardiology Office Note    Date:  09/26/2016   ID:  Lajoya, Dombek 1951-05-07, MRN [id number]  PCP:  Minerva Ends, MD  Cardiologist:  Dr. Martinique   Chief Complaint  Patient presents with  . Follow-up    seen for Dr. Martinique, recent cath, nonobstructive CAD    History of Present Illness:  Desiree Sanchez is a 66 y.o. female with PMH of DM, HTN, and prior tobacco abuse who presented to the hospital on 09/04/2016 with chest pain. She has a family history of CAD as well with her mother died of MI at age 68. Prior to the hospital, she had 3 day onset of exertional dyspnea. Initial point-of-care troponin was negative, however troponin I was 0.2, unclear if elevation was due to high blood pressure or coronary artery disease. Echocardiogram obtained on 09/05/2016 showed EF 29-47%, grade 1 diastolic dysfunction, PA peak pressure 36 mmHg. Myoview obtained on 09/06/2016 came back low risk study, EF 55-65%, however it cannot rule out inferior MI versus diaphragmatic attenuation with small area of apical ischemia. Patient eventually underwent cardiac catheterization on 09/07/2016, this showed nonobstructive CAD with only 40% distal LAD stenosis, 30% OM1 lesion, 60% distal RCA with negative FFR of 0.94, questionable myocardial bridging with 40% distal LAD lesion. Medical therapy was recommended. Carvedilol was increased to 25 mg twice a day to maximize heart rate and blood pressure control. Lipid panel obtained on 09/05/2016 showed low HDL, otherwise well-controlled total cholesterol, triglycerides and LDL level. Lipitor 10 mg was added prior to discharge given the mild coronary artery disease. She is doing well after discharge, she denies any chest discomfort or shortness of breath. There is no significant lower extremity edema. She will need a fasting lipid panel and LFTs in 2 months.    Past Medical History:  Diagnosis Date  . Arthritis Dx 2006  . Carpal tunnel syndrome   . Diabetes mellitus without  complication ([place]) Dx 6546  . Dyspnea   . Gout   . Heart murmur   . Hypertension Dx 2005  . Unstable angina ([place]) 08/2016    Past Surgical History:  Procedure Laterality Date  . APPENDECTOMY    . INTRAVASCULAR PRESSURE WIRE/FFR STUDY N/A 09/07/2016   Procedure: Intravascular Pressure Wire/FFR Study;  Surgeon: Nelva Bush, MD;  Location: [place] [facility];  Service: Cardiovascular;  Laterality: N/A;  . LEFT HEART CATH AND CORONARY ANGIOGRAPHY N/A 09/07/2016   Procedure: Left Heart Cath and Coronary Angiography;  Surgeon: Nelva Bush, MD;  Location: [place] [facility];  Service: Cardiovascular;  Laterality: N/A;  . TUBAL LIGATION      Current Medications: Outpatient Medications Prior to Visit  Medication Sig Dispense Refill  . ACCU-CHEK SOFTCLIX LANCETS lancets 1 each by Other route daily. ICD 10 E11.21 100 each 12  . atorvastatin (LIPITOR) 10 MG tablet Take 1 tablet (10 mg total) by mouth daily. 30 tablet 0  . Blood Glucose Monitoring Suppl (ACCU-CHEK AVIVA PLUS) w/Device KIT 1 Device by Does not apply route daily. ICD 10 E11.21 1 kit 0  . Blood Pressure KIT 1 each by Does not apply route daily. 1 each 0  . colchicine 0.6 MG tablet Take 1 tablet (0.6 mg total) by mouth 2 (two) times daily as needed (gout flare). 180 tablet 0  . gabapentin (NEURONTIN) 100 MG capsule Take 3 capsules (300 mg total) by mouth at bedtime. (Patient taking differently: Take 100-300 mg by mouth as needed (pain). ) 90 capsule 3  .  glucose blood (ACCU-CHEK AVIVA PLUS) test strip 1 each by Other route daily. ICD 10 E11.21 100 each 12  . indomethacin (INDOCIN) 25 MG capsule Take 1 capsule (25 mg total) by mouth 2 (two) times daily with a meal. (Patient taking differently: Take 25 mg by mouth as needed for mild pain. ) 60 capsule 0  . isosorbide-hydrALAZINE (BIDIL) 20-37.5 MG tablet Take 1 tablet by mouth 3 (three) times daily. 90 tablet 0  . metFORMIN (GLUCOPHAGE-XR) 500 MG 24 hr tablet TAKE 2 TABLETS EVERY DAY   AFTER  SUPPER 180 tablet 3  . traMADol (ULTRAM) 50 MG tablet Take 1 tablet (50 mg total) by mouth every 8 (eight) hours as needed. (Patient taking differently: Take 50 mg by mouth every 8 (eight) hours as needed for moderate pain. ) 60 tablet 2  . Triamcinolone Acetonide (TRIAMCINOLONE 0.1 % CREAM : EUCERIN) CREA Apply 1 application topically 2 (two) times daily as needed. Please mix 1:1 (Patient taking differently: Apply 1 application topically 2 (two) times daily as needed. Please mix 1:1) 1 each 3  . carvedilol (COREG) 25 MG tablet Take 1 tablet (25 mg total) by mouth 2 (two) times daily with a meal. 60 tablet 0   No facility-administered medications prior to visit.      Allergies:   Lisinopril; Hctz [hydrochlorothiazide]; Naproxen; and Norvasc [amlodipine besylate]   Social History   Social History  . Marital status: Single    Spouse name: N/A  . Number of children: N/A  . Years of education: N/A   Social History Main Topics  . Smoking status: Former Research scientist (life sciences)  . Smokeless tobacco: Never Used     Comment: QUIT SMOKING IN 2009  . Alcohol use Yes     Comment: occasionally  . Drug use: No  . Sexual activity: Not Asked   Other Topics Concern  . None   Social History Narrative  . None     Family History:  The patient's family history includes Diabetes in her mother and sister.   ROS:   Please see the history of present illness.    ROS All other systems reviewed and are negative.   PHYSICAL EXAM:   VS:  BP 122/70   Pulse 76   Ht 5' 10"  (1.778 m)   Wt 223 lb (101.2 kg)   BMI 32.00 kg/m    GEN: Well nourished, well developed, in no acute distress  HEENT: normal  Neck: no JVD, carotid bruits, or masses Cardiac: RRR; no murmurs, rubs, or gallops,no edema  Respiratory:  clear to auscultation bilaterally, normal work of breathing GI: soft, nontender, nondistended, + BS MS: no deformity or atrophy  Skin: warm and dry, no rash Neuro:  Alert and Oriented x 3, Strength and  sensation are intact Psych: euthymic mood, full affect  Wt Readings from Last 3 Encounters:  09/25/16 223 lb (101.2 kg)  09/14/16 225 lb 3.2 oz (102.2 kg)  09/08/16 236 lb 1.8 oz (107.1 kg)      Studies/Labs Reviewed:   EKG:  EKG is not ordered today.    Recent Labs: 09/08/2016: Hemoglobin 8.8 09/14/2016: BUN 25; Creatinine, Ser 1.29; Magnesium 1.8; Platelets 305; Potassium 5.1; Sodium 139   Lipid Panel    Component Value Date/Time   CHOL 108 09/05/2016 0228   TRIG 111 09/05/2016 0228   HDL 31 (L) 09/05/2016 0228   CHOLHDL 3.5 09/05/2016 0228   VLDL 22 09/05/2016 0228   LDLCALC 55 09/05/2016 0228    Additional studies/  records that were reviewed today include:   Echo 09/05/2016 LV EF: 50% -   55%  - Left ventricle: Distal septal hypokinesis The cavity size was   mildly dilated. Wall thickness was normal. Systolic function was   normal. The estimated ejection fraction was in the range of 50%   to 55%. Wall motion was normal; there were no regional wall   motion abnormalities. Doppler parameters are consistent with   abnormal left ventricular relaxation (grade 1 diastolic   dysfunction). - Atrial septum: No defect or patent foramen ovale was identified. - Pulmonary arteries: PA peak pressure: 36 mm Hg (S).   Myoview 09/06/2016  There was no ST segment deviation noted during stress.  T wave inversion was noted during stress in the V3, V4, V5 and V6 leads.  Findings consistent with ischemia and prior myocardial infarction.  This is a low risk study.  The left ventricular ejection fraction is normal (55-65%).   Poor quality study with extracardiac activity and diaphragmatic attenuation Cannot r/o inferior MI vs diaphragmatic attenuation small area of apical ischemia EF 57% with apical hypokinesis    Cath 09/07/2016 Conclusion   Conclusions: 1. Non-obstructive coronary artery disease, including 40% distal LAD stenosis, 30% OM1 lesion, and 60% distal RCA stenosis  (FFR 0.94). Question degree of myocardial bridging at 40% distal LAD lesion. 2. Normal left ventricular filling pressure. 3. Systemic hypertension.  Recommendation: 1. Medical therapy; will increase carvedilol to 25 mg BID to maximize heart rate and blood pressure control. 2. Risk factor modification and medical therapy to prevent progression of CAD.       ASSESSMENT:    1. Coronary artery disease involving native coronary artery of native heart without angina pectoris   2. Essential hypertension   3. Medication management   4. Controlled type 2 diabetes mellitus without complication, without long-term current use of insulin (HCC)      PLAN:  In order of problems listed above:  1. CAD: Recently presented to the hospital with chest pain, echo was normal, mildly was slightly abnormal. Underwent cardiac catheterization which showed nonobstructive CAD. Continue medical management. Her aspirin was held due to anemia, hemoglobin obtained by primary care physician on 09/14/2016 showed hemoglobin has increased to 11.0. We will restart 81 mg aspirin. Her recent lipid panel actually shows well-controlled cholesterol, LDL and triglyceride, her HDL was low at 31. She was started on 10 mg Lipitor due to the presence coronary atherosclerosis for risk reduction. She will need a repeat fasting lipid panel and LFT in 2 months  2. HTN: Blood pressure well-controlled at 122/70. She is on very good regimen of cardiac and blood pressure medication including carvedilol and BiDil.  3. DM II: On metformin, managed by primary care physician    Medication Adjustments/Labs and Tests Ordered: Current medicines are reviewed at length with the patient today.  Concerns regarding medicines are outlined above.  Medication changes, Labs and Tests ordered today are listed in the Patient Instructions below. Patient Instructions  Medication Instructions:  START LOW DOSE BABY ASPIRIN 81MG  If you need a refill on  your cardiac medications before your next appointment, please call your pharmacy.  Labwork: FLP AND LFT IN 2 MONTHS AT [facility] ON THE 1ST FLOOR  Follow-Up: Your physician wants you to follow-up in: 3-4 MONTHS WITH DR Martinique You will receive a reminder letter in the mail two months in advance. If you don't receive a letter, please call our office June 2018 to schedule the [facility]  2018 follow-up appointment.   Thank you for choosing [facility] at [employer], [place]  09/26/2016 9:33 AM    [address], [place], [place]  [address] Phone: [phone]; Fax: [phone]

## 2016-09-26 ENCOUNTER — Encounter: Payer: Self-pay | Admitting: Physician Assistant

## 2016-09-30 ENCOUNTER — Telehealth: Payer: Self-pay

## 2016-09-30 NOTE — Telephone Encounter (Signed)
Pt was called and informed of lab results. 

## 2016-10-06 ENCOUNTER — Ambulatory Visit: Payer: Medicare HMO

## 2016-10-06 ENCOUNTER — Telehealth: Payer: Self-pay | Admitting: Family Medicine

## 2016-10-06 DIAGNOSIS — I10 Essential (primary) hypertension: Secondary | ICD-10-CM

## 2016-10-06 MED ORDER — ISOSORB DINITRATE-HYDRALAZINE 20-37.5 MG PO TABS: 1.0000 | ORAL_TABLET | Freq: Three times a day (TID) | ORAL | 0 refills | Status: DC

## 2016-10-06 MED ORDER — CARVEDILOL 25 MG PO TABS: 25.0000 mg | ORAL_TABLET | Freq: Two times a day (BID) | ORAL | 0 refills | Status: DC

## 2016-10-06 MED ORDER — ATORVASTATIN CALCIUM 10 MG PO TABS: 10.0000 mg | ORAL_TABLET | Freq: Every day | ORAL | 0 refills | Status: DC

## 2016-10-06 NOTE — Telephone Encounter (Signed)
Patient called the office to request medication refill for isosorbide-hydrALAZINE (BIDIL) 20-37.5 MG tablet, carvedilol (COREG) 25 MG tablet and atorvastatin (LIPITOR) 10 MG tablet. Please send all medications to our pharmacy.  Thank you.

## 2016-10-06 NOTE — Telephone Encounter (Signed)
Requested medications refilled and sent to [facility] pharmacy

## 2016-10-07 MED FILL — CARVEDILOL 25 MG TABLET: 25 | 30 days supply | Qty: 60 | Fill #0

## 2016-10-07 MED FILL — BIDIL TABLET: 20-37.5 | 30 days supply | Qty: 90 | Fill #0

## 2016-10-07 MED FILL — ATORVASTATIN 10 MG TABLET: 10 | 30 days supply | Qty: 30 | Fill #0

## 2016-10-12 ENCOUNTER — Other Ambulatory Visit: Payer: Self-pay | Admitting: *Deleted

## 2016-10-21 ENCOUNTER — Ambulatory Visit
Admission: RE | Admit: 2016-10-21 | Discharge: 2016-10-21 | Disposition: A | Discharge status: Home / Self Care | Payer: Medicare HMO | Source: Ambulatory Visit | Attending: Family Medicine | Admitting: Family Medicine

## 2016-10-21 ENCOUNTER — Encounter: Payer: Self-pay | Admitting: Family Medicine

## 2016-10-21 DIAGNOSIS — Z1231 Encounter for screening mammogram for malignant neoplasm of breast: Secondary | ICD-10-CM

## 2016-10-26 ENCOUNTER — Other Ambulatory Visit: Payer: Self-pay | Admitting: Pharmacist

## 2016-10-26 MED ORDER — ATORVASTATIN CALCIUM 10 MG PO TABS: 10.0000 mg | ORAL_TABLET | Freq: Every day | ORAL | 0 refills | Status: DC

## 2016-10-30 MED FILL — CARVEDILOL 25 MG TABLET: 25 | 30 days supply | Qty: 60 | Fill #1

## 2016-10-30 MED FILL — ATORVASTATIN 10 MG TABLET: 10 | 30 days supply | Qty: 30 | Fill #1

## 2016-11-03 ENCOUNTER — Other Ambulatory Visit: Payer: Self-pay | Admitting: Family Medicine

## 2016-11-03 DIAGNOSIS — G5601 Carpal tunnel syndrome, right upper limb: Secondary | ICD-10-CM

## 2016-11-04 ENCOUNTER — Telehealth: Payer: Self-pay | Admitting: Family Medicine

## 2016-11-04 DIAGNOSIS — G5601 Carpal tunnel syndrome, right upper limb: Secondary | ICD-10-CM

## 2016-11-04 NOTE — Telephone Encounter (Signed)
Pt calling to request a refill for Tramadol. Please f/u.

## 2016-11-05 MED ORDER — TRAMADOL HCL 50 MG PO TABS: 50.0000 mg | ORAL_TABLET | Freq: Three times a day (TID) | ORAL | 0 refills | Status: DC | PRN

## 2016-11-05 NOTE — Telephone Encounter (Signed)
Please inform patient that tramadol ready for pick up

## 2016-11-06 ENCOUNTER — Encounter: Payer: Self-pay | Admitting: Family Medicine

## 2016-11-06 ENCOUNTER — Ambulatory Visit: Payer: Medicare HMO | Attending: Family Medicine | Admitting: Family Medicine

## 2016-11-06 ENCOUNTER — Other Ambulatory Visit: Payer: Self-pay | Admitting: Family Medicine

## 2016-11-06 VITALS — BP 109/67 | HR 71 | Temp 98.3°F | Wt 224.8 lb

## 2016-11-06 DIAGNOSIS — Z87891 Personal history of nicotine dependence: Secondary | ICD-10-CM | POA: Insufficient documentation

## 2016-11-06 DIAGNOSIS — M109 Gout, unspecified: Secondary | ICD-10-CM | POA: Insufficient documentation

## 2016-11-06 DIAGNOSIS — G4700 Insomnia, unspecified: Secondary | ICD-10-CM

## 2016-11-06 DIAGNOSIS — Z7982 Long term (current) use of aspirin: Secondary | ICD-10-CM | POA: Insufficient documentation

## 2016-11-06 DIAGNOSIS — I10 Essential (primary) hypertension: Secondary | ICD-10-CM | POA: Diagnosis not present

## 2016-11-06 DIAGNOSIS — Z7984 Long term (current) use of oral hypoglycemic drugs: Secondary | ICD-10-CM | POA: Diagnosis not present

## 2016-11-06 DIAGNOSIS — E1121 Type 2 diabetes mellitus with diabetic nephropathy: Secondary | ICD-10-CM | POA: Diagnosis not present

## 2016-11-06 LAB — GLUCOSE, POCT (MANUAL RESULT ENTRY): POC GLUCOSE: 195 mg/dL — AB (ref 70–99)

## 2016-11-06 MED ORDER — ISOSORB DINITRATE-HYDRALAZINE 20-37.5 MG PO TABS: 1.0000 | ORAL_TABLET | Freq: Three times a day (TID) | ORAL | 11 refills | Status: DC

## 2016-11-06 MED ORDER — GLUCOSE BLOOD VI STRP: 1.0000 | ORAL_STRIP | Freq: Every day | 12 refills | Status: DC

## 2016-11-06 MED ORDER — ZOLPIDEM TARTRATE 5 MG PO TABS: 5.0000 mg | ORAL_TABLET | Freq: Every evening | ORAL | 1 refills | Status: DC | PRN

## 2016-11-06 MED ORDER — ACCU-CHEK AVIVA PLUS W/DEVICE KIT: 1.0000 | PACK | Freq: Every day | 0 refills | Status: DC

## 2016-11-06 MED ORDER — ACCU-CHEK SOFTCLIX LANCETS MISC: 1.0000 | Freq: Every day | 12 refills | Status: DC

## 2016-11-06 MED FILL — ACCU-CHEK SOFTCLIX LANCETS: 30 days supply | Qty: 100 | Fill #0

## 2016-11-06 MED FILL — ACCU-CHEK AVIVA PLUS TEST S: 30 days supply | Qty: 100 | Fill #0

## 2016-11-06 MED FILL — traMADol HCL 50 MG TABS: 50 | 30 days supply | Qty: 90 | Fill #0

## 2016-11-06 MED FILL — ACCU-CHEK AVIVA PLUS METER: W/DEVICE | 30 days supply | Qty: 1 | Fill #0

## 2016-11-06 MED FILL — BIDIL TABLET: 20-37.5 | 30 days supply | Qty: 90 | Fill #0

## 2016-11-06 NOTE — Progress Notes (Signed)
Subjective:  Patient ID: Desiree Sanchez, female    DOB: Sep 10, 1950  Age: 66 y.o. MRN: [id number]  CC: Hypertension   HPI Desiree Sanchez has diabetes, HTN, gout she presents for    1. HTN: she is compliant with current regimen. No HA, CP, SOB, dizziness or lightheadedness.   2. Trouble sleeping: 2-3 times a week. She is unable to sleep. This has been going on for the past 6 months. She denies depression. She is going through [employer]. She reports staying up thinking. She has tried herbal tea. She has never tried melatonin. She used to take ibuprofen to help her sleep but has stopped after her hospitalization last month. She was prescribed Ambien in the hospital and would like some for home.    Social History  Substance Use Topics  . Smoking status: Former Research scientist (life sciences)  . Smokeless tobacco: Never Used     Comment: QUIT SMOKING IN 2009  . Alcohol use Yes     Comment: occasionally    Outpatient Medications Prior to Visit  Medication Sig Dispense Refill  . ACCU-CHEK SOFTCLIX LANCETS lancets 1 each by Other route daily. ICD 10 E11.21 100 each 12  . aspirin EC 81 MG tablet Take 1 tablet (81 mg total) by mouth daily. 90 tablet 3  . atorvastatin (LIPITOR) 10 MG tablet Take 1 tablet (10 mg total) by mouth daily. 90 tablet 0  . Blood Glucose Monitoring Suppl (ACCU-CHEK AVIVA PLUS) w/Device KIT 1 Device by Does not apply route daily. ICD 10 E11.21 1 kit 0  . Blood Pressure KIT 1 each by Does not apply route daily. 1 each 0  . carvedilol (COREG) 25 MG tablet Take 1 tablet (25 mg total) by mouth 2 (two) times daily with a meal. 180 tablet 0  . colchicine 0.6 MG tablet Take 1 tablet (0.6 mg total) by mouth 2 (two) times daily as needed (gout flare). 180 tablet 0  . gabapentin (NEURONTIN) 100 MG capsule Take 3 capsules (300 mg total) by mouth at bedtime. (Patient taking differently: Take 100-300 mg by mouth as needed (pain). ) 90 capsule 3  . glucose blood (ACCU-CHEK AVIVA PLUS) test strip  1 each by Other route daily. ICD 10 E11.21 100 each 12  . indomethacin (INDOCIN) 25 MG capsule Take 1 capsule (25 mg total) by mouth 2 (two) times daily with a meal. (Patient taking differently: Take 25 mg by mouth as needed for mild pain. ) 60 capsule 0  . isosorbide-hydrALAZINE (BIDIL) 20-37.5 MG tablet Take 1 tablet by mouth 3 (three) times daily. 90 tablet 0  . metFORMIN (GLUCOPHAGE-XR) 500 MG 24 hr tablet TAKE 2 TABLETS EVERY DAY  AFTER  SUPPER 180 tablet 3  . traMADol (ULTRAM) 50 MG tablet Take 1 tablet (50 mg total) by mouth every 8 (eight) hours as needed for moderate pain. 90 tablet 0  . Triamcinolone Acetonide (TRIAMCINOLONE 0.1 % CREAM : EUCERIN) CREA Apply 1 application topically 2 (two) times daily as needed. Please mix 1:1 (Patient taking differently: Apply 1 application topically 2 (two) times daily as needed. Please mix 1:1) 1 each 3   No facility-administered medications prior to visit.     ROS Review of Systems  Constitutional: Negative for chills and fever.  Eyes: Negative for visual disturbance.  Respiratory: Negative for shortness of breath.   Cardiovascular: Negative for chest pain.  Gastrointestinal: Negative for abdominal distention, abdominal pain, anal bleeding, blood in stool, constipation, diarrhea, nausea, rectal pain and vomiting.  Musculoskeletal: Negative for arthralgias and back pain.  Skin: Negative for rash.  Allergic/Immunologic: Negative for immunocompromised state.  Hematological: Negative for adenopathy. Does not bruise/bleed easily.  Psychiatric/Behavioral: Positive for sleep disturbance. Negative for dysphoric mood and suicidal ideas.    Objective:  BP 109/67   Pulse 71   Temp 98.3 F (36.8 C) (Oral)   Wt 224 lb 12.8 oz (102 kg)   SpO2 98%   BMI 32.26 kg/m   BP/Weight 11/06/2016 8/42/1031 07/16/1186  Systolic BP 677 373 668  Diastolic BP 67 70 76  Wt. (Lbs) 224.8 223 225.2  BMI 32.26 32 32.31    Physical Exam  Constitutional: She is  oriented to person, place, and time. She appears well-developed and well-nourished. No distress.  HENT:  Head: Normocephalic and atraumatic.  Cardiovascular: Normal rate, regular rhythm, normal heart sounds and intact distal pulses.   Pulmonary/Chest: Effort normal and breath sounds normal.  Abdominal: Soft. Bowel sounds are normal. She exhibits no distension and no mass. There is no tenderness. There is no rebound and no guarding.  Genitourinary: Rectal exam shows no external hemorrhoid, no internal hemorrhoid, no fissure, no mass, no tenderness, anal tone normal and guaiac negative stool.  Musculoskeletal: She exhibits no edema.  Neurological: She is alert and oriented to person, place, and time.  Skin: Skin is warm and dry. No rash noted.  Psychiatric: She has a normal mood and affect.    Lab Results  Component Value Date   HGBA1C 6.3 (H) 09/05/2016   CBG 120  Assessment & Plan:   Moyinoluwa was seen today for hypertension.  Diagnoses and all orders for this visit:  Essential hypertension -     Basic metabolic panel -     isosorbide-hydrALAZINE (BIDIL) 20-37.5 MG tablet; Take 1 tablet by mouth 3 (three) times daily.  Type 2 diabetes mellitus with diabetic nephropathy, without long-term current use of insulin (HCC) -     POCT glucose (manual entry) -     ACCU-CHEK SOFTCLIX LANCETS lancets; 1 each by Other route daily. ICD 10 E11.21 -     Blood Glucose Monitoring Suppl (ACCU-CHEK AVIVA PLUS) w/Device KIT; 1 Device by Does not apply route daily. ICD 10 E11.21 -     glucose blood (ACCU-CHEK AVIVA PLUS) test strip; 1 each by Other route daily. ICD 10 E11.21  Insomnia, unspecified type -     zolpidem (AMBIEN) 5 MG tablet; Take 1 tablet (5 mg total) by mouth at bedtime as needed for sleep.    No orders of the defined types were placed in this encounter.   Follow-up: No Follow-up on file.   Boykin Nearing MD

## 2016-11-06 NOTE — Patient Instructions (Addendum)
Desiree Sanchez was seen today for hypertension.  Diagnoses and all orders for this visit:  Essential hypertension -     Basic metabolic panel -     isosorbide-hydrALAZINE (BIDIL) 20-37.5 MG tablet; Take 1 tablet by mouth 3 (three) times daily.  Type 2 diabetes mellitus with diabetic nephropathy, without long-term current use of insulin (HCC) -     POCT glucose (manual entry) -     ACCU-CHEK SOFTCLIX LANCETS lancets; 1 each by Other route daily. ICD 10 E11.21 -     Blood Glucose Monitoring Suppl (ACCU-CHEK AVIVA PLUS) w/Device KIT; 1 Device by Does not apply route daily. ICD 10 E11.21 -     glucose blood (ACCU-CHEK AVIVA PLUS) test strip; 1 each by Other route daily. ICD 10 E11.21  Insomnia, unspecified type -     zolpidem (AMBIEN) 5 MG tablet; Take 1 tablet (5 mg total) by mouth at bedtime as needed for sleep.  Try melatonin as a natural sleep aid.   I checked with our pharmacist  The recall for the accu test strips  was just for one particular batch. The new batch of strips is accurate.   F/u in 3 months for HTN and diabetes   Dr. Adrian Blackwater

## 2016-11-06 NOTE — Assessment & Plan Note (Signed)
Normal BP Compliant with current regimen will continue  Checking renal function

## 2016-11-06 NOTE — Assessment & Plan Note (Signed)
Intermittent insomnia Plan: Ambien 5-10 mg nightly prn after trying melatonin

## 2016-11-06 NOTE — Telephone Encounter (Signed)
Pt received script in ov on 6/01

## 2016-11-07 LAB — BASIC METABOLIC PANEL
BUN / CREAT RATIO: 17 (ref 12–28)
BUN: 21 mg/dL (ref 8–27)
CO2: 21 mmol/L (ref 18–29)
Calcium: 9.5 mg/dL (ref 8.7–10.3)
Chloride: 108 mmol/L — ABNORMAL HIGH (ref 96–106)
Creatinine, Ser: 1.26 mg/dL — ABNORMAL HIGH (ref 0.57–1.00)
GFR calc non Af Amer: 44 mL/min/{1.73_m2} — ABNORMAL LOW (ref >59)
GFR, EST AFRICAN AMERICAN: 51 mL/min/{1.73_m2} — AB (ref >59)
Glucose: 188 mg/dL — ABNORMAL HIGH (ref 65–99)
POTASSIUM: 4.5 mmol/L (ref 3.5–5.2)
SODIUM: 143 mmol/L (ref 134–144)

## 2016-11-09 NOTE — Addendum Note (Signed)
Addended by: Boykin Nearing on: 11/09/2016 04:47 PM   Modules accepted: Orders

## 2016-11-11 ENCOUNTER — Telehealth: Payer: Self-pay

## 2016-11-11 NOTE — Telephone Encounter (Signed)
Pt was called and informed of lab results. 

## 2016-12-10 ENCOUNTER — Other Ambulatory Visit: Payer: Self-pay | Admitting: Family Medicine

## 2016-12-10 DIAGNOSIS — G5601 Carpal tunnel syndrome, right upper limb: Secondary | ICD-10-CM

## 2016-12-11 MED FILL — CARVEDILOL 25 MG TABLET: 25 | 30 days supply | Qty: 60 | Fill #2

## 2016-12-11 MED FILL — ATORVASTATIN 10 MG TABLET: 10 | 30 days supply | Qty: 30 | Fill #2

## 2016-12-11 MED FILL — BIDIL TABLET: 20-37.5 | 30 days supply | Qty: 90 | Fill #1

## 2017-01-11 ENCOUNTER — Other Ambulatory Visit: Payer: Self-pay | Admitting: Family Medicine

## 2017-01-11 MED FILL — BIDIL TABLET: 20-37.5 | 30 days supply | Qty: 90 | Fill #2

## 2017-01-11 MED FILL — ATORVASTATIN 10 MG TABLET: 10 | 30 days supply | Qty: 30 | Fill #0

## 2017-01-12 MED FILL — CARVEDILOL 25 MG TABLET: 25 | 90 days supply | Qty: 180 | Fill #0

## 2017-01-13 DIAGNOSIS — H2513 Age-related nuclear cataract, bilateral: Secondary | ICD-10-CM | POA: Diagnosis not present

## 2017-01-13 DIAGNOSIS — H4323 Crystalline deposits in vitreous body, bilateral: Secondary | ICD-10-CM | POA: Diagnosis not present

## 2017-01-13 DIAGNOSIS — E119 Type 2 diabetes mellitus without complications: Secondary | ICD-10-CM | POA: Diagnosis not present

## 2017-01-13 DIAGNOSIS — H524 Presbyopia: Secondary | ICD-10-CM | POA: Diagnosis not present

## 2017-01-13 DIAGNOSIS — H5203 Hypermetropia, bilateral: Secondary | ICD-10-CM | POA: Diagnosis not present

## 2017-01-25 ENCOUNTER — Other Ambulatory Visit: Payer: Self-pay | Admitting: Pharmacist

## 2017-01-25 MED ORDER — TRIAMCINOLONE ACETONIDE 0.1 % EX CREA: 1.0000 "application " | TOPICAL_CREAM | Freq: Two times a day (BID) | CUTANEOUS | 0 refills | Status: DC

## 2017-02-08 ENCOUNTER — Encounter: Payer: Self-pay | Admitting: Internal Medicine

## 2017-02-08 DIAGNOSIS — I2510 Atherosclerotic heart disease of native coronary artery without angina pectoris: Secondary | ICD-10-CM | POA: Insufficient documentation

## 2017-02-09 ENCOUNTER — Ambulatory Visit: Payer: Medicare HMO | Admitting: Internal Medicine

## 2017-02-16 ENCOUNTER — Encounter: Payer: Self-pay | Admitting: Internal Medicine

## 2017-02-16 ENCOUNTER — Ambulatory Visit: Payer: Medicare HMO | Attending: Internal Medicine | Admitting: Internal Medicine

## 2017-02-16 VITALS — BP 124/74 | HR 54 | Temp 98.5°F | Resp 16 | Wt 231.4 lb

## 2017-02-16 DIAGNOSIS — M25512 Pain in left shoulder: Secondary | ICD-10-CM | POA: Insufficient documentation

## 2017-02-16 DIAGNOSIS — E118 Type 2 diabetes mellitus with unspecified complications: Secondary | ICD-10-CM

## 2017-02-16 DIAGNOSIS — Z6830 Body mass index (BMI) 30.0-30.9, adult: Secondary | ICD-10-CM | POA: Diagnosis not present

## 2017-02-16 DIAGNOSIS — Z9889 Other specified postprocedural states: Secondary | ICD-10-CM | POA: Insufficient documentation

## 2017-02-16 DIAGNOSIS — I129 Hypertensive chronic kidney disease with stage 1 through stage 4 chronic kidney disease, or unspecified chronic kidney disease: Secondary | ICD-10-CM | POA: Insufficient documentation

## 2017-02-16 DIAGNOSIS — Z833 Family history of diabetes mellitus: Secondary | ICD-10-CM | POA: Diagnosis not present

## 2017-02-16 DIAGNOSIS — G5601 Carpal tunnel syndrome, right upper limb: Secondary | ICD-10-CM | POA: Insufficient documentation

## 2017-02-16 DIAGNOSIS — Z888 Allergy status to other drugs, medicaments and biological substances status: Secondary | ICD-10-CM | POA: Diagnosis not present

## 2017-02-16 DIAGNOSIS — N183 Chronic kidney disease, stage 3 unspecified: Secondary | ICD-10-CM

## 2017-02-16 DIAGNOSIS — G4700 Insomnia, unspecified: Secondary | ICD-10-CM | POA: Insufficient documentation

## 2017-02-16 DIAGNOSIS — Z79899 Other long term (current) drug therapy: Secondary | ICD-10-CM | POA: Diagnosis not present

## 2017-02-16 DIAGNOSIS — R011 Cardiac murmur, unspecified: Secondary | ICD-10-CM | POA: Diagnosis not present

## 2017-02-16 DIAGNOSIS — E669 Obesity, unspecified: Secondary | ICD-10-CM | POA: Insufficient documentation

## 2017-02-16 DIAGNOSIS — D509 Iron deficiency anemia, unspecified: Secondary | ICD-10-CM | POA: Insufficient documentation

## 2017-02-16 DIAGNOSIS — E1121 Type 2 diabetes mellitus with diabetic nephropathy: Secondary | ICD-10-CM | POA: Diagnosis not present

## 2017-02-16 DIAGNOSIS — I10 Essential (primary) hypertension: Secondary | ICD-10-CM

## 2017-02-16 DIAGNOSIS — E559 Vitamin D deficiency, unspecified: Secondary | ICD-10-CM | POA: Diagnosis not present

## 2017-02-16 DIAGNOSIS — Z87891 Personal history of nicotine dependence: Secondary | ICD-10-CM | POA: Insufficient documentation

## 2017-02-16 DIAGNOSIS — E1122 Type 2 diabetes mellitus with diabetic chronic kidney disease: Secondary | ICD-10-CM | POA: Insufficient documentation

## 2017-02-16 DIAGNOSIS — Z7984 Long term (current) use of oral hypoglycemic drugs: Secondary | ICD-10-CM | POA: Diagnosis not present

## 2017-02-16 DIAGNOSIS — Z23 Encounter for immunization: Secondary | ICD-10-CM | POA: Insufficient documentation

## 2017-02-16 DIAGNOSIS — Z79891 Long term (current) use of opiate analgesic: Secondary | ICD-10-CM | POA: Diagnosis not present

## 2017-02-16 DIAGNOSIS — M109 Gout, unspecified: Secondary | ICD-10-CM | POA: Insufficient documentation

## 2017-02-16 DIAGNOSIS — G8929 Other chronic pain: Secondary | ICD-10-CM | POA: Insufficient documentation

## 2017-02-16 DIAGNOSIS — Z7982 Long term (current) use of aspirin: Secondary | ICD-10-CM | POA: Diagnosis not present

## 2017-02-16 DIAGNOSIS — I2510 Atherosclerotic heart disease of native coronary artery without angina pectoris: Secondary | ICD-10-CM | POA: Diagnosis not present

## 2017-02-16 LAB — POCT GLYCOSYLATED HEMOGLOBIN (HGB A1C): HEMOGLOBIN A1C: 6.2

## 2017-02-16 LAB — GLUCOSE, POCT (MANUAL RESULT ENTRY): POC Glucose: 140 mg/dl — AB (ref 70–99)

## 2017-02-16 MED ORDER — ATORVASTATIN CALCIUM 10 MG PO TABS: 10.0000 mg | ORAL_TABLET | Freq: Every day | ORAL | 3 refills | Status: DC

## 2017-02-16 MED ORDER — DICLOFENAC SODIUM 1 % TD GEL: 2.0000 g | Freq: Four times a day (QID) | TRANSDERMAL | 2 refills | Status: DC

## 2017-02-16 NOTE — Progress Notes (Signed)
Patient ID: Desiree Sanchez, female    DOB: [birth year]  MRN: [id number]  CC: re-establish; Diabetes; and Hypertension   Subjective: Desiree Sanchez is a 66 y.o. female who presents for chronic ds management. Last saw Dr. Adrian Blackwater 11/2016. 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 CKD stage 3 -aware that kidney function improved based on last blood test. eGFR from 49 to 51  2. DM:  Checking BS once a wk.  Compliant with Metformin and tolerating OK Eating Habits: drinks mainly water; limits white carbs; eating smaller portions; eats a lot of fruits and veggies.  -gained 7 lbs since last visit. Use to go to the [facility] to swim a few times a wk. Plans to get back but currently having to bady sit. Has to work out a schedule. Eye exam was done 3 weeks ago. Reports being told no changes on the eye from the diabetes  3. HTN/CAD -no device to check BP. No CP/SOB/LE edema -limits salt  4. Insomnia:  Better. Given Ambien on last visit. Takes about once a wk. she does not want to become dependent on medication. Takes gabapentin which also will helps her sleep better    Given Gabapentin some while ago for flair of CTS in RT hand from constant crocheting which she does has a hobby. Saw sports med and was given a brace. Pain resolved after 1 mth. No problems since. -has dull ache in the LT shoulder. Told by Dr. Adrian Blackwater likely nerves; so she has remained on Gabapentin more so for this. never x-rayed. Gabapentin helps. -Given tramadol also but again uses sparingly. Would prefer a pain rub -no gout flare in a while  Patient Active Problem List   Diagnosis Date Noted  . CAD (coronary artery disease) 02/08/2017  . Insomnia 11/06/2016  . Iron deficiency anemia 09/14/2016  . Shoulder pain, left 02/11/2016  . Right carpal tunnel syndrome 07/29/2014  . Vitamin D  insufficiency 04/13/2014  . Obesity (BMI 30-39.9) 03/13/2013  . Gout 05/20/2009  . HEART MURMUR, SYSTOLIC 66/29/4765  . DM2 (diabetes mellitus, type 2) ([place]) 04/22/2009  . Essential hypertension 04/22/2009     Current Outpatient Prescriptions on File Prior to Visit  Medication Sig Dispense Refill  . ACCU-CHEK SOFTCLIX LANCETS lancets 1 each by Other route daily. ICD 10 E11.21 100 each 12  . aspirin EC 81 MG tablet Take 1 tablet (81 mg total) by mouth daily. 90 tablet 3  . Blood Glucose Monitoring Suppl (ACCU-CHEK AVIVA PLUS) w/Device KIT 1 Device by Does not apply route daily. ICD 10 E11.21 1 kit 0  . Blood Pressure KIT 1 each by Does not apply route daily. 1 each 0  . carvedilol (COREG) 25 MG tablet Take 1 tablet (25 mg total) by mouth 2 (two) times daily with a meal. 180 tablet 0  . colchicine 0.6 MG tablet Take 1 tablet (0.6 mg total) by mouth 2 (two) times daily as needed (gout flare). 180 tablet 0  . gabapentin (NEURONTIN) 100 MG capsule TAKE 3 CAPSULES AT BEDTIME 270 capsule 0  . glucose blood (ACCU-CHEK AVIVA PLUS) test strip 1 each by Other route daily. ICD 10 E11.21 100 each 12  . isosorbide-hydrALAZINE (BIDIL) 20-37.5 MG tablet Take 1 tablet by mouth 3 (three) times daily. 90 tablet 11  . metFORMIN (GLUCOPHAGE-XR) 500 MG 24 hr tablet TAKE  2 TABLETS EVERY DAY  AFTER  SUPPER 180 tablet 3  . traMADol (ULTRAM) 50 MG tablet Take 1 tablet (50 mg total) by mouth every 8 (eight) hours as needed for moderate pain. 90 tablet 0  . Triamcinolone Acetonide (TRIAMCINOLONE 0.1 % CREAM : EUCERIN) CREA Apply 1 application topically 2 (two) times daily as needed. Please mix 1:1 (Patient taking differently: Apply 1 application topically 2 (two) times daily as needed. Please mix 1:1) 1 each 3  . triamcinolone cream (KENALOG) 0.1 % Apply 1 application topically 2 (two) times daily. 30 g 0  . zolpidem (AMBIEN) 5 MG tablet Take 1 tablet (5 mg total) by mouth at bedtime as needed for sleep. 30 tablet 1    No current facility-administered medications on file prior to visit.     Allergies  Allergen Reactions  . Lisinopril Swelling    REACTION: Swolen lips (only)  . Hctz [Hydrochlorothiazide] Other (See Comments)    Joint pain   . Naproxen Itching and Palpitations  . Norvasc [Amlodipine Besylate] Other (See Comments)    Peripheral edema     Social History   Social History  . Marital status: Single    Spouse name: N/A  . Number of children: N/A  . Years of education: N/A   Occupational History  . Not on file.   Social History Main Topics  . Smoking status: Former Research scientist (life sciences)  . Smokeless tobacco: Never Used     Comment: QUIT SMOKING IN 2009  . Alcohol use Yes     Comment: occasionally  . Drug use: No  . Sexual activity: Not on file   Other Topics Concern  . Not on file   Social History Narrative  . No narrative on file    Family History  Problem Relation Age of Onset  . Diabetes Mother   . Diabetes Sister     Past Surgical History:  Procedure Laterality Date  . APPENDECTOMY    . INTRAVASCULAR PRESSURE WIRE/FFR STUDY N/A 09/07/2016   Procedure: Intravascular Pressure Wire/FFR Study;  Surgeon: Nelva Bush, MD;  Location: [place] [facility];  Service: Cardiovascular;  Laterality: N/A;  . LEFT HEART CATH AND CORONARY ANGIOGRAPHY N/A 09/07/2016   Procedure: Left Heart Cath and Coronary Angiography;  Surgeon: Nelva Bush, MD;  Location: [place] [facility];  Service: Cardiovascular;  Laterality: N/A;  . TUBAL LIGATION      ROS: Review of Systems Negative except as stated above PHYSICAL EXAM: BP 124/74   Pulse (!) 54   Temp 98.5 F (36.9 C) (Oral)   Resp 16   Wt 231 lb 6.4 oz (105 kg)   SpO2 97%   BMI 33.20 kg/m   Wt Readings from Last 3 Encounters:  02/16/17 231 lb 6.4 oz (105 kg)  11/06/16 224 lb 12.8 oz (102 kg)  09/25/16 223 lb (101.2 kg)    Physical Exam General appearance - alert, well appearing, older African-American female and in no  distress Mental status - alert, oriented to person, place, and time, normal mood, behavior, speech, dress, motor activity, and thought processes Neck - supple, no significant adenopathy Chest - clear to auscultation, no wheezes, rales or rhonchi, symmetric air entry Heart - normal rate, regular rhythm, normal S1, S2, no murmurs, rubs, clicks or gallops Extremities - peripheral pulses normal, no pedal edema, no clubbing or cyanosis MSK: LT shoulder - no point tenderness. Good ROM Lab Results  Component Value Date   WBC 7.2 09/14/2016   HGB 11.0 (L)  09/14/2016   HCT 34.0 09/14/2016   MCV 88 09/14/2016   PLT 305 09/14/2016     Chemistry      Component Value Date/Time   NA 143 11/06/2016 1050   K 4.5 11/06/2016 1050   CL 108 (H) 11/06/2016 1050   CO2 21 11/06/2016 1050   BUN 21 11/06/2016 1050   CREATININE 1.26 (H) 11/06/2016 1050   CREATININE 1.19 (H) 08/03/2016 1529      Component Value Date/Time   CALCIUM 9.5 11/06/2016 1050   ALKPHOS 83 01/03/2015 1149   AST 15 01/03/2015 1149   ALT 10 01/03/2015 1149   BILITOT 0.4 01/03/2015 1149     Lab Results  Component Value Date   CHOL 108 09/05/2016   HDL 31 (L) 09/05/2016   LDLCALC 55 09/05/2016   TRIG 111 09/05/2016   CHOLHDL 3.5 09/05/2016   Results for orders placed or performed in visit on 02/16/17  POCT glucose (manual entry)  Result Value Ref Range   POC Glucose 140 (A) 70 - 99 mg/dl  POCT glycosylated hemoglobin (Hb A1C)  Result Value Ref Range   Hemoglobin A1C 6.2     ASSESSMENT AND PLAN: 1. Controlled type 2 diabetes mellitus with complication, without long-term current use of insulin (HCC) -Continue metformin at current dose. -Encourage her to limit white carbs. We discussed the plate size. -She plans to get back to the [facility] for  exercise - POCT glucose (manual entry) - POCT glycosylated hemoglobin (Hb A1C)  2. Essential hypertension At goal. Continue current medications. Continue low-salt intake.  3.  Coronary artery disease involving native coronary artery of native heart without angina pectoris -Stable without any angina. Continue aspirin, Coreg, statin and BiDil - atorvastatin (LIPITOR) 10 MG tablet; Take 1 tablet (10 mg total) by mouth daily.  Dispense: 90 tablet; Refill: 3  4. Insomnia, unspecified type -Discussed good sleep hygiene including getting in bed around the same time every night, turning off all lights and sounds, avoiding caffeinated and alcoholic beverages past 5 PM, -Using Ambien sparingly.  No RF requested today; still has a lot left  5. CKD (chronic kidney disease) stage 3, GFR 30-59 ml/min -eGFR improved. Kidney health discussed. Avoid NSAIDs.  6. Chronic left shoulder pain - diclofenac sodium (VOLTAREN) 1 % GEL; Apply 2 g topically 4 (four) times daily.  Dispense: 100 g; Refill: 2  7. Need for Streptococcus pneumoniae and influenza vaccination - Pneumococcal conjugate vaccine 13-valent IM - Flu Vaccine QUAD 6+ mos PF IM (Fluarix Quad PF)  Patient was given the opportunity to ask questions.  Patient verbalized understanding of the plan and was able to repeat key elements of the plan.   Orders Placed This Encounter  Procedures  . Pneumococcal conjugate vaccine 13-valent IM  . Flu Vaccine QUAD 6+ mos PF IM (Fluarix Quad PF)  . POCT glucose (manual entry)  . POCT glycosylated hemoglobin (Hb A1C)     Requested Prescriptions   Signed Prescriptions Disp Refills  . atorvastatin (LIPITOR) 10 MG tablet 90 tablet 3    Sig: Take 1 tablet (10 mg total) by mouth daily.  . diclofenac sodium (VOLTAREN) 1 % GEL 100 g 2    Sig: Apply 2 g topically 4 (four) times daily.    Return in about 3 months (around 05/18/2017).  Karle Plumber, MD, FACP

## 2017-02-16 NOTE — Patient Instructions (Addendum)
Try to get back to the [facility] 2-3 times a week for exercise.   Try to follow the sleep hygiene recommendations that we discussed today: -get in bed around the same time every night. -turn off all lights and sounds -do not drink any caffeinated or alcoholic beverages after 5 PM. -If you're unable to fall asleep after 30-40 minutes you should get up and read or do something else until he feels sleepy..   Influenza Virus Vaccine injection (Fluarix) What is this medicine? INFLUENZA VIRUS VACCINE (in floo EN zuh VAHY ruhs vak SEEN) helps to reduce the risk of getting influenza also known as the flu. This medicine may be used for other purposes; ask your health care provider or pharmacist if you have questions. COMMON BRAND NAME(S): Fluarix, Fluzone What should I tell my health care provider before I take this medicine? They need to know if you have any of these conditions: -bleeding disorder like hemophilia -fever or infection -Guillain-Barre syndrome or other neurological problems -immune system problems -infection with the human immunodeficiency virus (HIV) or AIDS -low blood platelet counts -multiple sclerosis -an unusual or allergic reaction to influenza virus vaccine, eggs, chicken proteins, latex, gentamicin, other medicines, foods, dyes or preservatives -pregnant or trying to get pregnant -breast-feeding How should I use this medicine? This vaccine is for injection into a muscle. It is given by a health care professional. A copy of Vaccine Information Statements will be given before each vaccination. Read this sheet carefully each time. The sheet may change frequently. Talk to your pediatrician regarding the use of this medicine in children. Special care may be needed. Overdosage: If you think you have taken too much of this medicine contact a poison control center or emergency room at once. NOTE: This medicine is only for you. Do not share this medicine with others. What if I miss a  dose? This does not apply. What may interact with this medicine? -chemotherapy or radiation therapy -medicines that lower your immune system like etanercept, anakinra, infliximab, and adalimumab -medicines that treat or prevent blood clots like warfarin -phenytoin -steroid medicines like prednisone or cortisone -theophylline -vaccines This list may not describe all possible interactions. Give your health care provider a list of all the medicines, herbs, non-prescription drugs, or dietary supplements you use. Also tell them if you smoke, drink alcohol, or use illegal drugs. Some items may interact with your medicine. What should I watch for while using this medicine? Report any side effects that do not go away within 3 days to your doctor or health care professional. Call your health care provider if any unusual symptoms occur within 6 weeks of receiving this vaccine. You may still catch the flu, but the illness is not usually as bad. You cannot get the flu from the vaccine. The vaccine will not protect against colds or other illnesses that may cause fever. The vaccine is needed every year. What side effects may I notice from receiving this medicine? Side effects that you should report to your doctor or health care professional as soon as possible: -allergic reactions like skin rash, itching or hives, swelling of the face, lips, or tongue Side effects that usually do not require medical attention (report to your doctor or health care professional if they continue or are bothersome): -fever -headache -muscle aches and pains -pain, tenderness, redness, or swelling at site where injected -weak or tired This list may not describe all possible side effects. Call your doctor for medical advice about side effects. You  may report side effects to FDA at 1-800-FDA-1088. Where should I keep my medicine? This vaccine is only given in a clinic, pharmacy, doctor's office, or other health care setting and  will not be stored at home. NOTE: This sheet is a summary. It may not cover all possible information. If you have questions about this medicine, talk to your doctor, pharmacist, or health care provider.  2018 Elsevier/Gold Standard (2007-12-21 09:30:40)   Pneumococcal Conjugate Vaccine (PCV13) What You Need to Know 1. Why get vaccinated? Vaccination can protect both children and adults from pneumococcal disease. Pneumococcal disease is caused by bacteria that can spread from person to person through close contact. It can cause ear infections, and it can also lead to more serious infections of the:  Lungs (pneumonia),  Blood (bacteremia), and  Covering of the brain and spinal cord (meningitis).  Pneumococcal pneumonia is most common among adults. Pneumococcal meningitis can cause deafness and brain damage, and it kills about 1 child in 10 who get it. Anyone can get pneumococcal disease, but children under 33 years of age and adults 88 years and older, people with certain medical conditions, and cigarette smokers are at the highest risk. Before there was a vaccine, the [place] saw:  more than 700 cases of meningitis,  about 13,000 blood infections,  about 5 million ear infections, and  about 200 deaths  in children under 5 each year from pneumococcal disease. Since vaccine became available, severe pneumococcal disease in these children has fallen by 88%. About 18,000 older adults die of pneumococcal disease each year in the [place]. Treatment of pneumococcal infections with penicillin and other drugs is not as effective as it used to be, because some strains of the disease have become resistant to these drugs. This makes prevention of the disease, through vaccination, even more important. 2. PCV13 vaccine Pneumococcal conjugate vaccine (called PCV13) protects against 13 types of pneumococcal bacteria. PCV13 is routinely given to children at 2, 4, 6, and 35-43 months of age.  It is also recommended for children and adults 38 to 47 years of age with certain health conditions, and for all adults 62 years of age and older. Your doctor can give you details. 3. Some people should not get this vaccine Anyone who has ever had a life-threatening allergic reaction to a dose of this vaccine, to an earlier pneumococcal vaccine called PCV7, or to any vaccine containing diphtheria toxoid (for example, DTaP), should not get PCV13. Anyone with a severe allergy to any component of PCV13 should not get the vaccine. Tell your doctor if the person being vaccinated has any severe allergies. If the person scheduled for vaccination is not feeling well, your healthcare provider might decide to reschedule the shot on another day. 4. Risks of a vaccine reaction With any medicine, including vaccines, there is a chance of reactions. These are usually mild and go away on their own, but serious reactions are also possible. Problems reported following PCV13 varied by age and dose in the series. The most common problems reported among children were:  About half became drowsy after the shot, had a temporary loss of appetite, or had redness or tenderness where the shot was given.  About 1 out of 3 had swelling where the shot was given.  About 1 out of 3 had a mild fever, and about 1 in 20 had a fever over 102.3F.  Up to about 8 out of 10 became fussy or irritable.  Adults have reported pain,  redness, and swelling where the shot was given; also mild fever, fatigue, headache, chills, or muscle pain. Young children who get PCV13 along with inactivated flu vaccine at the same time may be at increased risk for seizures caused by fever. Ask your doctor for more information. Problems that could happen after any vaccine:  People sometimes faint after a medical procedure, including vaccination. Sitting or lying down for about 15 minutes can help prevent fainting, and injuries caused by a fall. Tell your  doctor if you feel dizzy, or have vision changes or ringing in the ears.  Some older children and adults get severe pain in the shoulder and have difficulty moving the arm where a shot was given. This happens very rarely.  Any medication can cause a severe allergic reaction. Such reactions from a vaccine are very rare, estimated at about 1 in a million doses, and would happen within a few minutes to a few hours after the vaccination. As with any medicine, there is a very small chance of a vaccine causing a serious injury or death. The safety of vaccines is always being monitored. For more information, visit: http://www.aguilar.org/ 5. What if there is a serious reaction? What should I look for? Look for anything that concerns you, such as signs of a severe allergic reaction, very high fever, or unusual behavior. Signs of a severe allergic reaction can include hives, swelling of the face and throat, difficulty breathing, a fast heartbeat, dizziness, and weakness-usually within a few minutes to a few hours after the vaccination. What should I do?  If you think it is a severe allergic reaction or other emergency that can't wait, call 9-1-1 or get the person to the nearest hospital. Otherwise, call your doctor.  Reactions should be reported to the Vaccine Adverse Event Reporting System (VAERS). Your doctor should file this report, or you can do it yourself through the VAERS web site at www.vaers.SamedayNews.es, or by calling (236)274-0651. ? VAERS does not give medical advice. 6. The National Vaccine Injury Compensation Program The [employer] Vaccine Injury Compensation Program (VICP) is a federal program that was created to compensate people who may have been injured by certain vaccines. Persons who believe they may have been injured by a vaccine can learn about the program and about filing a claim by calling (270)055-8205 or visiting the [address] website at GoldCloset.com.ee. There is a time  limit to file a claim for compensation. 7. How can I learn more?  Ask your healthcare provider. He or she can give you the vaccine package insert or suggest other sources of information.  Call your local or state health department.  Contact the Centers for Disease Control and Prevention (CDC): ? Call (719)446-7995 (1-800-CDC-INFO) or ? Visit CDC's website at http://hunter.com/ Vaccine Information Statement, PCV13 Vaccine (04/12/2014) This information is not intended to replace advice given to you by your health care provider. Make sure you discuss any questions you have with your health care provider. Document Released: 03/22/2006 Document Revised: 02/13/2016 Document Reviewed: 02/13/2016 Elsevier Interactive Patient Education  2017 [employer].

## 2017-02-26 ENCOUNTER — Other Ambulatory Visit: Payer: Self-pay | Admitting: Family Medicine

## 2017-02-26 DIAGNOSIS — E1121 Type 2 diabetes mellitus with diabetic nephropathy: Secondary | ICD-10-CM

## 2017-03-08 MED FILL — BIDIL TABLET: 20-37.5 | 30 days supply | Qty: 90 | Fill #3

## 2017-04-07 ENCOUNTER — Ambulatory Visit (INDEPENDENT_AMBULATORY_CARE_PROVIDER_SITE_OTHER): Payer: Medicare HMO | Admitting: Physician Assistant

## 2017-04-07 ENCOUNTER — Encounter: Payer: Self-pay | Admitting: Physician Assistant

## 2017-04-07 ENCOUNTER — Telehealth: Payer: Self-pay | Admitting: Internal Medicine

## 2017-04-07 VITALS — BP 115/68 | HR 75 | Ht 70.0 in | Wt 236.0 lb

## 2017-04-07 DIAGNOSIS — Z79899 Other long term (current) drug therapy: Secondary | ICD-10-CM | POA: Diagnosis not present

## 2017-04-07 DIAGNOSIS — E785 Hyperlipidemia, unspecified: Secondary | ICD-10-CM

## 2017-04-07 DIAGNOSIS — R05 Cough: Secondary | ICD-10-CM

## 2017-04-07 DIAGNOSIS — I10 Essential (primary) hypertension: Secondary | ICD-10-CM | POA: Diagnosis not present

## 2017-04-07 DIAGNOSIS — I2510 Atherosclerotic heart disease of native coronary artery without angina pectoris: Secondary | ICD-10-CM

## 2017-04-07 DIAGNOSIS — R053 Chronic cough: Secondary | ICD-10-CM

## 2017-04-07 DIAGNOSIS — E119 Type 2 diabetes mellitus without complications: Secondary | ICD-10-CM | POA: Diagnosis not present

## 2017-04-07 DIAGNOSIS — G5601 Carpal tunnel syndrome, right upper limb: Secondary | ICD-10-CM

## 2017-04-07 NOTE — Progress Notes (Signed)
Cardiology Office Note    Date:  04/09/2017   ID:  Sanchez, Desiree 1950/12/01, MRN [id number]  PCP:  Ladell Pier, MD  Cardiologist:  Dr. Martinique  Chief Complaint  Patient presents with  . Follow-up    seen for Dr. Martinique    History of Present Illness:  Desiree Sanchez is a 66 y.o. female with PMH of HTN, DM II, prior tobacco abuse and CAD. She presented to the hospital on 09/04/2016 with chest pain. She has a family history of CAD as well with her mother died of MI at age 25. Initial point-of-care troponin was negative, however troponin I was 0.2, unclear if elevation was due to high blood pressure or coronary artery disease. Echocardiogram obtained on 09/05/2016 showed EF 32-95%, grade 1 diastolic dysfunction, PA peak pressure 36 mmHg. Myoview obtained on 09/06/2016 came back low risk study, EF 55-65%, however it cannot rule out inferior MI versus diaphragmatic attenuation with small area of apical ischemia. Patient eventually underwent cardiac catheterization on 09/07/2016, this showed nonobstructive CAD with only 40% distal LAD stenosis, 30% OM1 lesion, 60% distal RCA with negative FFR of 0.94, questionable myocardial bridging with 40% distal LAD lesion. Medical therapy was recommended. Carvedilol was increased to 25 mg twice a day to maximize heart rate and blood pressure control. Lipid panel obtained on 09/05/2016 showed low HDL, otherwise well-controlled total cholesterol, triglycerides and LDL level. Lipitor 10 mg was added prior to discharge given the mild coronary artery disease.   I last saw the patient on 09/25/2016, she was doing well at the time. She presents today for cardiology office visit. She denies any significant chest pain or shortness breath. She has been having an intermittent dry cough that occurring on a daily basis for the past several months. On physical exam, her lung is clear. She is not on any medication that is capable causing her dry cough either. Otherwise from  the cardiology perspective she is doing quite well. She can follow-up with Dr. Martinique in 6 months. She will need a fasting lipid panel and LFTs prior to that. She is not fasting this morning.   Past Medical History:  Diagnosis Date  . Arthritis Dx 2006  . Carpal tunnel syndrome   . Diabetes mellitus without complication ([place]) Dx 1884  . Dyspnea   . Gout   . Heart murmur   . Hypertension Dx 2005  . Unstable angina ([place]) 08/2016    Past Surgical History:  Procedure Laterality Date  . APPENDECTOMY    . INTRAVASCULAR PRESSURE WIRE/FFR STUDY N/A 09/07/2016   Procedure: Intravascular Pressure Wire/FFR Study;  Surgeon: Nelva Bush, MD;  Location: [facility];  Service: Cardiovascular;  Laterality: N/A;  . LEFT HEART CATH AND CORONARY ANGIOGRAPHY N/A 09/07/2016   Procedure: Left Heart Cath and Coronary Angiography;  Surgeon: Nelva Bush, MD;  Location: [facility];  Service: Cardiovascular;  Laterality: N/A;  . TUBAL LIGATION      Current Medications: Outpatient Medications Prior to Visit  Medication Sig Dispense Refill  . ACCU-CHEK SOFTCLIX LANCETS lancets 1 each by Other route daily. ICD 10 E11.21 100 each 12  . aspirin EC 81 MG tablet Take 1 tablet (81 mg total) by mouth daily. 90 tablet 3  . atorvastatin (LIPITOR) 10 MG tablet Take 1 tablet (10 mg total) by mouth daily. 90 tablet 3  . Blood Glucose Monitoring Suppl (ACCU-CHEK AVIVA PLUS) w/Device KIT 1 Device by Does not apply route daily. ICD  10 E11.21 1 kit 0  . Blood Pressure KIT 1 each by Does not apply route daily. 1 each 0  . carvedilol (COREG) 25 MG tablet Take 1 tablet (25 mg total) by mouth 2 (two) times daily with a meal. 180 tablet 0  . colchicine 0.6 MG tablet Take 1 tablet (0.6 mg total) by mouth 2 (two) times daily as needed (gout flare). 180 tablet 0  . diclofenac sodium (VOLTAREN) 1 % GEL Apply 2 g topically 4 (four) times daily. 100 g 2  . gabapentin (NEURONTIN) 100 MG capsule TAKE 3 CAPSULES AT  BEDTIME 270 capsule 0  . glucose blood (ACCU-CHEK AVIVA PLUS) test strip 1 each by Other route daily. ICD 10 E11.21 100 each 12  . isosorbide-hydrALAZINE (BIDIL) 20-37.5 MG tablet Take 1 tablet by mouth 3 (three) times daily. 90 tablet 11  . metFORMIN (GLUCOPHAGE-XR) 500 MG 24 hr tablet TAKE 2 TABLETS EVERY DAY  AFTER  SUPPER 180 tablet 0  . traMADol (ULTRAM) 50 MG tablet Take 1 tablet (50 mg total) by mouth every 8 (eight) hours as needed for moderate pain. 90 tablet 0  . Triamcinolone Acetonide (TRIAMCINOLONE 0.1 % CREAM : EUCERIN) CREA Apply 1 application topically 2 (two) times daily as needed. Please mix 1:1 (Patient taking differently: Apply 1 application topically 2 (two) times daily as needed. Please mix 1:1) 1 each 3  . triamcinolone cream (KENALOG) 0.1 % Apply 1 application topically 2 (two) times daily. 30 g 0  . zolpidem (AMBIEN) 5 MG tablet Take 1 tablet (5 mg total) by mouth at bedtime as needed for sleep. 30 tablet 1   No facility-administered medications prior to visit.      Allergies:   Lisinopril; Hctz [hydrochlorothiazide]; Naproxen; and Norvasc [amlodipine besylate]   Social History   Social History  . Marital status: Single    Spouse name: N/A  . Number of children: N/A  . Years of education: N/A   Social History Main Topics  . Smoking status: Former Research scientist (life sciences)  . Smokeless tobacco: Never Used     Comment: QUIT SMOKING IN 2009  . Alcohol use Yes     Comment: occasionally  . Drug use: No  . Sexual activity: Not Asked   Other Topics Concern  . None   Social History Narrative  . None     Family History:  The patient's family history includes Diabetes in her mother and sister.   ROS:   Please see the history of present illness.    ROS All other systems reviewed and are negative.   PHYSICAL EXAM:   VS:  BP 115/68   Pulse 75   Ht 5' 10"  (1.778 m)   Wt 236 lb (107 kg)   BMI 33.86 kg/m    GEN: Well nourished, well developed, in no acute distress  HEENT:  normal  Neck: no JVD, carotid bruits, or masses Cardiac: RRR; no murmurs, rubs, or gallops,no edema  Respiratory:  clear to auscultation bilaterally, normal work of breathing GI: soft, nontender, nondistended, + BS MS: no deformity or atrophy  Skin: warm and dry, no rash Neuro:  Alert and Oriented x 3, Strength and sensation are intact Psych: euthymic mood, full affect  Wt Readings from Last 3 Encounters:  04/07/17 236 lb (107 kg)  02/16/17 231 lb 6.4 oz (105 kg)  11/06/16 224 lb 12.8 oz (102 kg)      Studies/Labs Reviewed:   EKG:  EKG is not ordered today.    Recent  Labs: 09/14/2016: Hemoglobin 11.0; Magnesium 1.8; Platelets 305 11/06/2016: BUN 21; Creatinine, Ser 1.26; Potassium 4.5; Sodium 143   Lipid Panel    Component Value Date/Time   CHOL 108 09/05/2016 0228   TRIG 111 09/05/2016 0228   HDL 31 (L) 09/05/2016 0228   CHOLHDL 3.5 09/05/2016 0228   VLDL 22 09/05/2016 0228   LDLCALC 55 09/05/2016 0228    Additional studies/ records that were reviewed today include:   Echo 09/05/2016 LV EF: 50% - 55%  - Left ventricle: Distal septal hypokinesis The cavity size was mildly dilated. Wall thickness was normal. Systolic function was normal. The estimated ejection fraction was in the range of 50% to 55%. Wall motion was normal; there were no regional wall motion abnormalities. Doppler parameters are consistent with abnormal left ventricular relaxation (grade 1 diastolic dysfunction). - Atrial septum: No defect or patent foramen ovale was identified. - Pulmonary arteries: PA peak pressure: 36 mm Hg (S).   Myoview 09/06/2016  There was no ST segment deviation noted during stress.  T wave inversion was noted during stress in the V3, V4, V5 and V6 leads.  Findings consistent with ischemia and prior myocardial infarction.  This is a low risk study.  The left ventricular ejection fraction is normal (55-65%).  Poor quality study with extracardiac  activity and diaphragmatic attenuation Cannot r/o inferior MI vs diaphragmatic attenuation small area of apical ischemia EF 57% with apical hypokinesis    Cath 09/07/2016 Conclusion   Conclusions: 1. Non-obstructive coronary artery disease, including 40% distal LAD stenosis, 30% OM1 lesion, and 60% distal RCA stenosis (FFR 0.94). Question degree of myocardial bridging at 40% distal LAD lesion. 2. Normal left ventricular filling pressure. 3. Systemic hypertension.  Recommendation: 1. Medical therapy; will increase carvedilol to 25 mg BID to maximize heart rate and blood pressure control. 2. Risk factor modification and medical therapy to prevent progression of CAD.      ASSESSMENT:    1. Coronary artery disease involving native coronary artery of native heart without angina pectoris   2. Dyslipidemia   3. Medication management   4. Essential hypertension   5. Controlled type 2 diabetes mellitus without complication, without long-term current use of insulin ([place])   6. Chronic cough      PLAN:  In order of problems listed above:  1. CAD: Denies any angina. On medical therapy for moderate CAD seen on previous cardiac catheterization in April 2018.  2. Hyperlipidemia: On Lipitor 10 mg daily, previous lipid panel obtained in March 2018 showed LDL 55, cholesterol 108, triglyceride 111, HDL 31. She will need a repeat fasting lipid panel and LFTs in 6 months prior to her next office visit.  3. Hypertension: Blood pressure very well controlled.  4. DM 2: Managed by primary care provider. Continue metformin.  5. Chronic cough: Unclear cause, has been ongoing for the past several months. Her lungs is clear, there is no wheezing, rhonchi, or crackle.    Medication Adjustments/Labs and Tests Ordered: Current medicines are reviewed at length with the patient today.  Concerns regarding medicines are outlined above.  Medication changes, Labs and Tests ordered today are listed in the  Patient Instructions below. Patient Instructions  Medication Instructions: Almyra Deforest, PA-C recommends that you continue on your current medications as directed. Please refer to the Current Medication list given to you today.  Labwork: Your physician recommends that you return for lab work at your earliest [place].  Testing/Procedures: NONE ORDERED  Follow-up: Isaac Laud recommends  that you schedule a follow-up appointment in 6 months with Dr Martinique. You will receive a reminder letter in the mail two months in advance. If you don't receive a letter, please call our office to schedule the follow-up appointment.  If you need a refill on your cardiac medications before your next appointment, please call your pharmacy.    Hilbert Corrigan, [place]  04/09/2017 4:39 AM    [address], [place], [place]  [address] Phone: [phone]; Fax: [phone]

## 2017-04-07 NOTE — Telephone Encounter (Signed)
Patient needs a refill on tramdol and Indocin please fu with patient

## 2017-04-07 NOTE — Telephone Encounter (Signed)
I cannot refill tramadol and indomethacin was supposed to be d/ced in June. Will forward to Dr. Wynetta Emery.

## 2017-04-07 NOTE — Patient Instructions (Signed)
Medication Instructions: Almyra Deforest, PA-C recommends that you continue on your current medications as directed. Please refer to the Current Medication list given to you today.  Labwork: Your physician recommends that you return for lab work at your earliest [place].  Testing/Procedures: NONE ORDERED  Follow-up: Isaac Laud recommends that you schedule a follow-up appointment in 6 months with Dr Martinique. You will receive a reminder letter in the mail two months in advance. If you don't receive a letter, please call our office to schedule the follow-up appointment.  If you need a refill on your cardiac medications before your next appointment, please call your pharmacy.

## 2017-04-09 ENCOUNTER — Encounter: Payer: Self-pay | Admitting: Physician Assistant

## 2017-04-09 ENCOUNTER — Telehealth: Payer: Self-pay | Admitting: Internal Medicine

## 2017-04-09 MED ORDER — TRAMADOL HCL 50 MG PO TABS: 50.0000 mg | ORAL_TABLET | Freq: Two times a day (BID) | ORAL | 0 refills | Status: DC | PRN

## 2017-04-09 NOTE — Telephone Encounter (Signed)
Pt states the voltaren gel is not helping in places where the gout flares up. Pt states she was prescribed Tramadol because of the leg and hip pain she was having. Please f/u

## 2017-04-09 NOTE — Telephone Encounter (Signed)
Pt is aware of rx and will be put up front

## 2017-04-09 NOTE — Addendum Note (Signed)
Addended by: Karle Plumber B on: 04/09/2017 03:09 PM   Modules accepted: Orders

## 2017-04-13 MED FILL — traMADol HCL 50 MG TABS: 50 | 30 days supply | Qty: 60 | Fill #0

## 2017-04-13 MED FILL — BIDIL TABLET: 20-37.5 | 30 days supply | Qty: 90 | Fill #4

## 2017-05-10 ENCOUNTER — Ambulatory Visit: Payer: Medicare HMO | Admitting: Internal Medicine

## 2017-06-14 ENCOUNTER — Encounter: Payer: Self-pay | Admitting: Internal Medicine

## 2017-06-14 ENCOUNTER — Ambulatory Visit: Payer: Medicare HMO | Attending: Internal Medicine | Admitting: Internal Medicine

## 2017-06-14 VITALS — BP 161/78 | HR 61 | Temp 98.1°F | Resp 16 | Wt 242.0 lb

## 2017-06-14 DIAGNOSIS — L729 Follicular cyst of the skin and subcutaneous tissue, unspecified: Secondary | ICD-10-CM

## 2017-06-14 DIAGNOSIS — E1122 Type 2 diabetes mellitus with diabetic chronic kidney disease: Secondary | ICD-10-CM | POA: Diagnosis not present

## 2017-06-14 DIAGNOSIS — Z79899 Other long term (current) drug therapy: Secondary | ICD-10-CM | POA: Diagnosis not present

## 2017-06-14 DIAGNOSIS — G8929 Other chronic pain: Secondary | ICD-10-CM | POA: Insufficient documentation

## 2017-06-14 DIAGNOSIS — Z8739 Personal history of other diseases of the musculoskeletal system and connective tissue: Secondary | ICD-10-CM | POA: Diagnosis not present

## 2017-06-14 DIAGNOSIS — Z87891 Personal history of nicotine dependence: Secondary | ICD-10-CM | POA: Insufficient documentation

## 2017-06-14 DIAGNOSIS — Z79891 Long term (current) use of opiate analgesic: Secondary | ICD-10-CM | POA: Insufficient documentation

## 2017-06-14 DIAGNOSIS — E118 Type 2 diabetes mellitus with unspecified complications: Secondary | ICD-10-CM | POA: Diagnosis not present

## 2017-06-14 DIAGNOSIS — I10 Essential (primary) hypertension: Secondary | ICD-10-CM

## 2017-06-14 DIAGNOSIS — Z7982 Long term (current) use of aspirin: Secondary | ICD-10-CM | POA: Insufficient documentation

## 2017-06-14 DIAGNOSIS — N183 Chronic kidney disease, stage 3 unspecified: Secondary | ICD-10-CM

## 2017-06-14 DIAGNOSIS — R05 Cough: Secondary | ICD-10-CM | POA: Insufficient documentation

## 2017-06-14 DIAGNOSIS — M25512 Pain in left shoulder: Secondary | ICD-10-CM | POA: Insufficient documentation

## 2017-06-14 DIAGNOSIS — R059 Cough, unspecified: Secondary | ICD-10-CM

## 2017-06-14 DIAGNOSIS — I129 Hypertensive chronic kidney disease with stage 1 through stage 4 chronic kidney disease, or unspecified chronic kidney disease: Secondary | ICD-10-CM | POA: Diagnosis not present

## 2017-06-14 DIAGNOSIS — Z7984 Long term (current) use of oral hypoglycemic drugs: Secondary | ICD-10-CM | POA: Insufficient documentation

## 2017-06-14 LAB — GLUCOSE, POCT (MANUAL RESULT ENTRY): POC Glucose: 89 mg/dl (ref 70–99)

## 2017-06-14 MED ORDER — DICLOFENAC SODIUM 1 % TD GEL: 2.0000 g | Freq: Four times a day (QID) | TRANSDERMAL | 2 refills | Status: DC

## 2017-06-14 MED ORDER — LORATADINE 10 MG PO TABS: 10.0000 mg | ORAL_TABLET | Freq: Every day | ORAL | 2 refills | Status: DC

## 2017-06-14 MED ORDER — INDOMETHACIN 50 MG PO CAPS: 50.0000 mg | ORAL_CAPSULE | Freq: Two times a day (BID) | ORAL | 1 refills | Status: DC | PRN

## 2017-06-14 MED ORDER — ALBUTEROL SULFATE HFA 108 (90 BASE) MCG/ACT IN AERS: 2.0000 | INHALATION_SPRAY | Freq: Four times a day (QID) | RESPIRATORY_TRACT | 2 refills | Status: DC | PRN

## 2017-06-14 MED FILL — BIDIL TABLET: 20-37.5 | 30 days supply | Qty: 90 | Fill #5

## 2017-06-14 MED FILL — VENTOLIN HFA 90 MCG INHALER: 108 (90 BAS | 25 days supply | Qty: 18 | Fill #0

## 2017-06-14 MED FILL — CARVEDILOL 25 MG TABLET: 25 | 90 days supply | Qty: 180 | Fill #1

## 2017-06-14 NOTE — Progress Notes (Signed)
Pt states she is requesting a refill on her voltaren gel and indocin

## 2017-06-14 NOTE — Progress Notes (Signed)
Patient ID: ISMERAI BIN, female    DOB: 1951/06/03  MRN: [id number]  CC: Bronchitis   Subjective: Desiree Sanchez is a 67 y.o. female who presents for chronic ds management. 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 C/o "bronchitis attack" every winter.  Had cough and congestion since christmas. Took OTC cough med and Mucinex. Congestion is gone but still has cough -no fever. SOB at nights and if she has to walk a distance. Wheezing at times -some drainage at back of throat. Heart burn at times but not regularly -brother takes Singulair at nights and told her he has never had bronchitis since being on it. She would like to be prescribed the same  2. Gout: Request RF on Indocin to take when needed for gout flare  3.  DM: checks BS daily. Range 80-100 -doing well with eating habits -walking on treadmill and swim 2-3 x a wk  4.  Shoulder pain:  Better.  "I have not had any problems with it."  Request RF on Voltaren gel  5.  HTN:  Compliant with meds and salt restriction  6.  Has a growth on scalp since childhood; it has grown some. Sisters have the same. Does not bother her except cosmetically. Would like to cut hair short.  ? About whether it is worth having removed. Patient Active Problem List   Diagnosis Date Noted  . CAD (coronary artery disease) 02/08/2017  . Insomnia 11/06/2016  . Iron deficiency anemia 09/14/2016  . Shoulder pain, left 02/11/2016  . Right carpal tunnel syndrome 07/29/2014  . Vitamin D insufficiency 04/13/2014  . Obesity (BMI 30-39.9) 03/13/2013  . Gout 05/20/2009  . HEART MURMUR, SYSTOLIC 30/12/6224  . DM2 (diabetes mellitus, type 2) ([place]) 04/22/2009  . Essential hypertension 04/22/2009     Current Outpatient Medications on File Prior to Visit  Medication Sig Dispense Refill  . ACCU-CHEK SOFTCLIX LANCETS  lancets 1 each by Other route daily. ICD 10 E11.21 100 each 12  . aspirin EC 81 MG tablet Take 1 tablet (81 mg total) by mouth daily. 90 tablet 3  . atorvastatin (LIPITOR) 10 MG tablet Take 1 tablet (10 mg total) by mouth daily. 90 tablet 3  . Blood Glucose Monitoring Suppl (ACCU-CHEK AVIVA PLUS) w/Device KIT 1 Device by Does not apply route daily. ICD 10 E11.21 1 kit 0  . Blood Pressure KIT 1 each by Does not apply route daily. 1 each 0  . carvedilol (COREG) 25 MG tablet Take 1 tablet (25 mg total) by mouth 2 (two) times daily with a meal. 180 tablet 0  . colchicine 0.6 MG tablet Take 1 tablet (0.6 mg total) by mouth 2 (two) times daily as needed (gout flare). 180 tablet 0  . gabapentin (NEURONTIN) 100 MG capsule TAKE 3 CAPSULES AT BEDTIME 270 capsule 0  . glucose blood (ACCU-CHEK AVIVA PLUS) test strip 1 each by Other route daily. ICD 10 E11.21 100 each 12  . isosorbide-hydrALAZINE (BIDIL) 20-37.5 MG tablet Take 1 tablet by mouth 3 (three) times daily. 90 tablet 11  . metFORMIN (GLUCOPHAGE-XR) 500 MG 24 hr tablet TAKE 2 TABLETS EVERY DAY  AFTER  SUPPER 180 tablet 0  . traMADol (ULTRAM) 50 MG tablet Take 1 tablet (50 mg total) by mouth 2 (two) times daily as needed for moderate pain. 60 tablet 0  .  Triamcinolone Acetonide (TRIAMCINOLONE 0.1 % CREAM : EUCERIN) CREA Apply 1 application topically 2 (two) times daily as needed. Please mix 1:1 (Patient taking differently: Apply 1 application topically 2 (two) times daily as needed. Please mix 1:1) 1 each 3  . triamcinolone cream (KENALOG) 0.1 % Apply 1 application topically 2 (two) times daily. 30 g 0  . zolpidem (AMBIEN) 5 MG tablet Take 1 tablet (5 mg total) by mouth at bedtime as needed for sleep. 30 tablet 1   No current facility-administered medications on file prior to visit.     Allergies  Allergen Reactions  . Lisinopril Swelling    REACTION: Swolen lips (only)  . Hctz [Hydrochlorothiazide] Other (See Comments)    Joint pain   . Naproxen  Itching and Palpitations  . Norvasc [Amlodipine Besylate] Other (See Comments)    Peripheral edema     Social History   Socioeconomic History  . Marital status: Single    Spouse name: Not on file  . Number of children: Not on file  . Years of education: Not on file  . Highest education level: Not on file  Social Needs  . Financial resource strain: Not on file  . Food insecurity - worry: Not on file  . Food insecurity - inability: Not on file  . Transportation needs - medical: Not on file  . Transportation needs - non-medical: Not on file  Occupational History  . Not on file  Tobacco Use  . Smoking status: Former Research scientist (life sciences)  . Smokeless tobacco: Never Used  . Tobacco comment: QUIT SMOKING IN 2009  Substance and Sexual Activity  . Alcohol use: Yes    Comment: occasionally  . Drug use: No  . Sexual activity: Not on file  Other Topics Concern  . Not on file  Social History Narrative  . Not on file    Family History  Problem Relation Age of Onset  . Diabetes Mother   . Diabetes Sister     Past Surgical History:  Procedure Laterality Date  . APPENDECTOMY    . INTRAVASCULAR PRESSURE WIRE/FFR STUDY N/A 09/07/2016   Procedure: Intravascular Pressure Wire/FFR Study;  Surgeon: Nelva Bush, MD;  Location: [facility];  Service: Cardiovascular;  Laterality: N/A;  . LEFT HEART CATH AND CORONARY ANGIOGRAPHY N/A 09/07/2016   Procedure: Left Heart Cath and Coronary Angiography;  Surgeon: Nelva Bush, MD;  Location: [facility];  Service: Cardiovascular;  Laterality: N/A;  . TUBAL LIGATION      ROS: Review of Systems Neg except as above PHYSICAL EXAM: BP (!) 161/78   Pulse 61   Temp 98.1 F (36.7 C) (Oral)   Resp 16   Wt 242 lb (109.8 kg)   SpO2 100%   BMI 34.72 kg/m   BP 150/70 Physical Exam  General appearance - alert, well appearing, and in no distress Mental status - alert, oriented to person, place, and time, normal mood, behavior, speech, dress,  motor activity, and thought processes Nose - normal and patent, no erythema, discharge or polyps Mouth - mucous membranes moist, pharynx normal without lesions Neck - supple, no significant adenopathy Chest - clear to auscultation, no wheezes, rales or rhonchi, symmetric air entry Heart - normal rate, regular rhythm, normal S1, S2, no murmurs, rubs, clicks or gallops Extremities - peripheral pulses normal, no pedal edema, no clubbing or cyanosis Skin - raised gulf ball size, soft mass on crown     Chemistry      Component Value Date/Time  NA 143 11/06/2016 1050   K 4.5 11/06/2016 1050   CL 108 (H) 11/06/2016 1050   CO2 21 11/06/2016 1050   BUN 21 11/06/2016 1050   CREATININE 1.26 (H) 11/06/2016 1050   CREATININE 1.19 (H) 08/03/2016 1529      Component Value Date/Time   CALCIUM 9.5 11/06/2016 1050   ALKPHOS 83 01/03/2015 1149   AST 15 01/03/2015 1149   ALT 10 01/03/2015 1149   BILITOT 0.4 01/03/2015 1149     Lab Results  Component Value Date   WBC 7.2 09/14/2016   HGB 11.0 (L) 09/14/2016   HCT 34.0 09/14/2016   MCV 88 09/14/2016   PLT 305 09/14/2016   Results for orders placed or performed in visit on 06/14/17  POCT glucose (manual entry)  Result Value Ref Range   POC Glucose 89 70 - 99 mg/dl     ASSESSMENT AND PLAN: 1. Cough Likely cough post upper respiratory infection which can last several wks +/- postnasal drip.  I think watchful waiting with some loratadine would be appropriate.  However the patient felt that she needed an inhaler because of wheezing that she sometimes notices.  I gave her albuterol.  She wanted to know if there was some other inhaler because she felt albuterol did not work for her in the past.  Patient informed that this is the inhaler that we usually start with. -I do not think she needs Singulair and patient was not happy about this. - albuterol (PROVENTIL HFA;VENTOLIN HFA) 108 (90 Base) MCG/ACT inhaler; Inhale 2 puffs into the lungs every 6  (six) hours as needed for wheezing or shortness of breath.  Dispense: 1 Inhaler; Refill: 2 - loratadine (CLARITIN) 10 MG tablet; Take 1 tablet (10 mg total) by mouth daily.  Dispense: 30 tablet; Refill: 2  2. Controlled type 2 diabetes mellitus with complication, without long-term current use of insulin (HCC) At goal.  Continue current medication - POCT glucose (manual entry)  3. Essential hypertension Not at goal.  Blood pressure in the past have been under pretty good control.  We will hold off adding any new medication and have her come as a nurse only blood pressure check in about 1 month.  If still elevated then we will need to add another blood pressure medication.  4. Scalp cyst - Ambulatory referral to Dermatology  5. History of gout Patient advised that we need to be careful with use of Indocin given her kidney function.  Patient reports that she does not use it every day and takes it only for a day or 2 when she feels a gout attack coming on. - indomethacin (INDOCIN) 50 MG capsule; Take 1 capsule (50 mg total) by mouth 2 (two) times daily as needed (for acute gout attacks.).  Dispense: 30 capsule; Refill: 1  6. CKD (chronic kidney disease) stage 3, GFR 30-59 ml/min (HCC) C #5 above  7. Chronic left shoulder pain - diclofenac sodium (VOLTAREN) 1 % GEL; Apply 2 g topically 4 (four) times daily.  Dispense: 100 g; Refill: 2  Patient was given the opportunity to ask questions.  Patient verbalized understanding of the plan and was able to repeat key elements of the plan.   Orders Placed This Encounter  Procedures  . Ambulatory referral to Dermatology  . POCT glucose (manual entry)     Requested Prescriptions   Signed Prescriptions Disp Refills  . albuterol (PROVENTIL HFA;VENTOLIN HFA) 108 (90 Base) MCG/ACT inhaler 1 Inhaler 2    Sig:  Inhale 2 puffs into the lungs every 6 (six) hours as needed for wheezing or shortness of breath.  . loratadine (CLARITIN) 10 MG tablet 30 tablet  2    Sig: Take 1 tablet (10 mg total) by mouth daily.  . diclofenac sodium (VOLTAREN) 1 % GEL 100 g 2    Sig: Apply 2 g topically 4 (four) times daily.  . indomethacin (INDOCIN) 50 MG capsule 30 capsule 1    Sig: Take 1 capsule (50 mg total) by mouth 2 (two) times daily as needed (for acute gout attacks.).    Return in about 4 months (around 10/12/2017).  Karle Plumber, MD, FACP

## 2017-06-14 NOTE — Patient Instructions (Addendum)
Give appointment in 1 month for nurse only BP check.    Use the Albuterol inhaler and Loratidine as needed.  You have been referred to a dermatologist.

## 2017-06-15 ENCOUNTER — Encounter: Payer: Self-pay | Admitting: Pharmacist

## 2017-06-15 NOTE — Progress Notes (Signed)
PA submitted for indomethacin. Pending approval by [facility].

## 2017-06-17 MED FILL — INDOMETHACIN 50 MG CAPSULE: 50 | 15 days supply | Qty: 30 | Fill #0

## 2017-06-18 ENCOUNTER — Ambulatory Visit: Payer: Medicare HMO | Admitting: Internal Medicine

## 2017-07-15 ENCOUNTER — Encounter: Payer: Self-pay | Admitting: Pharmacist

## 2017-07-15 ENCOUNTER — Ambulatory Visit: Payer: Medicare HMO | Attending: Internal Medicine | Admitting: Pharmacist

## 2017-07-15 VITALS — BP 135/76 | HR 72

## 2017-07-15 DIAGNOSIS — I10 Essential (primary) hypertension: Secondary | ICD-10-CM

## 2017-07-15 DIAGNOSIS — Z79899 Other long term (current) drug therapy: Secondary | ICD-10-CM | POA: Insufficient documentation

## 2017-07-15 MED FILL — BIDIL TABLET: 20-37.5 | 30 days supply | Qty: 90 | Fill #6

## 2017-07-15 NOTE — Patient Instructions (Addendum)
Thanks for coming to see [place]!  Continue current medications  Check blood pressure at home or at a pharmacy, your goal is <140/90  Follow up with Dr. Wynetta Emery as directed

## 2017-07-15 NOTE — Progress Notes (Signed)
   S:    Patient arrives in good spirits.    Presents to the clinic for hypertension evaluation. Patient was referred on 06/14/17.  Patient was last seen by Primary Care Provider on 06/14/17.   Patient denies adherence with medications.  Current BP Medications include:  Hydralazine-isosorbide 20-37.5 mg TID, carvedilol 25 mg BID  Previous BP Medications include:  Lisinopril (lip swelling), HCTZ (joint pain), and amlodipine (lower extremity swelling).   O:   Last 3 Office BP readings: BP Readings from Last 3 Encounters:  07/15/17 135/76  06/14/17 (!) 161/78  04/07/17 115/68    BMET    Component Value Date/Time   NA 143 11/06/2016 1050   K 4.5 11/06/2016 1050   CL 108 (H) 11/06/2016 1050   CO2 21 11/06/2016 1050   GLUCOSE 188 (H) 11/06/2016 1050   GLUCOSE 98 09/08/2016 0252   BUN 21 11/06/2016 1050   CREATININE 1.26 (H) 11/06/2016 1050   CREATININE 1.19 (H) 08/03/2016 1529   CALCIUM 9.5 11/06/2016 1050   GFRNONAA 44 (L) 11/06/2016 1050   GFRNONAA 47 (L) 08/03/2016 1529   GFRAA 51 (L) 11/06/2016 1050   GFRAA 55 (L) 08/03/2016 1529    A/P: Hypertension longstanding currently controlled on current medications.  Continued current medications. Patient to monitor blood pressure at home or local pharmacy and will let us know if she has readings >140/90.   Results reviewed and written information provided.   Total time in face-to-face counseling 5 minutes.   F/U Clinic Visit with Dr. Wynetta Emery as directed.  Patient seen with Reino Bellis, PharmD Candidate

## 2017-08-25 MED FILL — BIDIL TABLET: 20-37.5 | 30 days supply | Qty: 90 | Fill #7

## 2017-08-31 MED FILL — CARVEDILOL 25 MG TABS: 25 | 90 days supply | Qty: 180 | Fill #0

## 2017-10-04 MED FILL — BIDIL TABLET: 20-37.5 | 30 days supply | Qty: 90 | Fill #8

## 2017-10-13 ENCOUNTER — Ambulatory Visit: Payer: Medicare HMO | Admitting: Internal Medicine

## 2017-10-29 ENCOUNTER — Encounter: Payer: Self-pay | Admitting: Internal Medicine

## 2017-10-29 ENCOUNTER — Ambulatory Visit: Payer: Medicare HMO | Attending: Internal Medicine | Admitting: Internal Medicine

## 2017-10-29 VITALS — BP 123/70 | HR 73 | Temp 98.3°F | Resp 16 | Wt 249.2 lb

## 2017-10-29 DIAGNOSIS — Z833 Family history of diabetes mellitus: Secondary | ICD-10-CM | POA: Diagnosis not present

## 2017-10-29 DIAGNOSIS — G5601 Carpal tunnel syndrome, right upper limb: Secondary | ICD-10-CM | POA: Diagnosis not present

## 2017-10-29 DIAGNOSIS — I2510 Atherosclerotic heart disease of native coronary artery without angina pectoris: Secondary | ICD-10-CM | POA: Diagnosis not present

## 2017-10-29 DIAGNOSIS — F5101 Primary insomnia: Secondary | ICD-10-CM

## 2017-10-29 DIAGNOSIS — E1122 Type 2 diabetes mellitus with diabetic chronic kidney disease: Secondary | ICD-10-CM | POA: Diagnosis not present

## 2017-10-29 DIAGNOSIS — E118 Type 2 diabetes mellitus with unspecified complications: Secondary | ICD-10-CM | POA: Diagnosis not present

## 2017-10-29 DIAGNOSIS — Z7984 Long term (current) use of oral hypoglycemic drugs: Secondary | ICD-10-CM | POA: Diagnosis not present

## 2017-10-29 DIAGNOSIS — I129 Hypertensive chronic kidney disease with stage 1 through stage 4 chronic kidney disease, or unspecified chronic kidney disease: Secondary | ICD-10-CM | POA: Insufficient documentation

## 2017-10-29 DIAGNOSIS — Z8739 Personal history of other diseases of the musculoskeletal system and connective tissue: Secondary | ICD-10-CM | POA: Diagnosis not present

## 2017-10-29 DIAGNOSIS — M25512 Pain in left shoulder: Secondary | ICD-10-CM | POA: Diagnosis not present

## 2017-10-29 DIAGNOSIS — I10 Essential (primary) hypertension: Secondary | ICD-10-CM | POA: Diagnosis not present

## 2017-10-29 DIAGNOSIS — D509 Iron deficiency anemia, unspecified: Secondary | ICD-10-CM | POA: Insufficient documentation

## 2017-10-29 DIAGNOSIS — G8929 Other chronic pain: Secondary | ICD-10-CM

## 2017-10-29 DIAGNOSIS — Z888 Allergy status to other drugs, medicaments and biological substances status: Secondary | ICD-10-CM | POA: Diagnosis not present

## 2017-10-29 DIAGNOSIS — N183 Chronic kidney disease, stage 3 (moderate): Secondary | ICD-10-CM | POA: Insufficient documentation

## 2017-10-29 DIAGNOSIS — Z886 Allergy status to analgesic agent status: Secondary | ICD-10-CM | POA: Insufficient documentation

## 2017-10-29 DIAGNOSIS — G4700 Insomnia, unspecified: Secondary | ICD-10-CM

## 2017-10-29 DIAGNOSIS — Z7982 Long term (current) use of aspirin: Secondary | ICD-10-CM | POA: Diagnosis not present

## 2017-10-29 DIAGNOSIS — Z87891 Personal history of nicotine dependence: Secondary | ICD-10-CM | POA: Insufficient documentation

## 2017-10-29 DIAGNOSIS — M109 Gout, unspecified: Secondary | ICD-10-CM | POA: Diagnosis not present

## 2017-10-29 DIAGNOSIS — M170 Bilateral primary osteoarthritis of knee: Secondary | ICD-10-CM | POA: Insufficient documentation

## 2017-10-29 DIAGNOSIS — E785 Hyperlipidemia, unspecified: Secondary | ICD-10-CM | POA: Diagnosis not present

## 2017-10-29 DIAGNOSIS — Z79899 Other long term (current) drug therapy: Secondary | ICD-10-CM | POA: Diagnosis not present

## 2017-10-29 LAB — GLUCOSE, POCT (MANUAL RESULT ENTRY): POC Glucose: 91 mg/dl (ref 70–99)

## 2017-10-29 MED ORDER — INDOMETHACIN 50 MG PO CAPS: 50.0000 mg | ORAL_CAPSULE | Freq: Two times a day (BID) | ORAL | 1 refills | Status: DC | PRN

## 2017-10-29 MED ORDER — DICLOFENAC SODIUM 1 % TD GEL: 2.0000 g | Freq: Four times a day (QID) | TRANSDERMAL | 2 refills | Status: DC

## 2017-10-29 MED ORDER — ZOLPIDEM TARTRATE 5 MG PO TABS: 5.0000 mg | ORAL_TABLET | Freq: Every evening | ORAL | 0 refills | Status: DC | PRN

## 2017-10-29 MED ORDER — TRAMADOL HCL 50 MG PO TABS: 50.0000 mg | ORAL_TABLET | Freq: Two times a day (BID) | ORAL | 0 refills | Status: DC | PRN

## 2017-10-29 MED FILL — traMADol HCL 50 MG TABS: 50 | 7 days supply | Qty: 14 | Fill #0

## 2017-10-29 NOTE — Progress Notes (Signed)
Patient ID: Desiree Sanchez, female    DOB: 1950-12-08  MRN: [id number]  CC: Diabetes and Hypertension   Subjective: Desiree Sanchez is a 67 y.o. 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  Uses it occasional when she can not sleep.  She denies any adverse side effects from the medication or significant drowsiness the following day after use.  She practices good sleep hygiene by trying to get in bed around the same time every night and turning off all lights and sounds.  Gout: no recent flare.  When she does have a flare she takes Colchicine first then if no relief, takes Indocin Request RF on Voltarin gel to use on knees, hands and LT shoulder  DM:  Checking BS once every other wk.  Usually below 100.  Takes Metformin.  Doing well with eating Goes to [facility] - swim and does light weights and treadmill 3 x a wk, does Tai chi also  CAD/HTN: Denies any chest pains, shortness of breath, lower extremity edema.  No PND orthopnea. Reports compliance with BiDil, carvedilol and atorvastatin  Patient Active Problem List   Diagnosis Date Noted  . CAD (coronary artery disease) 02/08/2017  . Insomnia 11/06/2016  . Iron deficiency anemia 09/14/2016  . Shoulder pain, left 02/11/2016  . Right carpal tunnel syndrome 07/29/2014  . Vitamin D insufficiency 04/13/2014  . Obesity (BMI 30-39.9) 03/13/2013  . Gout 05/20/2009  . HEART MURMUR, SYSTOLIC 67/61/9509  . DM2 (diabetes mellitus, type 2) ([place]) 04/22/2009  . Essential hypertension 04/22/2009     Current Outpatient Medications on File Prior to Visit  Medication Sig Dispense Refill  . ACCU-CHEK SOFTCLIX LANCETS lancets 1 each by Other route daily. ICD 10 E11.21 100 each 12  . albuterol (PROVENTIL  HFA;VENTOLIN HFA) 108 (90 Base) MCG/ACT inhaler Inhale 2 puffs into the lungs every 6 (six) hours as needed for wheezing or shortness of breath. 1 Inhaler 2  . aspirin EC 81 MG tablet Take 1 tablet (81 mg total) by mouth daily. 90 tablet 3  . atorvastatin (LIPITOR) 10 MG tablet Take 1 tablet (10 mg total) by mouth daily. 90 tablet 3  . Blood Glucose Monitoring Suppl (ACCU-CHEK AVIVA PLUS) w/Device KIT 1 Device by Does not apply route daily. ICD 10 E11.21 1 kit 0  . Blood Pressure KIT 1 each by Does not apply route daily. 1 each 0  . carvedilol (COREG) 25 MG tablet Take 1 tablet (25 mg total) by mouth 2 (two) times daily with a meal. 180 tablet 0  . colchicine 0.6 MG tablet Take 1 tablet (0.6 mg total) by mouth 2 (two) times daily as needed (gout flare). 180 tablet 0  . gabapentin (NEURONTIN) 100 MG capsule TAKE 3 CAPSULES AT BEDTIME 270 capsule 0  . glucose blood (ACCU-CHEK AVIVA PLUS) test strip 1 each by Other route daily. ICD 10 E11.21 100 each 12  . isosorbide-hydrALAZINE (BIDIL) 20-37.5 MG tablet Take 1 tablet by mouth 3 (three) times daily. 90 tablet 11  . loratadine (CLARITIN) 10 MG tablet Take 1 tablet (10 mg total) by mouth daily. 30 tablet 2  . metFORMIN (GLUCOPHAGE-XR) 500 MG 24 hr tablet TAKE 2 TABLETS EVERY DAY  AFTER  SUPPER 180 tablet 0  .  Triamcinolone Acetonide (TRIAMCINOLONE 0.1 % CREAM : EUCERIN) CREA Apply 1 application topically 2 (two) times daily as needed. Please mix 1:1 (Patient taking differently: Apply 1 application topically 2 (two) times daily as needed. Please mix 1:1) 1 each 3  . triamcinolone cream (KENALOG) 0.1 % Apply 1 application topically 2 (two) times daily. 30 g 0   No current facility-administered medications on file prior to visit.     Allergies  Allergen Reactions  . Lisinopril Swelling    REACTION: Swolen lips (only)  . Hctz [Hydrochlorothiazide] Other (See Comments)    Joint pain   . Naproxen Itching and Palpitations  . Norvasc [Amlodipine  Besylate] Other (See Comments)    Peripheral edema     Social History   Socioeconomic History  . Marital status: Single    Spouse name: Not on file  . Number of children: Not on file  . Years of education: Not on file  . Highest education level: Not on file  Occupational History  . Not on file  Social Needs  . Financial resource strain: Not on file  . Food insecurity:    Worry: Not on file    Inability: Not on file  . Transportation needs:    Medical: Not on file    Non-medical: Not on file  Tobacco Use  . Smoking status: Former Research scientist (life sciences)  . Smokeless tobacco: Never Used  . Tobacco comment: QUIT SMOKING IN 2009  Substance and Sexual Activity  . Alcohol use: Yes    Comment: occasionally  . Drug use: No  . Sexual activity: Not on file  Lifestyle  . Physical activity:    Days per week: Not on file    Minutes per session: Not on file  . Stress: Not on file  Relationships  . Social connections:    Talks on phone: Not on file    Gets together: Not on file    Attends religious service: Not on file    Active member of club or organization: Not on file    Attends meetings of clubs or organizations: Not on file    Relationship status: Not on file  . Intimate partner violence:    Fear of current or ex partner: Not on file    Emotionally abused: Not on file    Physically abused: Not on file    Forced sexual activity: Not on file  Other Topics Concern  . Not on file  Social History Narrative  . Not on file    Family History  Problem Relation Age of Onset  . Diabetes Mother   . Diabetes Sister     Past Surgical History:  Procedure Laterality Date  . APPENDECTOMY    . INTRAVASCULAR PRESSURE WIRE/FFR STUDY N/A 09/07/2016   Procedure: Intravascular Pressure Wire/FFR Study;  Surgeon: Nelva Bush, MD;  Location: [facility];  Service: Cardiovascular;  Laterality: N/A;  . LEFT HEART CATH AND CORONARY ANGIOGRAPHY N/A 09/07/2016   Procedure: Left Heart Cath and  Coronary Angiography;  Surgeon: Nelva Bush, MD;  Location: [facility];  Service: Cardiovascular;  Laterality: N/A;  . TUBAL LIGATION      ROS: Review of Systems Negative except as stated above PHYSICAL EXAM: BP 123/70   Pulse 73   Temp 98.3 F (36.8 C) (Oral)   Resp 16   Wt 249 lb 3.2 oz (113 kg)   SpO2 96%   BMI 35.76 kg/m   Physical Exam  General appearance - alert, well appearing,  and in no distress Mental status - normal mood, behavior, speech, dress, motor activity, and thought processes Neck - supple, no significant adenopathy Chest - clear to auscultation, no wheezes, rales or rhonchi, symmetric air entry Heart - normal rate, regular rhythm, normal S1, S2, no murmurs, rubs, clicks or gallops Musculoskeletal -knees: Large body habitus.  Positive joint enlargement.  She has moderate to severe crepitus on passive movement of the left knee.  Mild crepitus on passive movement of the right. Extremities -lower extremity edema Diabetic Foot Exam - Simple   Simple Foot Form Visual Inspection No deformities, no ulcerations, no other skin breakdown bilaterally:  Yes Sensation Testing Intact to touch and monofilament testing bilaterally:  Yes Pulse Check Posterior Tibialis and Dorsalis pulse intact bilaterally:  Yes Comments     Results for orders placed or performed in visit on 10/29/17  POCT glucose (manual entry)  Result Value Ref Range   POC Glucose 91 70 - 99 mg/dl   Lab Results  Component Value Date   HGBA1C 6.2 02/16/2017    ASSESSMENT AND PLAN: 1. Type 2 diabetes mellitus with complication, unspecified whether long term insulin use (HCC) Control.  Continue metformin.  Commended her on healthy eating and regular exercise.  Encouraged her to keep up the good work. - POCT glucose (manual entry) - Hemoglobin A1c - Microalbumin / creatinine urine ratio - CBC - Comprehensive metabolic panel - Lipid panel  2. Chronic left shoulder pain 3. Primary  osteoarthritis of both knees Patient requesting refill on tramadol which he takes sparingly.  She denies any adverse effects from the medication.  She is very active. Martin Majestic over our new controlled substance prescribing agreement with her.  She is agreeable to the terms and signed the document today. - diclofenac sodium (VOLTAREN) 1 % GEL; Apply 2 g topically 4 (four) times daily.  Dispense: 100 g; Refill: 2 - traMADol (ULTRAM) 50 MG tablet; Take 1 tablet (50 mg total) by mouth 2 (two) times daily as needed for moderate pain.  Dispense: 60 tablet; Refill: 0  4. Right carpal tunnel syndrome - traMADol (ULTRAM) 50 MG tablet; Take 1 tablet (50 mg total) by mouth 2 (two) times daily as needed for moderate pain.  Dispense: 60 tablet; Refill: 0  5. History of gout -Refill Indocin but reminded her to use sparingly because of kidney function - indomethacin (INDOCIN) 50 MG capsule; Take 1 capsule (50 mg total) by mouth 2 (two) times daily as needed (for acute gout attacks.).  Dispense: 30 capsule; Refill: 1  6. Primary insomnia Good sleep hygiene encouraged and reviewed.  Refill on Ambien which she uses sparingly.  Went over possible side effects of the medication including sleepwalking, sleep eating and potential falls.  Non-opioid controlled substance prescribing agreement reviewed with patient and patient signed the document today. - zolpidem (AMBIEN) 5 MG tablet; Take 1 tablet (5 mg total) by mouth at bedtime as needed for sleep.  Dispense: 30 tablet; Refill: 0  7. Coronary artery disease involving native coronary artery of native heart without angina pectoris Stable.  Continue BiDil, statin and beta-blocker  8. Essential hypertension Control.  Patient was given the opportunity to ask questions.  Patient verbalized understanding of the plan and was able to repeat key elements of the plan.   Orders Placed This Encounter  Procedures  . Hemoglobin A1c  . Microalbumin / creatinine urine ratio  .  CBC  . Comprehensive metabolic panel  . Lipid panel  . POCT glucose (manual entry)  Requested Prescriptions   Signed Prescriptions Disp Refills  . diclofenac sodium (VOLTAREN) 1 % GEL 100 g 2    Sig: Apply 2 g topically 4 (four) times daily.  . traMADol (ULTRAM) 50 MG tablet 60 tablet 0    Sig: Take 1 tablet (50 mg total) by mouth 2 (two) times daily as needed for moderate pain.  Marland Kitchen zolpidem (AMBIEN) 5 MG tablet 30 tablet 0    Sig: Take 1 tablet (5 mg total) by mouth at bedtime as needed for sleep.  . indomethacin (INDOCIN) 50 MG capsule 30 capsule 1    Sig: Take 1 capsule (50 mg total) by mouth 2 (two) times daily as needed (for acute gout attacks.).    Return in about 3 months (around 01/29/2018).  Karle Plumber, MD, FACP

## 2017-10-30 LAB — COMPREHENSIVE METABOLIC PANEL
ALBUMIN: 4.1 g/dL (ref 3.6–4.8)
ALK PHOS: 71 IU/L (ref 39–117)
ALT: 11 IU/L (ref 0–32)
AST: 17 IU/L (ref 0–40)
Albumin/Globulin Ratio: 1.3 (ref 1.2–2.2)
BUN / CREAT RATIO: 13 (ref 12–28)
BUN: 16 mg/dL (ref 8–27)
Bilirubin Total: 0.5 mg/dL (ref 0.0–1.2)
CHLORIDE: 108 mmol/L — AB (ref 96–106)
CO2: 16 mmol/L — AB (ref 20–29)
Calcium: 9.6 mg/dL (ref 8.7–10.3)
Creatinine, Ser: 1.25 mg/dL — ABNORMAL HIGH (ref 0.57–1.00)
GFR calc Af Amer: 51 mL/min/{1.73_m2} — ABNORMAL LOW (ref >59)
GFR calc non Af Amer: 44 mL/min/{1.73_m2} — ABNORMAL LOW (ref >59)
GLUCOSE: 137 mg/dL — AB (ref 65–99)
Globulin, Total: 3.1 g/dL (ref 1.5–4.5)
Potassium: 4.4 mmol/L (ref 3.5–5.2)
Sodium: 141 mmol/L (ref 134–144)
Total Protein: 7.2 g/dL (ref 6.0–8.5)

## 2017-10-30 LAB — LIPID PANEL
CHOLESTEROL TOTAL: 139 mg/dL (ref 100–199)
Chol/HDL Ratio: 2.8 ratio (ref 0.0–4.4)
HDL: 50 mg/dL (ref >39)
LDL CALC: 72 mg/dL (ref 0–99)
Triglycerides: 85 mg/dL (ref 0–149)
VLDL CHOLESTEROL CAL: 17 mg/dL (ref 5–40)

## 2017-10-30 LAB — CBC
HEMOGLOBIN: 12 g/dL (ref 11.1–15.9)
Hematocrit: 36.1 % (ref 34.0–46.6)
MCH: 28.8 pg (ref 26.6–33.0)
MCHC: 33.2 g/dL (ref 31.5–35.7)
MCV: 87 fL (ref 79–97)
PLATELETS: 273 10*3/uL (ref 150–450)
RBC: 4.16 x10E6/uL (ref 3.77–5.28)
RDW: 15.1 % (ref 12.3–15.4)
WBC: 6.7 10*3/uL (ref 3.4–10.8)

## 2017-10-30 LAB — MICROALBUMIN / CREATININE URINE RATIO
Creatinine, Urine: 155.8 mg/dL
MICROALBUM., U, RANDOM: 16.9 ug/mL
Microalb/Creat Ratio: 10.8 mg/g creat (ref 0.0–30.0)

## 2017-10-30 LAB — HEMOGLOBIN A1C
ESTIMATED AVERAGE GLUCOSE: 151 mg/dL
HEMOGLOBIN A1C: 6.9 % — AB (ref 4.8–5.6)

## 2017-11-12 ENCOUNTER — Other Ambulatory Visit: Payer: Self-pay | Admitting: Internal Medicine

## 2017-11-12 ENCOUNTER — Other Ambulatory Visit: Payer: Self-pay

## 2017-11-12 DIAGNOSIS — I10 Essential (primary) hypertension: Secondary | ICD-10-CM

## 2017-11-12 MED ORDER — ISOSORB DINITRATE-HYDRALAZINE 20-37.5 MG PO TABS: 1.0000 | ORAL_TABLET | Freq: Three times a day (TID) | ORAL | 11 refills | Status: DC

## 2017-11-12 MED FILL — BIDIL TABLET: 20-37.5 | 30 days supply | Qty: 90 | Fill #0

## 2017-12-11 ENCOUNTER — Other Ambulatory Visit: Payer: Self-pay | Admitting: Internal Medicine

## 2017-12-11 DIAGNOSIS — Z8739 Personal history of other diseases of the musculoskeletal system and connective tissue: Secondary | ICD-10-CM

## 2017-12-11 DIAGNOSIS — I2510 Atherosclerotic heart disease of native coronary artery without angina pectoris: Secondary | ICD-10-CM

## 2017-12-20 MED FILL — BIDIL TABLET: 20-37.5 | 30 days supply | Qty: 90 | Fill #1

## 2017-12-27 MED FILL — traMADol HCL 50 MG TABS: 50 | 7 days supply | Qty: 14 | Fill #1

## 2017-12-29 ENCOUNTER — Other Ambulatory Visit: Payer: Self-pay

## 2017-12-29 DIAGNOSIS — I10 Essential (primary) hypertension: Secondary | ICD-10-CM

## 2017-12-29 MED ORDER — CARVEDILOL 25 MG PO TABS: 25.0000 mg | ORAL_TABLET | Freq: Two times a day (BID) | ORAL | 0 refills | Status: DC

## 2018-01-13 DIAGNOSIS — H5213 Myopia, bilateral: Secondary | ICD-10-CM | POA: Diagnosis not present

## 2018-01-13 LAB — HM DIABETES EYE EXAM

## 2018-01-25 MED FILL — BIDIL TABLET: 20-37.5 | 90 days supply | Qty: 270 | Fill #2

## 2018-01-26 ENCOUNTER — Other Ambulatory Visit: Payer: Self-pay | Admitting: Internal Medicine

## 2018-01-26 DIAGNOSIS — G5601 Carpal tunnel syndrome, right upper limb: Secondary | ICD-10-CM

## 2018-01-26 MED ORDER — GABAPENTIN 100 MG PO CAPS: ORAL_CAPSULE | ORAL | 1 refills | Status: DC

## 2018-02-15 ENCOUNTER — Other Ambulatory Visit: Payer: Self-pay | Admitting: Internal Medicine

## 2018-02-15 DIAGNOSIS — I2510 Atherosclerotic heart disease of native coronary artery without angina pectoris: Secondary | ICD-10-CM

## 2018-03-25 ENCOUNTER — Encounter: Payer: Self-pay | Admitting: Internal Medicine

## 2018-03-25 ENCOUNTER — Ambulatory Visit: Payer: Medicare HMO | Attending: Internal Medicine | Admitting: Internal Medicine

## 2018-03-25 VITALS — BP 145/82 | HR 60 | Temp 98.6°F | Resp 16 | Wt 254.2 lb

## 2018-03-25 DIAGNOSIS — Z7984 Long term (current) use of oral hypoglycemic drugs: Secondary | ICD-10-CM | POA: Insufficient documentation

## 2018-03-25 DIAGNOSIS — E1121 Type 2 diabetes mellitus with diabetic nephropathy: Secondary | ICD-10-CM | POA: Diagnosis not present

## 2018-03-25 DIAGNOSIS — I2510 Atherosclerotic heart disease of native coronary artery without angina pectoris: Secondary | ICD-10-CM | POA: Diagnosis not present

## 2018-03-25 DIAGNOSIS — I129 Hypertensive chronic kidney disease with stage 1 through stage 4 chronic kidney disease, or unspecified chronic kidney disease: Secondary | ICD-10-CM | POA: Diagnosis not present

## 2018-03-25 DIAGNOSIS — N183 Chronic kidney disease, stage 3 unspecified: Secondary | ICD-10-CM

## 2018-03-25 DIAGNOSIS — E1122 Type 2 diabetes mellitus with diabetic chronic kidney disease: Secondary | ICD-10-CM | POA: Insufficient documentation

## 2018-03-25 DIAGNOSIS — L97511 Non-pressure chronic ulcer of other part of right foot limited to breakdown of skin: Secondary | ICD-10-CM | POA: Diagnosis not present

## 2018-03-25 DIAGNOSIS — M109 Gout, unspecified: Secondary | ICD-10-CM | POA: Insufficient documentation

## 2018-03-25 DIAGNOSIS — Z760 Encounter for issue of repeat prescription: Secondary | ICD-10-CM | POA: Diagnosis not present

## 2018-03-25 DIAGNOSIS — Z6836 Body mass index (BMI) 36.0-36.9, adult: Secondary | ICD-10-CM | POA: Insufficient documentation

## 2018-03-25 DIAGNOSIS — M170 Bilateral primary osteoarthritis of knee: Secondary | ICD-10-CM | POA: Insufficient documentation

## 2018-03-25 DIAGNOSIS — E118 Type 2 diabetes mellitus with unspecified complications: Secondary | ICD-10-CM

## 2018-03-25 DIAGNOSIS — Z888 Allergy status to other drugs, medicaments and biological substances status: Secondary | ICD-10-CM | POA: Insufficient documentation

## 2018-03-25 DIAGNOSIS — E669 Obesity, unspecified: Secondary | ICD-10-CM | POA: Insufficient documentation

## 2018-03-25 DIAGNOSIS — G4700 Insomnia, unspecified: Secondary | ICD-10-CM | POA: Insufficient documentation

## 2018-03-25 DIAGNOSIS — I10 Essential (primary) hypertension: Secondary | ICD-10-CM | POA: Diagnosis not present

## 2018-03-25 DIAGNOSIS — Z23 Encounter for immunization: Secondary | ICD-10-CM | POA: Diagnosis not present

## 2018-03-25 DIAGNOSIS — G5601 Carpal tunnel syndrome, right upper limb: Secondary | ICD-10-CM

## 2018-03-25 DIAGNOSIS — E559 Vitamin D deficiency, unspecified: Secondary | ICD-10-CM | POA: Insufficient documentation

## 2018-03-25 DIAGNOSIS — D509 Iron deficiency anemia, unspecified: Secondary | ICD-10-CM | POA: Insufficient documentation

## 2018-03-25 DIAGNOSIS — Z87891 Personal history of nicotine dependence: Secondary | ICD-10-CM | POA: Insufficient documentation

## 2018-03-25 DIAGNOSIS — Z7982 Long term (current) use of aspirin: Secondary | ICD-10-CM | POA: Insufficient documentation

## 2018-03-25 DIAGNOSIS — Z79899 Other long term (current) drug therapy: Secondary | ICD-10-CM | POA: Insufficient documentation

## 2018-03-25 DIAGNOSIS — Z886 Allergy status to analgesic agent status: Secondary | ICD-10-CM | POA: Insufficient documentation

## 2018-03-25 LAB — POCT GLYCOSYLATED HEMOGLOBIN (HGB A1C): HBA1C, POC (CONTROLLED DIABETIC RANGE): 6.5 % (ref 0.0–7.0)

## 2018-03-25 LAB — GLUCOSE, POCT (MANUAL RESULT ENTRY): POC GLUCOSE: 136 mg/dL — AB (ref 70–99)

## 2018-03-25 MED ORDER — ALBUTEROL SULFATE HFA 108 (90 BASE) MCG/ACT IN AERS: 2.0000 | INHALATION_SPRAY | Freq: Four times a day (QID) | RESPIRATORY_TRACT | 2 refills | Status: DC | PRN

## 2018-03-25 MED ORDER — DICLOFENAC SODIUM 1 % TD GEL: 2.0000 g | Freq: Four times a day (QID) | TRANSDERMAL | 3 refills | Status: DC

## 2018-03-25 MED ORDER — METFORMIN HCL ER 500 MG PO TB24: 1000.0000 mg | ORAL_TABLET | Freq: Every day | ORAL | 3 refills | Status: DC

## 2018-03-25 MED ORDER — GABAPENTIN 100 MG PO CAPS: ORAL_CAPSULE | ORAL | 3 refills | Status: DC

## 2018-03-25 MED ORDER — CARVEDILOL 25 MG PO TABS: 25.0000 mg | ORAL_TABLET | Freq: Two times a day (BID) | ORAL | 3 refills | Status: DC

## 2018-03-25 MED ORDER — LORATADINE 10 MG PO TABS: 10.0000 mg | ORAL_TABLET | Freq: Every day | ORAL | 2 refills | Status: DC | PRN

## 2018-03-25 MED ORDER — ATORVASTATIN CALCIUM 10 MG PO TABS: 10.0000 mg | ORAL_TABLET | Freq: Every day | ORAL | 3 refills | Status: DC

## 2018-03-25 MED ORDER — ISOSORB DINITRATE-HYDRALAZINE 20-37.5 MG PO TABS: 1.0000 | ORAL_TABLET | Freq: Three times a day (TID) | ORAL | 11 refills | Status: DC

## 2018-03-25 MED FILL — VENTOLIN HFA 90 MCG INHALER: 108 (90 BAS | 25 days supply | Qty: 18 | Fill #1

## 2018-03-25 NOTE — Progress Notes (Signed)
Patient ID: AVYANNA SPADA, female    DOB: 04-25-1951  MRN: [id number]  CC: No chief complaint on file.   Subjective: Naydelin Ziegler is a 67 y.o. female who presents for chronic disease management. 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 Decided she did not need them any more.  She also decided to stop them because her insurance would only pay for so much at a time. Joints bother her more with weather changes especially if it is going to rain.  Takes Tylenol if pain gets real bad.  Last took it 4 days ago. Needs RF on Voltaren gel.  Wanting to know if it safe to take [facility] powder  DM: checks BS occasionally.  Compliant with Metformin Does well with eating habits. Goes to [facility] 3 x a wk -uses treadmill or elliptical and swims Last eye exam was in August of this year by Dr. Sabra Heck.  Reports no retinopathy found.  HTN:  No device to check blood pressure Limits salt in foods but states that she ate some pork last evening. Denies any chest pain, shortness of breath, lower extremity edema.  No headaches or dizziness.  Requesting refills on several of her medications including gabapentin, albuterol inhaler and Voltaren gel. Patient Active Problem List   Diagnosis Date Noted  . Primary osteoarthritis of both knees 10/29/2017  . CAD (coronary artery disease) 02/08/2017  . Insomnia 11/06/2016  . Iron deficiency anemia 09/14/2016  . Shoulder pain, left 02/11/2016  . Right carpal tunnel syndrome 07/29/2014  . Vitamin D insufficiency 04/13/2014  . Obesity (BMI 30-39.9) 03/13/2013  . Gout 05/20/2009  . HEART MURMUR, SYSTOLIC 45/85/9292  . DM2 (diabetes mellitus, type 2) ([place]) 04/22/2009  . Essential hypertension 04/22/2009     Current Outpatient Medications on File Prior to Visit  Medication Sig Dispense Refill  .  ACCU-CHEK SOFTCLIX LANCETS lancets 1 each by Other route daily. ICD 10 E11.21 100 each 12  . albuterol (PROVENTIL HFA;VENTOLIN HFA) 108 (90 Base) MCG/ACT inhaler Inhale 2 puffs into the lungs every 6 (six) hours as needed for wheezing or shortness of breath. 1 Inhaler 2  . aspirin EC 81 MG tablet Take 1 tablet (81 mg total) by mouth daily. 90 tablet 3  . atorvastatin (LIPITOR) 10 MG tablet TAKE 1 TABLET EVERY DAY 90 tablet 0  . Blood Glucose Monitoring Suppl (ACCU-CHEK AVIVA PLUS) w/Device KIT 1 Device by Does not apply route daily. ICD 10 E11.21 1 kit 0  . Blood Pressure KIT 1 each by Does not apply route daily. 1 each 0  . carvedilol (COREG) 25 MG tablet Take 1 tablet (25 mg total) by mouth 2 (two) times daily with a meal. 180 tablet 0  . colchicine 0.6 MG tablet Take 1 tablet (0.6 mg total) by mouth 2 (two) times daily as needed (gout flare). 180 tablet 0  . diclofenac sodium (VOLTAREN) 1 % GEL Apply 2 g topically 4 (four) times daily. 100 g 2  . gabapentin (NEURONTIN) 100 MG capsule TAKE 3 CAPSULES AT BEDTIME 270 capsule 1  . glucose blood (ACCU-CHEK AVIVA PLUS) test strip 1 each by Other route daily. ICD 10 E11.21 100 each 12  . indomethacin (INDOCIN) 50 MG capsule TAKE 1 CAPSULE TWICE DAILY AS NEEDED FOR ACUTE GOUT ATTACKS 30 capsule 1  . isosorbide-hydrALAZINE (  BIDIL) 20-37.5 MG tablet Take 1 tablet by mouth 3 (three) times daily. 90 tablet 11  . loratadine (CLARITIN) 10 MG tablet Take 1 tablet (10 mg total) by mouth daily. 30 tablet 2  . metFORMIN (GLUCOPHAGE-XR) 500 MG 24 hr tablet TAKE 2 TABLETS EVERY DAY  AFTER  SUPPER 180 tablet 0  . traMADol (ULTRAM) 50 MG tablet Take 1 tablet (50 mg total) by mouth 2 (two) times daily as needed for moderate pain. (Patient not taking: Reported on 03/25/2018) 60 tablet 0  . Triamcinolone Acetonide (TRIAMCINOLONE 0.1 % CREAM : EUCERIN) CREA Apply 1 application topically 2 (two) times daily as needed. Please mix 1:1 (Patient taking differently: Apply 1  application topically 2 (two) times daily as needed. Please mix 1:1) 1 each 3  . triamcinolone cream (KENALOG) 0.1 % Apply 1 application topically 2 (two) times daily. 30 g 0  . zolpidem (AMBIEN) 5 MG tablet Take 1 tablet (5 mg total) by mouth at bedtime as needed for sleep. (Patient not taking: Reported on 03/25/2018) 30 tablet 0   No current facility-administered medications on file prior to visit.     Allergies  Allergen Reactions  . Lisinopril Swelling    REACTION: Swolen lips (only)  . Hctz [Hydrochlorothiazide] Other (See Comments)    Joint pain   . Naproxen Itching and Palpitations  . Norvasc [Amlodipine Besylate] Other (See Comments)    Peripheral edema     Social History   Socioeconomic History  . Marital status: Single    Spouse name: Not on file  . Number of children: Not on file  . Years of education: Not on file  . Highest education level: Not on file  Occupational History  . Not on file  Social Needs  . Financial resource strain: Not on file  . Food insecurity:    Worry: Not on file    Inability: Not on file  . Transportation needs:    Medical: Not on file    Non-medical: Not on file  Tobacco Use  . Smoking status: Former Research scientist (life sciences)  . Smokeless tobacco: Never Used  . Tobacco comment: QUIT SMOKING IN 2009  Substance and Sexual Activity  . Alcohol use: Yes    Comment: occasionally  . Drug use: No  . Sexual activity: Not on file  Lifestyle  . Physical activity:    Days per week: Not on file    Minutes per session: Not on file  . Stress: Not on file  Relationships  . Social connections:    Talks on phone: Not on file    Gets together: Not on file    Attends religious service: Not on file    Active member of club or organization: Not on file    Attends meetings of clubs or organizations: Not on file    Relationship status: Not on file  . Intimate partner violence:    Fear of current or ex partner: Not on file    Emotionally abused: Not on file     Physically abused: Not on file    Forced sexual activity: Not on file  Other Topics Concern  . Not on file  Social History Narrative  . Not on file    Family History  Problem Relation Age of Onset  . Diabetes Mother   . Diabetes Sister     Past Surgical History:  Procedure Laterality Date  . APPENDECTOMY    . INTRAVASCULAR PRESSURE WIRE/FFR STUDY N/A 09/07/2016   Procedure: Intravascular Pressure Wire/FFR  Study;  Surgeon: Nelva Bush, MD;  Location: [facility];  Service: Cardiovascular;  Laterality: N/A;  . LEFT HEART CATH AND CORONARY ANGIOGRAPHY N/A 09/07/2016   Procedure: Left Heart Cath and Coronary Angiography;  Surgeon: Nelva Bush, MD;  Location: [facility];  Service: Cardiovascular;  Laterality: N/A;  . TUBAL LIGATION      ROS: Review of Systems Negative except as stated above. PHYSICAL EXAM: BP (!) 145/82   Pulse 60   Temp 98.6 F (37 C) (Oral)   Resp 16   Wt 254 lb 3.2 oz (115.3 kg)   SpO2 97%   BMI 36.47 kg/m   Repeat BP 150/90 Physical Exam  General appearance - alert, well appearing, and in no distress Mental status - normal mood, behavior, speech, dress, motor activity, and thought processes Neck - supple, no significant adenopathy Chest - clear to auscultation, no wheezes, rales or rhonchi, symmetric air entry Heart - normal rate, regular rhythm, normal S1, S2, no murmurs, rubs, clicks or gallops Musculoskeletal -knees: Joints are enlarged.  Moderate crepitus on passive movement of the left knee. Extremities - peripheral pulses normal, no pedal edema, no clubbing or cyanosis  Results for orders placed or performed in visit on 03/25/18  POCT glucose (manual entry)  Result Value Ref Range   POC Glucose 136 (A) 70 - 99 mg/dl  POCT glycosylated hemoglobin (Hb A1C)  Result Value Ref Range   Hemoglobin A1C     HbA1c POC (<> result, manual entry)     HbA1c, POC (prediabetic range)     HbA1c, POC (controlled diabetic range) 6.5 0.0 -  7.0 %    ASSESSMENT AND PLAN: 1. Essential hypertension Not at goal.  She is compliant with current medications.  We discussed putting her on another blood pressure medication versus having her come back in several weeks for recheck with our clinical pharmacist.  She opted for the latter.  I also recommended that she check to see if there is a blood pressure device at her gym so that she can check her blood pressure there when she goes twice a week for exercise. - isosorbide-hydrALAZINE (BIDIL) 20-37.5 MG tablet; Take 1 tablet by mouth 3 (three) times daily.  Dispense: 90 tablet; Refill: 11 - carvedilol (COREG) 25 MG tablet; Take 1 tablet (25 mg total) by mouth 2 (two) times daily with a meal.  Dispense: 180 tablet; Refill: 0  2. Primary osteoarthritis of both knees Advised against use of BC powder.  Patient told it is okay to take Tylenol 500 mg or 650 mg twice a day as needed.  Encouraged her to continue regular exercise - diclofenac sodium (VOLTAREN) 1 % GEL; Apply 2 g topically 4 (four) times daily.  Dispense: 100 g; Refill: 2  3. Coronary artery disease involving native coronary artery of native heart without angina pectoris Clinically stable. - atorvastatin (LIPITOR) 10 MG tablet; Take 1 tablet (10 mg total) by mouth daily.  Dispense: 90 tablet; Refill: 0  4. Right carpal tunnel syndrome Patient requested refill on gabapentin.  It was initially prescribed for CTS - gabapentin (NEURONTIN) 100 MG capsule; TAKE 3 CAPSULES AT BEDTIME  Dispense: 270 capsule; Refill: 1  5. Type 2 diabetes mellitus with diabetic nephropathy, without long-term current use of insulin (HCC) At goal.  Continue healthy eating habits and regular exercise - metFORMIN (GLUCOPHAGE-XR) 500 MG 24 hr tablet  Dispense: 180 tablet; Refill: 0  6: CKD Advised to avoid NSAIDs.  Advised against using BC  powder.  7: Need for influenza vaccine. Given today. Patient was given the opportunity to ask questions.  Patient  verbalized understanding of the plan and was able to repeat key elements of the plan.   No orders of the defined types were placed in this encounter.    Requested Prescriptions   Pending Prescriptions Disp Refills  . isosorbide-hydrALAZINE (BIDIL) 20-37.5 MG tablet 90 tablet 11    Sig: Take 1 tablet by mouth 3 (three) times daily.  . diclofenac sodium (VOLTAREN) 1 % GEL 100 g 2    Sig: Apply 2 g topically 4 (four) times daily.  . carvedilol (COREG) 25 MG tablet 180 tablet 0    Sig: Take 1 tablet (25 mg total) by mouth 2 (two) times daily with a meal.  . atorvastatin (LIPITOR) 10 MG tablet 90 tablet 0    Sig: Take 1 tablet (10 mg total) by mouth daily.  Marland Kitchen albuterol (PROVENTIL HFA;VENTOLIN HFA) 108 (90 Base) MCG/ACT inhaler 1 Inhaler 2    Sig: Inhale 2 puffs into the lungs every 6 (six) hours as needed for wheezing or shortness of breath.  . gabapentin (NEURONTIN) 100 MG capsule 270 capsule 1    Sig: TAKE 3 CAPSULES AT BEDTIME  . loratadine (CLARITIN) 10 MG tablet 30 tablet 2    Sig: Take 1 tablet (10 mg total) by mouth daily.  . metFORMIN (GLUCOPHAGE-XR) 500 MG 24 hr tablet 180 tablet 0    No follow-ups on file.  Karle Plumber, MD, FACP

## 2018-03-25 NOTE — Patient Instructions (Addendum)
Please give this patient a follow-up appointment with the clinical pharmacist in 1 month for repeat blood pressure check.  Please try to check your blood pressure at the gym when you go twice a week.  Goal is 130/80 or lower.  I would not take Goody powder as discussed.  Okay to use extra strength Tylenol which is the 500 mg twice daily as needed for joint pains.  Influenza Virus Vaccine injection (Fluarix) What is this medicine? INFLUENZA VIRUS VACCINE (in floo EN zuh VAHY ruhs vak SEEN) helps to reduce the risk of getting influenza also known as the flu. This medicine may be used for other purposes; ask your health care provider or pharmacist if you have questions. COMMON BRAND NAME(S): Fluarix, Fluzone What should I tell my health care provider before I take this medicine? They need to know if you have any of these conditions: -bleeding disorder like hemophilia -fever or infection -Guillain-Barre syndrome or other neurological problems -immune system problems -infection with the human immunodeficiency virus (HIV) or AIDS -low blood platelet counts -multiple sclerosis -an unusual or allergic reaction to influenza virus vaccine, eggs, chicken proteins, latex, gentamicin, other medicines, foods, dyes or preservatives -pregnant or trying to get pregnant -breast-feeding How should I use this medicine? This vaccine is for injection into a muscle. It is given by a health care professional. A copy of Vaccine Information Statements will be given before each vaccination. Read this sheet carefully each time. The sheet may change frequently. Talk to your pediatrician regarding the use of this medicine in children. Special care may be needed. Overdosage: If you think you have taken too much of this medicine contact a poison control center or emergency room at once. NOTE: This medicine is only for you. Do not share this medicine with others. What if I miss a dose? This does not apply. What may  interact with this medicine? -chemotherapy or radiation therapy -medicines that lower your immune system like etanercept, anakinra, infliximab, and adalimumab -medicines that treat or prevent blood clots like warfarin -phenytoin -steroid medicines like prednisone or cortisone -theophylline -vaccines This list may not describe all possible interactions. Give your health care provider a list of all the medicines, herbs, non-prescription drugs, or dietary supplements you use. Also tell them if you smoke, drink alcohol, or use illegal drugs. Some items may interact with your medicine. What should I watch for while using this medicine? Report any side effects that do not go away within 3 days to your doctor or health care professional. Call your health care provider if any unusual symptoms occur within 6 weeks of receiving this vaccine. You may still catch the flu, but the illness is not usually as bad. You cannot get the flu from the vaccine. The vaccine will not protect against colds or other illnesses that may cause fever. The vaccine is needed every year. What side effects may I notice from receiving this medicine? Side effects that you should report to your doctor or health care professional as soon as possible: -allergic reactions like skin rash, itching or hives, swelling of the face, lips, or tongue Side effects that usually do not require medical attention (report to your doctor or health care professional if they continue or are bothersome): -fever -headache -muscle aches and pains -pain, tenderness, redness, or swelling at site where injected -weak or tired This list may not describe all possible side effects. Call your doctor for medical advice about side effects. You may report side effects to FDA  at 1-800-FDA-1088. Where should I keep my medicine? This vaccine is only given in a clinic, pharmacy, doctor's office, or other health care setting and will not be stored at home. NOTE: This  sheet is a summary. It may not cover all possible information. If you have questions about this medicine, talk to your doctor, pharmacist, or health care provider.  2018 Elsevier/Gold Standard (2007-12-21 09:30:40)

## 2018-03-25 NOTE — Progress Notes (Signed)
[id number] a1c-6.5  Pt is requesting a refill on colchicine as well

## 2018-04-01 IMAGING — DX DG CHEST 2V
2 series · 2 of 2 positions shown · non-contrast
Comparison: Chest radiograph performed 04/08/2013

CLINICAL DATA: Acute onset of left-sided chest pain, radiating to
the back. Headache. Diaphoresis. Initial encounter.

EXAM:
CHEST  2 VIEW

[chest pa]
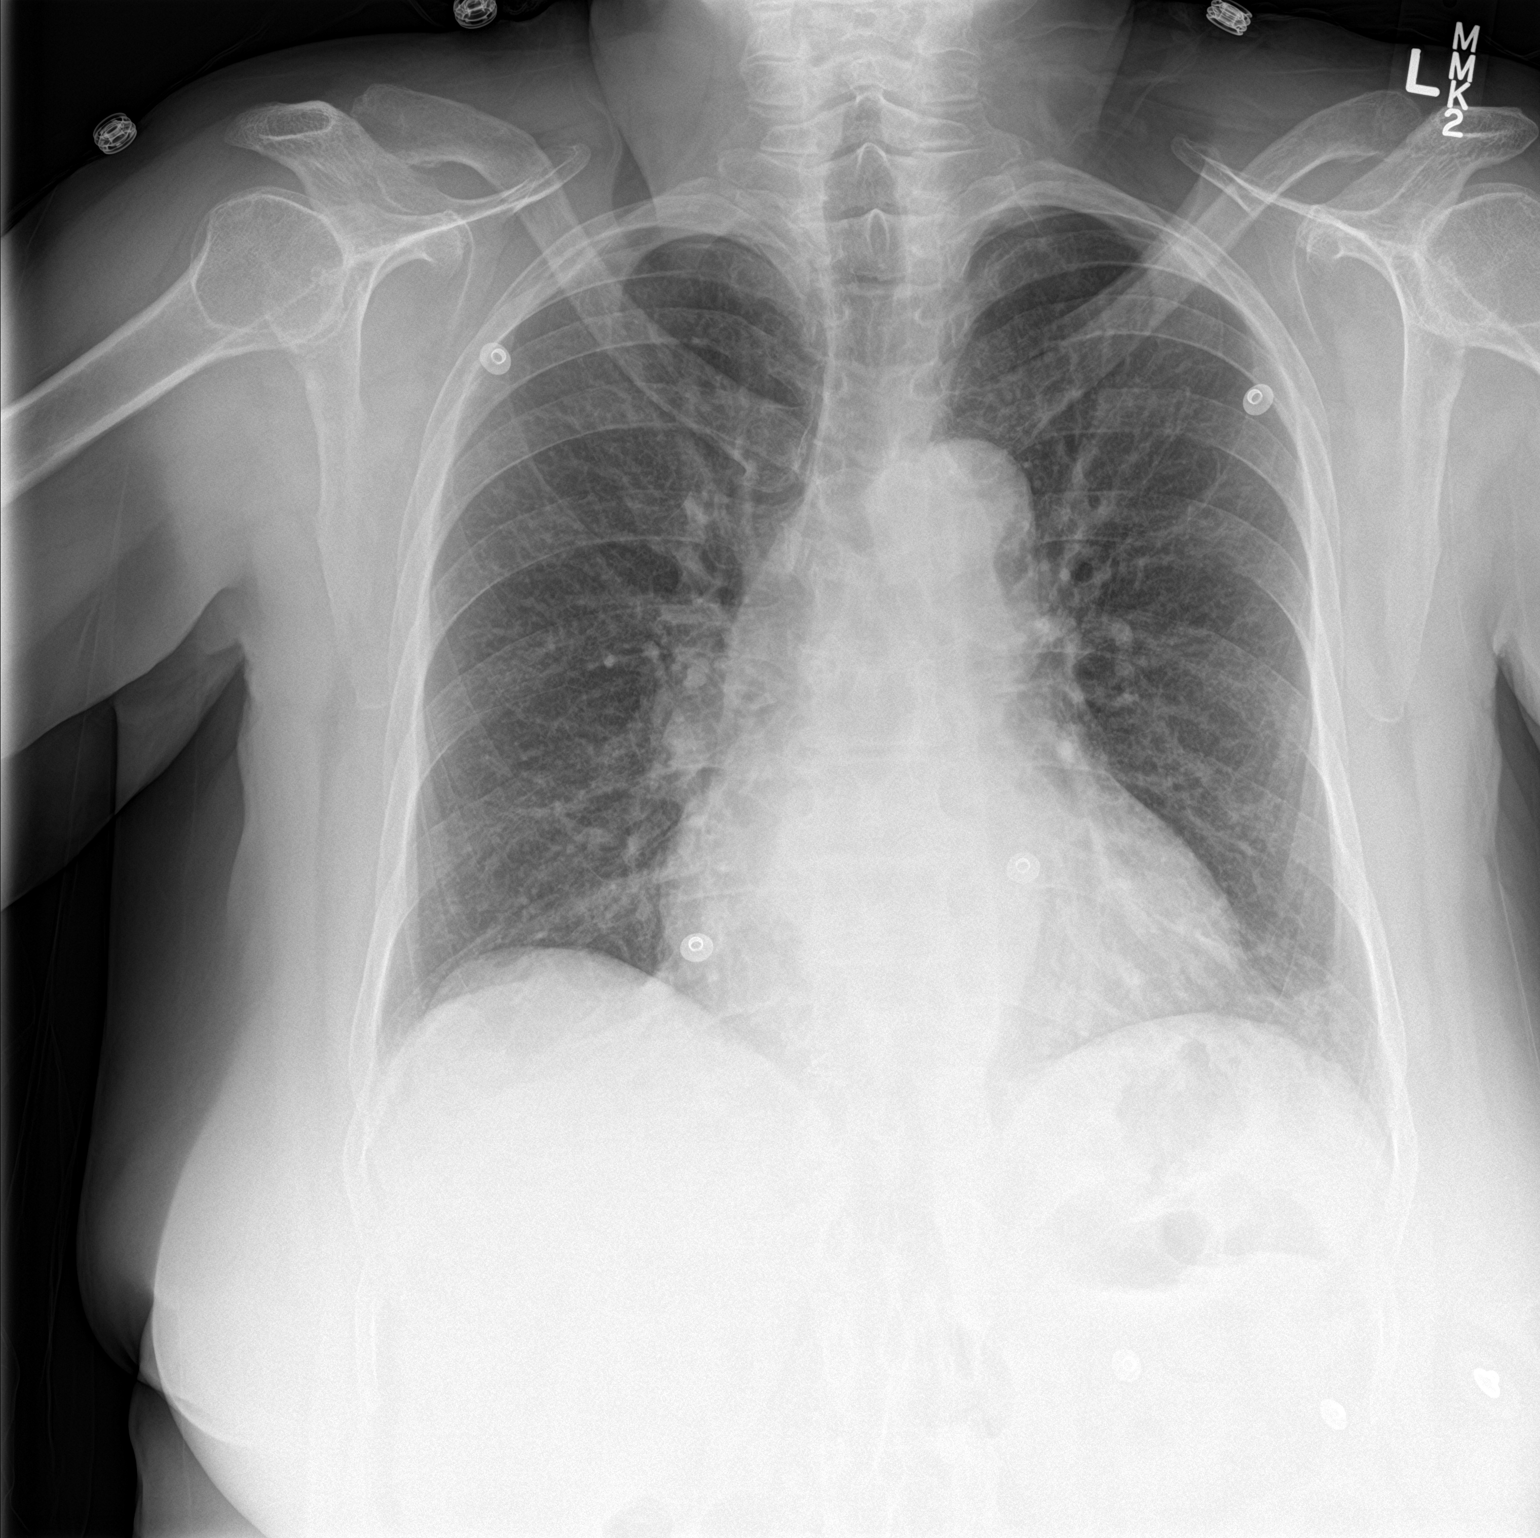

[chest lat]
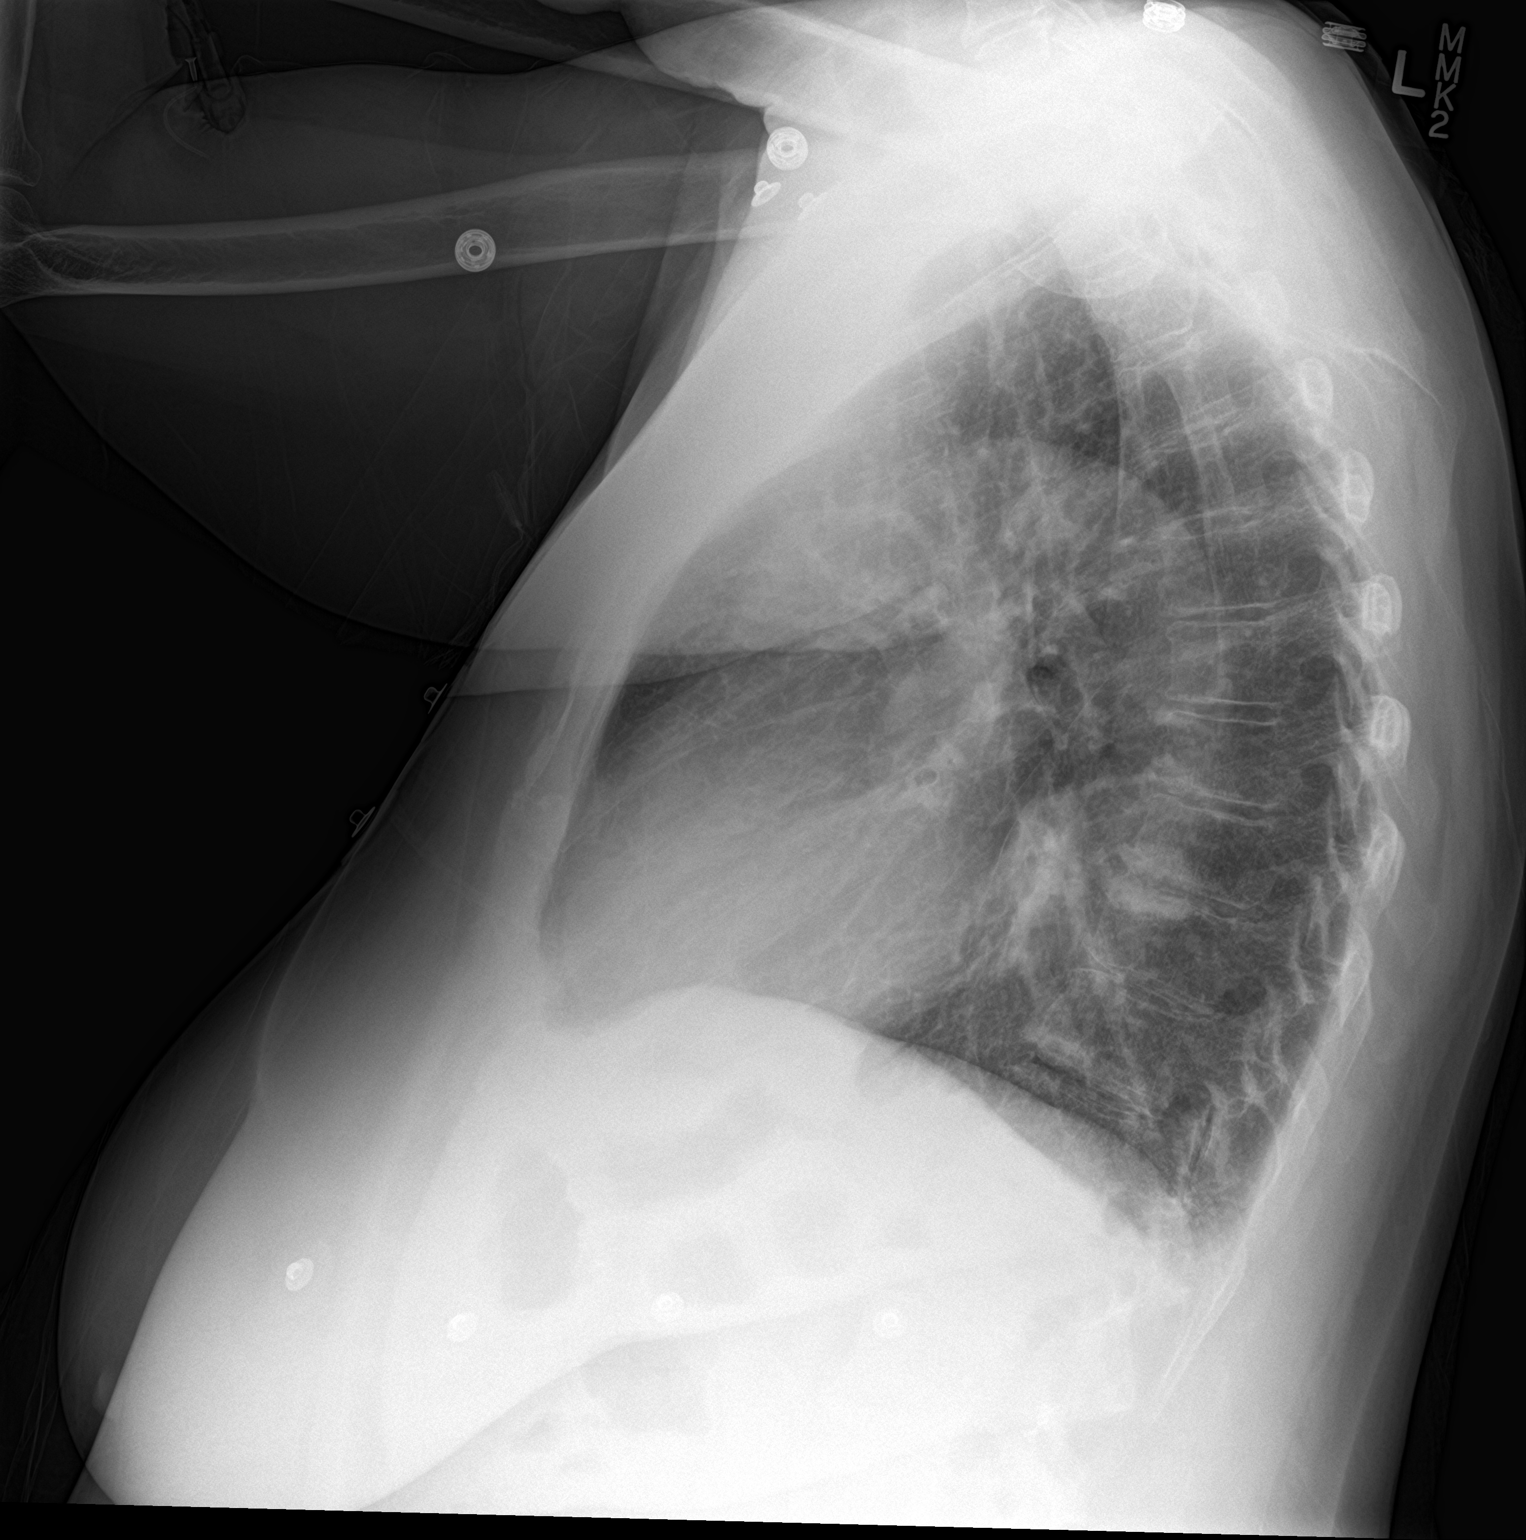

[2 of 2 positions shown; findings below may reference images not displayed]

FINDINGS: The lungs are well-aerated. Vascular congestion is noted. Minimal
left basilar atelectasis is seen. There is no evidence of pleural
effusion or pneumothorax.

The heart is borderline enlarged. No acute osseous abnormalities are
seen.
IMPRESSION: Vascular congestion and borderline cardiomegaly. Minimal left
basilar atelectasis noted.

## 2018-04-25 ENCOUNTER — Encounter: Payer: Medicare HMO | Admitting: Pharmacist

## 2018-04-25 NOTE — Progress Notes (Deleted)
   S:    PCP: Dr. Wynetta Emery  Patient arrives ***.    Presents to the clinic for hypertension management. Patient was referred by Dr. Wynetta Emery on 03/25/18. BP at that encounter 145/82.   Today, pt *** chest pain, headache, shortness of breath, or blurred vision. *** BLE edema.   Patient {Actions; denies-reports:120008} adherence with medications.  Current BP Medications include: carvedilol 25 mg BID, isosorbide-hydralazine 20-37.5 mg daily  Antihypertensives tried in the past include: lisinopril (lip swelling), HCTZ (joint pain), and amlodipine (peripheral edema).   Dietary habits include: *** Exercise habits include:*** Family / Social history: ***  ASCVD risk factors include:***  Home BP readings: ***  O:  L arm after 5 minutes rest: ***, HR ***  Last 3 Office BP readings: BP Readings from Last 3 Encounters:  03/25/18 (!) 145/82  10/29/17 123/70  07/15/17 135/76   BMET    Component Value Date/Time   NA 141 10/29/2017 1511   K 4.4 10/29/2017 1511   CL 108 (H) 10/29/2017 1511   CO2 16 (L) 10/29/2017 1511   GLUCOSE 137 (H) 10/29/2017 1511   GLUCOSE 98 09/08/2016 0252   BUN 16 10/29/2017 1511   CREATININE 1.25 (H) 10/29/2017 1511   CREATININE 1.19 (H) 08/03/2016 1529   CALCIUM 9.6 10/29/2017 1511   GFRNONAA 44 (L) 10/29/2017 1511   GFRNONAA 47 (L) 08/03/2016 1529   GFRAA 51 (L) 10/29/2017 1511   GFRAA 55 (L) 08/03/2016 1529    Renal function: CrCl cannot be calculated (Patient's most recent lab result is older than the maximum 21 days allowed.).  A/P: Hypertension longstanding/newly diagnosed currently *** on current medications. BP Goal = *** mmHg. Patient {Is/is not:9024} adherent with current medications.  -{Meds adjust:18428} ***.  -F/u labs ordered - *** -Counseled on lifestyle modifications for blood pressure control including reduced dietary sodium, increased exercise, adequate sleep  Results reviewed and written information provided.   Total time in  face-to-face counseling *** minutes.   F/U Clinic Visit in ***.  Patient seen with ***

## 2018-04-26 ENCOUNTER — Encounter: Payer: Self-pay | Admitting: Pharmacist

## 2018-04-27 ENCOUNTER — Ambulatory Visit: Payer: Medicare HMO | Attending: Internal Medicine | Admitting: Pharmacist

## 2018-04-27 ENCOUNTER — Encounter: Payer: Self-pay | Admitting: Pharmacist

## 2018-04-27 VITALS — BP 116/69 | HR 61

## 2018-04-27 DIAGNOSIS — I10 Essential (primary) hypertension: Secondary | ICD-10-CM | POA: Diagnosis not present

## 2018-04-27 DIAGNOSIS — Z87891 Personal history of nicotine dependence: Secondary | ICD-10-CM | POA: Diagnosis not present

## 2018-04-27 DIAGNOSIS — Z79891 Long term (current) use of opiate analgesic: Secondary | ICD-10-CM | POA: Diagnosis not present

## 2018-04-27 MED ORDER — TRIAMCINOLONE ACETONIDE 0.1 % EX CREA: 1.0000 "application " | TOPICAL_CREAM | Freq: Two times a day (BID) | CUTANEOUS | 0 refills | Status: DC

## 2018-04-27 NOTE — Patient Instructions (Signed)

## 2018-04-27 NOTE — Progress Notes (Signed)
   S:    PCP: Dr. Wynetta Emery   Patient arrives in good spirits. Presents to the clinic for hypertension management. Patient was referred by Dr. Wynetta Emery on 03/25/18. Pt opted to follow-up with me for a recheck vs starting a new medication at that visit.    Today, pt denies chest pain, shortness of breath, headache, or blurred vision.  Patient reports adherence with medications.  Current BP Medications include:  Carvedilol 25 mg BID, Bidil 20/37.5 TID,   Antihypertensives tried in the past include: lisinopril (lip swelling), HCTZ (joint pain), amlodipine (peripheral edema)  Dietary habits include: limits salt, drinks 1 cup of coffee/day Exercise habits include: goes to the [employer] 3 days/week and swims  Family / Social history: DM (mother, sister), former smoker (quit 2009), "occasional alcohol use"  ASCVD risk factors include: CAD, HTN, DM2,   Home BP readings: "working on getting cuff"  O:  L arm after 5 minutes rest 116/69, HR 61  Last 3 Office BP readings: BP Readings from Last 3 Encounters:  03/25/18 (!) 145/82  10/29/17 123/70  07/15/17 135/76   BMET    Component Value Date/Time   NA 141 10/29/2017 1511   K 4.4 10/29/2017 1511   CL 108 (H) 10/29/2017 1511   CO2 16 (L) 10/29/2017 1511   GLUCOSE 137 (H) 10/29/2017 1511   GLUCOSE 98 09/08/2016 0252   BUN 16 10/29/2017 1511   CREATININE 1.25 (H) 10/29/2017 1511   CREATININE 1.19 (H) 08/03/2016 1529   CALCIUM 9.6 10/29/2017 1511   GFRNONAA 44 (L) 10/29/2017 1511   GFRNONAA 47 (L) 08/03/2016 1529   GFRAA 51 (L) 10/29/2017 1511   GFRAA 55 (L) 08/03/2016 1529   Renal function: CrCl cannot be calculated (Patient's most recent lab result is older than the maximum 21 days allowed.).  A/P: Hypertension longstanding currently controlled on current medications. BP Goal <130/80 mmHg. Patient is adherent with current medications. Of note, pt would benefit from higher intensity statin. She has tried atorvastatin 20 and 40 mg in the  past.  -Continued anti-hypertensives.  -Counseled on lifestyle modifications for blood pressure control including reduced dietary sodium, increased exercise, adequate sleep -HM: current on PNA, tetanus, and influenza vaccines  Results reviewed and written information provided. Total time in face-to-face counseling 15 minutes.   F/U Clinic Visit w/ PCP 06/30/17.    Patient seen with:  Dixon Boos, PharmD Candidate [employer] of 2021  Benard Halsted, PharmD, [place] [phone]

## 2018-06-30 ENCOUNTER — Ambulatory Visit: Payer: Medicare HMO | Admitting: Internal Medicine

## 2018-07-12 ENCOUNTER — Other Ambulatory Visit: Payer: Self-pay | Admitting: Internal Medicine

## 2018-08-05 ENCOUNTER — Ambulatory Visit: Payer: Medicare HMO | Attending: Internal Medicine | Admitting: Internal Medicine

## 2018-08-05 ENCOUNTER — Encounter: Payer: Self-pay | Admitting: Internal Medicine

## 2018-08-05 VITALS — BP 130/79 | HR 68 | Temp 98.2°F | Resp 16 | Ht 70.5 in | Wt 250.2 lb

## 2018-08-05 DIAGNOSIS — M19042 Primary osteoarthritis, left hand: Secondary | ICD-10-CM

## 2018-08-05 DIAGNOSIS — E118 Type 2 diabetes mellitus with unspecified complications: Principal | ICD-10-CM

## 2018-08-05 DIAGNOSIS — E669 Obesity, unspecified: Secondary | ICD-10-CM

## 2018-08-05 DIAGNOSIS — IMO0002 Reserved for concepts with insufficient information to code with codable children: Secondary | ICD-10-CM

## 2018-08-05 DIAGNOSIS — E1165 Type 2 diabetes mellitus with hyperglycemia: Secondary | ICD-10-CM | POA: Diagnosis not present

## 2018-08-05 DIAGNOSIS — I2510 Atherosclerotic heart disease of native coronary artery without angina pectoris: Secondary | ICD-10-CM

## 2018-08-05 DIAGNOSIS — Z6835 Body mass index (BMI) 35.0-35.9, adult: Secondary | ICD-10-CM

## 2018-08-05 DIAGNOSIS — I10 Essential (primary) hypertension: Secondary | ICD-10-CM

## 2018-08-05 LAB — POCT GLYCOSYLATED HEMOGLOBIN (HGB A1C): HBA1C, POC (CONTROLLED DIABETIC RANGE): 7.6 % — AB (ref 0.0–7.0)

## 2018-08-05 LAB — GLUCOSE, POCT (MANUAL RESULT ENTRY): POC GLUCOSE: 111 mg/dL — AB (ref 70–99)

## 2018-08-05 MED ORDER — TRIAMCINOLONE ACETONIDE 0.1 % EX CREA: TOPICAL_CREAM | CUTANEOUS | 1 refills | Status: DC

## 2018-08-05 MED ORDER — ACCU-CHEK AVIVA PLUS W/DEVICE KIT: 1.0000 | PACK | Freq: Every day | 0 refills | Status: DC

## 2018-08-05 MED ORDER — GLUCOSE BLOOD VI STRP: 1.0000 | ORAL_STRIP | Freq: Every day | 12 refills | Status: DC

## 2018-08-05 MED ORDER — ACCU-CHEK SOFTCLIX LANCETS MISC: 1.0000 | Freq: Every day | 12 refills | Status: DC

## 2018-08-05 NOTE — Progress Notes (Signed)
Patient ID: Desiree Sanchez, female    DOB: July 10, 1950  MRN: [id number]  CC: Diabetes and Hypertension   Subjective: Desiree Sanchez is a 68 y.o. female who presents for chronic ds management Her concerns today include:  Pt with hx of HTN, HL, DM, CKD stage 3, insomnia (on Ambien), non-obstructing CAD (cath 08/2016, for med management), RT CTS, gout, IDA  Had a paper cut on tip of LT middle finger 1 yr ago.  Had some swelling of the finger at that time but it eventually went away.  1 mth ago, it was swollen and painful again for 3 wks.  Pain resolve but still a little swollen.  Better with wrapping tape around the finger.  Indocin helped a little.  Did not take Colchicine because she was not sure if it was gout.  No redness  HTN nonobstructing CAD:  No meter to check BP.  Has cut back on salt in the foods.  She denies any chest pain, shortness of breath, lower extremity edema, dizziness or chronic headaches.  Reports compliance with medications that include BiDil, carvedilol, aspirin and atorvastatin  DM:  glucometer no longer works. Compliant with Metformin Does well with eating habits Goes to [facility] 2- 3 x a wk to swim and sometimes walks 1 mile on treadmill Patient Active Problem List   Diagnosis Date Noted  . Primary osteoarthritis of both knees 10/29/2017  . CAD (coronary artery disease) 02/08/2017  . Insomnia 11/06/2016  . Iron deficiency anemia 09/14/2016  . Shoulder pain, left 02/11/2016  . Right carpal tunnel syndrome 07/29/2014  . Vitamin D insufficiency 04/13/2014  . Obesity (BMI 30-39.9) 03/13/2013  . Gout 05/20/2009  . HEART MURMUR, SYSTOLIC 94/70/9628  . DM2 (diabetes mellitus, type 2) ([place]) 04/22/2009  . Essential hypertension 04/22/2009     Current Outpatient Medications on File Prior to Visit  Medication Sig Dispense Refill  . albuterol (PROVENTIL HFA;VENTOLIN HFA) 108 (90 Base) MCG/ACT inhaler Inhale 2 puffs into the lungs every 6 (six) hours as needed for  wheezing or shortness of breath. 3 Inhaler 2  . aspirin EC 81 MG tablet Take 1 tablet (81 mg total) by mouth daily. 90 tablet 3  . atorvastatin (LIPITOR) 10 MG tablet Take 1 tablet (10 mg total) by mouth daily. 90 tablet 3  . Blood Pressure KIT 1 each by Does not apply route daily. 1 each 0  . carvedilol (COREG) 25 MG tablet Take 1 tablet (25 mg total) by mouth 2 (two) times daily with a meal. 180 tablet 3  . colchicine 0.6 MG tablet Take 1 tablet (0.6 mg total) by mouth 2 (two) times daily as needed (gout flare). 180 tablet 0  . diclofenac sodium (VOLTAREN) 1 % GEL Apply 2 g topically 4 (four) times daily. 100 g 3  . gabapentin (NEURONTIN) 100 MG capsule TAKE 3 CAPSULES AT BEDTIME 270 capsule 3  . indomethacin (INDOCIN) 50 MG capsule TAKE 1 CAPSULE TWICE DAILY AS NEEDED FOR ACUTE GOUT ATTACKS 30 capsule 1  . isosorbide-hydrALAZINE (BIDIL) 20-37.5 MG tablet Take 1 tablet by mouth 3 (three) times daily. 270 tablet 11  . loratadine (CLARITIN) 10 MG tablet Take 1 tablet (10 mg total) by mouth daily as needed for allergies. 90 tablet 2  . metFORMIN (GLUCOPHAGE-XR) 500 MG 24 hr tablet Take 2 tablets (1,000 mg total) by mouth daily with breakfast. 180 tablet 3  . Triamcinolone Acetonide (TRIAMCINOLONE 0.1 % CREAM : EUCERIN) CREA Apply 1 application topically 2 (two) times  daily as needed. Please mix 1:1 (Patient taking differently: Apply 1 application topically 2 (two) times daily as needed. Please mix 1:1) 1 each 3   No current facility-administered medications on file prior to visit.     Allergies  Allergen Reactions  . Lisinopril Swelling    REACTION: Swolen lips (only)  . Hctz [Hydrochlorothiazide] Other (See Comments)    Joint pain   . Naproxen Itching and Palpitations  . Norvasc [Amlodipine Besylate] Other (See Comments)    Peripheral edema     Social History   Socioeconomic History  . Marital status: Single    Spouse name: Not on file  . Number of children: Not on file  . Years of  education: Not on file  . Highest education level: Not on file  Occupational History  . Not on file  Social Needs  . Financial resource strain: Not on file  . Food insecurity:    Worry: Not on file    Inability: Not on file  . Transportation needs:    Medical: Not on file    Non-medical: Not on file  Tobacco Use  . Smoking status: Former Smoker    Last attempt to quit: 2009    Years since quitting: 11.1  . Smokeless tobacco: Never Used  Substance and Sexual Activity  . Alcohol use: Yes    Comment: occasionally  . Drug use: No  . Sexual activity: Not on file  Lifestyle  . Physical activity:    Days per week: Not on file    Minutes per session: Not on file  . Stress: Not on file  Relationships  . Social connections:    Talks on phone: Not on file    Gets together: Not on file    Attends religious service: Not on file    Active member of club or organization: Not on file    Attends meetings of clubs or organizations: Not on file    Relationship status: Not on file  . Intimate partner violence:    Fear of current or ex partner: Not on file    Emotionally abused: Not on file    Physically abused: Not on file    Forced sexual activity: Not on file  Other Topics Concern  . Not on file  Social History Narrative  . Not on file    Family History  Problem Relation Age of Onset  . Diabetes Mother   . Diabetes Sister     Past Surgical History:  Procedure Laterality Date  . APPENDECTOMY    . INTRAVASCULAR PRESSURE WIRE/FFR STUDY N/A 09/07/2016   Procedure: Intravascular Pressure Wire/FFR Study;  Surgeon: Nelva Bush, MD;  Location: [facility];  Service: Cardiovascular;  Laterality: N/A;  . LEFT HEART CATH AND CORONARY ANGIOGRAPHY N/A 09/07/2016   Procedure: Left Heart Cath and Coronary Angiography;  Surgeon: Nelva Bush, MD;  Location: [facility];  Service: Cardiovascular;  Laterality: N/A;  . TUBAL LIGATION      ROS: Review of Systems Negative  except as stated above  PHYSICAL EXAM: BP 130/79   Pulse 68   Temp 98.2 F (36.8 C) (Oral)   Resp 16   Ht 5' 10.5" (1.791 m)   Wt 250 lb 3.2 oz (113.5 kg)   SpO2 98%   BMI 35.39 kg/m   Wt Readings from Last 3 Encounters:  08/05/18 250 lb 3.2 oz (113.5 kg)  03/25/18 254 lb 3.2 oz (115.3 kg)  10/29/17 249 lb 3.2 oz (  113 kg)   Physical Exam General appearance - alert, well appearing, and in no distress Mental status - normal mood, behavior, speech, dress, motor activity, and thought processes Eyes - pupils equal and reactive, extraocular eye movements intact Neck - supple, no significant adenopathy Chest - clear to auscultation, no wheezes, rales or rhonchi, symmetric air entry Heart - normal rate, regular rhythm, normal S1, S2, no murmurs, rubs, clicks or gallops Musculoskeletal -left hand: Mild swelling of the middle finger between the MCP and PIP joint.  No warmth or redness.  No point tenderness.  She has good range of motion of the MCP and the PIP joints Extremities -no lower extremity edema  Results for orders placed or performed in visit on 08/05/18  POCT glucose (manual entry)  Result Value Ref Range   POC Glucose 111 (A) 70 - 99 mg/dl  POCT glycosylated hemoglobin (Hb A1C)  Result Value Ref Range   Hemoglobin A1C     HbA1c POC (<> result, manual entry)     HbA1c, POC (prediabetic range)     HbA1c, POC (controlled diabetic range) 7.6 (A) 0.0 - 7.0 %    CMP Latest Ref Rng & Units 10/29/2017 11/06/2016 09/14/2016  Glucose 65 - 99 mg/dL 137(H) 188(H) 102(H)  BUN 8 - 27 mg/dL 16 21 25   Creatinine 0.57 - 1.00 mg/dL 1.25(H) 1.26(H) 1.29(H)  Sodium 134 - 144 mmol/L 141 143 139  Potassium 3.5 - 5.2 mmol/L 4.4 4.5 5.1  Chloride 96 - 106 mmol/L 108(H) 108(H) 104  CO2 20 - 29 mmol/L 16(L) 21 17(L)  Calcium 8.7 - 10.3 mg/dL 9.6 9.5 10.0  Total Protein 6.0 - 8.5 g/dL 7.2 - -  Total Bilirubin 0.0 - 1.2 mg/dL 0.5 - -  Alkaline Phos 39 - 117 IU/L 71 - -  AST 0 - 40 IU/L 17 - -    ALT 0 - 32 IU/L 11 - -   Lipid Panel     Component Value Date/Time   CHOL 139 10/29/2017 1511   TRIG 85 10/29/2017 1511   HDL 50 10/29/2017 1511   CHOLHDL 2.8 10/29/2017 1511   CHOLHDL 3.5 09/05/2016 0228   VLDL 22 09/05/2016 0228   LDLCALC 72 10/29/2017 1511    CBC    Component Value Date/Time   WBC 6.7 10/29/2017 1511   WBC 5.4 09/08/2016 0252   RBC 4.16 10/29/2017 1511   RBC 3.11 (L) 09/08/2016 0252   HGB 12.0 10/29/2017 1511   HCT 36.1 10/29/2017 1511   PLT 273 10/29/2017 1511   MCV 87 10/29/2017 1511   MCH 28.8 10/29/2017 1511   MCH 28.3 09/08/2016 0252   MCHC 33.2 10/29/2017 1511   MCHC 31.9 09/08/2016 0252   RDW 15.1 10/29/2017 1511   LYMPHSABS 2.6 04/22/2009 2257   MONOABS 0.6 04/22/2009 2257   EOSABS 0.1 04/22/2009 2257   BASOSABS 0.0 04/22/2009 2257    ASSESSMENT AND PLAN: 1. Uncontrolled type 2 diabetes mellitus with complication, without long-term current use of insulin ([facility]) A1c has increased one-point above what it was on last visit.  We discussed increasing metformin.  Patient want to hold off on doing this.  She wants to work on getting it back down herself by working on eating habits and continued regular exercise.  If it is still above goal on follow-up visit we will plan to increase at that time.  Prescription for diabetic testing supplies sent to the pharmacy - POCT glucose (manual entry) - POCT glycosylated hemoglobin (Hb A1C) -  Blood Glucose Monitoring Suppl (ACCU-CHEK AVIVA PLUS) w/Device KIT; 1 Device by Does not apply route daily. ICD 10 E11.21  Dispense: 1 kit; Refill: 0 - ACCU-CHEK SOFTCLIX LANCETS lancets; 1 each by Other route daily. ICD 10 E11.21  Dispense: 100 each; Refill: 12 - glucose blood (ACCU-CHEK AVIVA PLUS) test strip; 1 each by Other route daily. ICD 10 E11.21  Dispense: 100 each; Refill: 12  2. Essential hypertension At goal.  Continue current medications  3. Arthritis of finger of left hand Patient declined referral for  x-ray.  I recommend using the Voltaren gel as needed  4. Coronary artery disease involving native coronary artery of native heart without angina pectoris Clinically stable.  Continue carvedilol, Lipitor, BiDil and aspirin  5. Obesity (BMI 30-39.9) See #1 above   Patient was given the opportunity to ask questions.  Patient verbalized understanding of the plan and was able to repeat key elements of the plan.   Orders Placed This Encounter  Procedures  . POCT glucose (manual entry)  . POCT glycosylated hemoglobin (Hb A1C)     Requested Prescriptions   Signed Prescriptions Disp Refills  . triamcinolone cream (KENALOG) 0.1 % 60 g 1    Sig: APPLY TOPICALLY TWICE DAILY As needed.  . Blood Glucose Monitoring Suppl (ACCU-CHEK AVIVA PLUS) w/Device KIT 1 kit 0    Sig: 1 Device by Does not apply route daily. ICD 10 E11.21  . ACCU-CHEK SOFTCLIX LANCETS lancets 100 each 12    Sig: 1 each by Other route daily. ICD 10 E11.21  . glucose blood (ACCU-CHEK AVIVA PLUS) test strip 100 each 12    Sig: 1 each by Other route daily. ICD 10 E11.21    Return in about 3 months (around 11/03/2018).  Karle Plumber, MD, FACP

## 2018-08-05 NOTE — Patient Instructions (Signed)
I have sent the prescription to the pharmacy for glucometer and diabetic testing supplies. Your A1c is 7.6.  Continue to work on improving healthy eating habits.  Continue regular exercise.  You can use the Voltaren gel for the arthritis on the left finger.

## 2018-11-03 ENCOUNTER — Ambulatory Visit: Payer: Medicare HMO | Attending: Internal Medicine | Admitting: Internal Medicine

## 2018-11-15 ENCOUNTER — Ambulatory Visit: Payer: Medicare HMO | Attending: Internal Medicine | Admitting: Internal Medicine

## 2018-11-15 ENCOUNTER — Other Ambulatory Visit: Payer: Self-pay

## 2018-12-29 ENCOUNTER — Other Ambulatory Visit: Payer: Self-pay | Admitting: Internal Medicine

## 2018-12-29 DIAGNOSIS — I2510 Atherosclerotic heart disease of native coronary artery without angina pectoris: Secondary | ICD-10-CM

## 2018-12-29 DIAGNOSIS — I10 Essential (primary) hypertension: Secondary | ICD-10-CM

## 2018-12-30 ENCOUNTER — Other Ambulatory Visit: Payer: Self-pay | Admitting: Internal Medicine

## 2018-12-30 DIAGNOSIS — I2510 Atherosclerotic heart disease of native coronary artery without angina pectoris: Secondary | ICD-10-CM

## 2018-12-30 DIAGNOSIS — E118 Type 2 diabetes mellitus with unspecified complications: Secondary | ICD-10-CM

## 2018-12-30 DIAGNOSIS — Z8739 Personal history of other diseases of the musculoskeletal system and connective tissue: Secondary | ICD-10-CM

## 2019-02-08 ENCOUNTER — Other Ambulatory Visit: Payer: Self-pay | Admitting: Internal Medicine

## 2019-02-08 DIAGNOSIS — M170 Bilateral primary osteoarthritis of knee: Secondary | ICD-10-CM

## 2019-02-09 ENCOUNTER — Ambulatory Visit: Payer: Medicare HMO | Attending: Internal Medicine | Admitting: Pharmacist

## 2019-02-09 ENCOUNTER — Ambulatory Visit: Payer: Medicare HMO

## 2019-02-09 ENCOUNTER — Other Ambulatory Visit: Payer: Self-pay

## 2019-02-09 DIAGNOSIS — Z23 Encounter for immunization: Secondary | ICD-10-CM | POA: Diagnosis not present

## 2019-02-09 NOTE — Progress Notes (Signed)
Patient presents for vaccination against influenza per orders of Dr. Johnson. Consent given. Counseling provided. No contraindications exists. Vaccine administered without incident.   

## 2019-03-12 ENCOUNTER — Other Ambulatory Visit: Payer: Self-pay | Admitting: Internal Medicine

## 2019-03-12 DIAGNOSIS — Z1231 Encounter for screening mammogram for malignant neoplasm of breast: Secondary | ICD-10-CM

## 2019-03-13 ENCOUNTER — Telehealth: Payer: Self-pay | Admitting: Internal Medicine

## 2019-03-13 NOTE — Telephone Encounter (Signed)
-----   Message from Ladell Pier, MD sent at 03/12/2019  5:23 PM EDT ----- Regarding: pt over due for f/u appt.  Please give appt the end of October

## 2019-03-13 NOTE — Telephone Encounter (Signed)
Called the pt and there was no answer lvm for them to call back for an appointment.

## 2019-03-29 ENCOUNTER — Other Ambulatory Visit: Payer: Self-pay | Admitting: Internal Medicine

## 2019-03-29 DIAGNOSIS — Z8739 Personal history of other diseases of the musculoskeletal system and connective tissue: Secondary | ICD-10-CM

## 2019-03-29 DIAGNOSIS — M170 Bilateral primary osteoarthritis of knee: Secondary | ICD-10-CM

## 2019-05-19 ENCOUNTER — Ambulatory Visit: Payer: Medicare HMO | Attending: Internal Medicine | Admitting: Internal Medicine

## 2019-05-19 ENCOUNTER — Other Ambulatory Visit: Payer: Self-pay

## 2019-05-19 DIAGNOSIS — G5601 Carpal tunnel syndrome, right upper limb: Secondary | ICD-10-CM

## 2019-05-19 DIAGNOSIS — I10 Essential (primary) hypertension: Secondary | ICD-10-CM | POA: Diagnosis not present

## 2019-05-19 DIAGNOSIS — E118 Type 2 diabetes mellitus with unspecified complications: Secondary | ICD-10-CM | POA: Diagnosis not present

## 2019-05-19 DIAGNOSIS — Z8739 Personal history of other diseases of the musculoskeletal system and connective tissue: Secondary | ICD-10-CM | POA: Diagnosis not present

## 2019-05-19 DIAGNOSIS — I2510 Atherosclerotic heart disease of native coronary artery without angina pectoris: Secondary | ICD-10-CM

## 2019-05-19 DIAGNOSIS — Z1231 Encounter for screening mammogram for malignant neoplasm of breast: Secondary | ICD-10-CM

## 2019-05-19 MED ORDER — LORATADINE 10 MG PO TABS: 10.0000 mg | ORAL_TABLET | Freq: Every day | ORAL | 2 refills | Status: AC | PRN

## 2019-05-19 MED ORDER — ISOSORB DINITRATE-HYDRALAZINE 20-37.5 MG PO TABS: 1.0000 | ORAL_TABLET | Freq: Three times a day (TID) | ORAL | 3 refills | Status: DC

## 2019-05-19 MED ORDER — METFORMIN HCL ER 500 MG PO TB24: 1000.0000 mg | ORAL_TABLET | Freq: Every day | ORAL | 3 refills | Status: DC

## 2019-05-19 MED ORDER — INDOMETHACIN 50 MG PO CAPS: ORAL_CAPSULE | ORAL | 1 refills | Status: DC

## 2019-05-19 MED ORDER — ATORVASTATIN CALCIUM 10 MG PO TABS: 10.0000 mg | ORAL_TABLET | Freq: Every day | ORAL | 3 refills | Status: DC

## 2019-05-19 MED ORDER — CARVEDILOL 25 MG PO TABS: 25.0000 mg | ORAL_TABLET | Freq: Two times a day (BID) | ORAL | 3 refills | Status: DC

## 2019-05-19 MED ORDER — GABAPENTIN 100 MG PO CAPS: ORAL_CAPSULE | ORAL | 3 refills | Status: DC

## 2019-05-19 NOTE — Progress Notes (Signed)
Virtual Visit via Telephone Note Due to current restrictions/limitations of in-office visits due to the COVID-19 pandemic, this scheduled clinical appointment was converted to a telehealth visit  I connected with Desiree Sanchez on 05/19/19 at 2:07 p.m by telephone and verified that I am speaking with the correct person using two identifiers. I am in my office.  The patient is at home.  Only the patient and myself participated in this encounter.  I discussed the limitations, risks, security and privacy concerns of performing an evaluation and management service by telephone and the availability of in person appointments. I also discussed with the patient that there may be a patient responsible charge related to this service. The patient expressed understanding and agreed to proceed.   History of Present Illness: Pt with hx of HTN, HL, DM, CKD stage 3, insomnia non-obstructing CAD (cath 08/2016, for med management), RT CTS, gout, IDA.  Last evaluated 07/2018.  DM:  BS range 80-100.  Checking once a wk -reports eating habits are good Doing light wgh and Yoga.  [facility]. Over due for eye exam. Plans to call and schedule  HTN/CAD: compliant with meds. Checks BP at friends house.  Last check was 120/80 No HA/dizziness/CP/LE edema.  She limits salt in the foods.  Gout:  No recent flares.   HL: Reports compliance with atorvastatin.  Outpatient Encounter Medications as of 05/19/2019  Medication Sig  . ACCU-CHEK SOFTCLIX LANCETS lancets 1 each by Other route daily. ICD 10 E11.21  . albuterol (PROVENTIL HFA;VENTOLIN HFA) 108 (90 Base) MCG/ACT inhaler Inhale 2 puffs into the lungs every 6 (six) hours as needed for wheezing or shortness of breath.  Marland Kitchen aspirin EC 81 MG tablet Take 1 tablet (81 mg total) by mouth daily.  Marland Kitchen atorvastatin (LIPITOR) 10 MG tablet Take 1 tablet (10 mg total) by mouth daily.  . Blood Glucose Monitoring Suppl (ACCU-CHEK AVIVA PLUS)  w/Device KIT 1 Device by Does not apply route daily. ICD 10 E11.21  . Blood Pressure KIT 1 each by Does not apply route daily.  . carvedilol (COREG) 25 MG tablet TAKE 1 TABLET TWICE DAILY WITH MEALS  . colchicine 0.6 MG tablet Take 1 tablet (0.6 mg total) by mouth 2 (two) times daily as needed (gout flare).  . diclofenac sodium (VOLTAREN) 1 % GEL APPLY 2 GRAMS TOPICALLY 4 (FOUR) TIMES DAILY.  Marland Kitchen gabapentin (NEURONTIN) 100 MG capsule TAKE 3 CAPSULES AT BEDTIME  . glucose blood (ACCU-CHEK AVIVA PLUS) test strip 1 each by Other route daily. ICD 10 E11.21  . indomethacin (INDOCIN) 50 MG capsule TAKE 1 CAPSULE TWICE DAILY AS NEEDED FOR ACUTE GOUT ATTACKS  . isosorbide-hydrALAZINE (BIDIL) 20-37.5 MG tablet Take 1 tablet by mouth 3 (three) times daily.  Marland Kitchen loratadine (CLARITIN) 10 MG tablet Take 1 tablet (10 mg total) by mouth daily as needed for allergies.  . metFORMIN (GLUCOPHAGE-XR) 500 MG 24 hr tablet Take 2 tablets (1,000 mg total) by mouth daily with breakfast.  . Triamcinolone Acetonide (TRIAMCINOLONE 0.1 % CREAM : EUCERIN) CREA Apply 1 application topically 2 (two) times daily as needed. Please mix 1:1 (Patient taking differently: Apply 1 application topically 2 (two) times daily as needed. Please mix 1:1)  . triamcinolone cream (KENALOG) 0.1 % APPLY TOPICALLY TWICE DAILY AS NEEDED.   No facility-administered encounter medications on file as of 05/19/2019.    Observations/Objective: No direct observation done as this was a telephone encounter.  Assessment and Plan:  1. Controlled type 2 diabetes mellitus with complication, without long-term current use of insulin ([place]) Encourage patient to check the blood sugar at least 3 times a week.  She will continue Metformin.  Encouraged her to continue healthy eating habits and regular exercise.  She will come to the lab sometime next week to have her routine blood test done. - metFORMIN (GLUCOPHAGE-XR) 500 MG 24 hr tablet; Take 2 tablets (1,000 mg total)  by mouth daily with breakfast.  Dispense: 180 tablet; Refill: 3 - Microalbumin/Creatinine Ratio, Urine; Future - Hemoglobin A1c(blood test); Future - CBC; Future - Comprehensive metabolic panel; Future - Lipid panel; Future  2. Essential hypertension At goal.  Continue current medications and low-salt diet - carvedilol (COREG) 25 MG tablet; Take 1 tablet (25 mg total) by mouth 2 (two) times daily with a meal.  Dispense: 180 tablet; Refill: 3 - isosorbide-hydrALAZINE (BIDIL) 20-37.5 MG tablet; Take 1 tablet by mouth 3 (three) times daily.  Dispense: 270 tablet; Refill: 3  3. Coronary artery disease involving native coronary artery of native heart without angina pectoris Stable without any reports of chest pains.  Continue atorvastatin, carvedilol and aspirin - atorvastatin (LIPITOR) 10 MG tablet; Take 1 tablet (10 mg total) by mouth daily.  Dispense: 90 tablet; Refill: 3 - carvedilol (COREG) 25 MG tablet; Take 1 tablet (25 mg total) by mouth 2 (two) times daily with a meal.  Dispense: 180 tablet; Refill: 3 - isosorbide-hydrALAZINE (BIDIL) 20-37.5 MG tablet; Take 1 tablet by mouth 3 (three) times daily.  Dispense: 270 tablet; Refill: 3  4. Right carpal tunnel syndrome Patient requested refill on gabapentin - gabapentin (NEURONTIN) 100 MG capsule; TAKE 3 CAPSULES AT BEDTIME  Dispense: 270 capsule; Refill: 3  5. History of gout No recent flare.  She takes Indocin sparingly and only when she has a flareup - indomethacin (INDOCIN) 50 MG capsule; TAKE 1 CAPSULE TWICE DAILY AS NEEDED FOR ACUTE GOUT ATTACKS  Dispense: 30 capsule; Refill: 1  6. Encounter for screening mammogram for malignant neoplasm of breast - MM Digital Screening; Future  Follow Up Instructions: F/u in 3-4 mths   I discussed the assessment and treatment plan with the patient. The patient was provided an opportunity to ask questions and all were answered. The patient agreed with the plan and demonstrated an understanding of  the instructions.   The patient was advised to call back or seek an in-person evaluation if the symptoms worsen or if the condition fails to improve as anticipated.  I provided 9 minutes of non-face-to-face time during this encounter.   Karle Plumber, MD

## 2019-05-19 NOTE — Progress Notes (Signed)
Pt states her blood sugar this morning was 89  Pt is needing all medications refilled

## 2019-09-03 ENCOUNTER — Other Ambulatory Visit: Payer: Self-pay | Admitting: Internal Medicine

## 2019-09-03 DIAGNOSIS — Z8739 Personal history of other diseases of the musculoskeletal system and connective tissue: Secondary | ICD-10-CM

## 2019-09-11 ENCOUNTER — Inpatient Hospital Stay: Admission: RE | Admit: 2019-09-11 | Payer: Medicare HMO | Source: Ambulatory Visit

## 2019-09-18 NOTE — Progress Notes (Deleted)
Cardiology Office Note:    Date:  09/18/2019   ID:  Desiree, Sanchez [birth year], MRN [id number]  PCP:  Ladell Pier, MD  Cardiologist:  Peter Martinique, MD   Referring MD: Ladell Pier, MD   No chief complaint on file. ***  History of Present Illness:    Desiree Sanchez is a 69 y.o. female with a hx of hypertension, diabetes mellitus type 2, prior tobacco abuse, and CAD.  She presented to the ER in 08/2016 with chest pain.  Echocardiogram at that time with preserved EF, grade 1 diastolic dysfunction, PA peak pressure 36 mmHg.  Myoview obtained 09/06/2016 was read as low risk, but could not rule out inferior MI versus diaphragmatic attenuation.  Patient eventually underwent cardiac catheterization on 09/07/2016 which showed nonobstructive CAD with 40% distal LAD stenosis, 30% OM1 lesion, 60% distal RCA with negative FFR.  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  Medical therapy was recommended.  She was last seen clinic on 04/07/2017 and was doing well at that time.  She did complain of chronic cough with unclear etiology.  She was recently seen by PCP 05/2019 and was doing well.   CAD-nonobstructive by heart cath 09/2016 - Continue medical therapy   Hyperlipidemia with LDL goal less than 70 - Continue 10 mg Lipitor - Repeat fasting lipids   Hypertension - Continue beta-blocker  DM2 - Per PCP - on Metformin    Past Medical History:  Diagnosis Date  . Arthritis Dx 2006  . Carpal tunnel syndrome   . Diabetes mellitus without complication ([place]) Dx [id number]  . Dyspnea   . Gout   . Heart murmur   . Hypertension Dx 2005  . Unstable angina ([place]) 08/2016    Past Surgical History:  Procedure Laterality Date  . APPENDECTOMY    . INTRAVASCULAR PRESSURE WIRE/FFR STUDY N/A 09/07/2016   Procedure: Intravascular Pressure Wire/FFR Study;  Surgeon: Nelva Bush, MD;  Location: [facility];  Service: Cardiovascular;  Laterality: N/A;  . LEFT  HEART CATH AND CORONARY ANGIOGRAPHY N/A 09/07/2016   Procedure: Left Heart Cath and Coronary Angiography;  Surgeon: Nelva Bush, MD;  Location: [facility];  Service: Cardiovascular;  Laterality: N/A;  . TUBAL LIGATION      Current Medications: No outpatient medications have been marked as taking for the 09/19/19 encounter (Appointment) with Ledora Sanchez, [place].     Allergies:   Lisinopril, Hctz [hydrochlorothiazide], Naproxen, and Norvasc [amlodipine besylate]   Social History   Socioeconomic History  . Marital status: Single    Spouse name: Not on file  . Number of children: Not on file  . Years of education: Not on file  . Highest education level: Not on file  Occupational History  . Not on file  Tobacco Use  . Smoking status: Former Smoker    Quit date: 2009    Years since quitting: 12.2  . Smokeless tobacco: Never Used  Substance and Sexual Activity  . Alcohol use: Yes    Comment: occasionally  . Drug use: No  . Sexual activity: Not on file  Other Topics Concern  . Not on file  Social History Narrative  . Not on file   Social Determinants of Health   Financial Resource Strain:   . Difficulty of Paying Living Expenses:   Food Insecurity:   . Worried About Charity fundraiser in the Last Year:   . Arboriculturist in  the Last Year:   Transportation Needs:   . Film/video editor (Medical):   Marland Kitchen Lack of Transportation (Non-Medical):   Physical Activity:   . Days of Exercise per Week:   . Minutes of Exercise per Session:   Stress:   . Feeling of Stress :   Social Connections:   . Frequency of Communication with Friends and Family:   . Frequency of Social Gatherings with Friends and Family:   . Attends Religious Services:   . Active Member of Clubs or Organizations:   . Attends Archivist Meetings:   Marland Kitchen Marital Status:      Family History: The patient's ***family history includes Diabetes in her mother and sister.  ROS:   Please  see the history of present illness.    All other systems reviewed and are negative.  EKGs/Labs/Other Studies Reviewed:    The following studies were reviewed today:  Left heart cath 09/2016: Conclusions: 1. Non-obstructive coronary artery disease, including 40% distal LAD stenosis, 30% OM1 lesion, and 60% distal RCA stenosis (FFR 0.94). Question degree of myocardial bridging at 40% distal LAD lesion. 2. Normal left ventricular filling pressure. 3. Systemic hypertension.  Recommendation: 1. Medical therapy; will increase carvedilol to 25 mg BID to maximize heart rate and blood pressure control. 2. Risk factor modification and medical therapy to prevent progression of CAD.   Echo 09/2016: Study Conclusions   - Left ventricle: Distal septal hypokinesis The cavity size was  mildly dilated. Wall thickness was normal. Systolic function was  normal. The estimated ejection fraction was in the range of 50%  to 55%. Wall motion was normal; there were no regional wall  motion abnormalities. Doppler parameters are consistent with  abnormal left ventricular relaxation (grade 1 diastolic  dysfunction).  - Atrial septum: No defect or patent foramen ovale was identified.  - Pulmonary arteries: PA peak pressure: 36 mm Hg (S).    EKG:  EKG is *** ordered today.  The ekg ordered today demonstrates ***  Recent Labs: No results found for requested labs within last 8760 hours.  Recent Lipid Panel    Component Value Date/Time   CHOL 139 10/29/2017 1511   TRIG 85 10/29/2017 1511   HDL 50 10/29/2017 1511   CHOLHDL 2.8 10/29/2017 1511   CHOLHDL 3.5 09/05/2016 0228   VLDL 22 09/05/2016 0228   LDLCALC 72 10/29/2017 1511    Physical Exam:    VS:  There were no vitals taken for this visit.    Wt Readings from Last 3 Encounters:  08/05/18 250 lb 3.2 oz (113.5 kg)  03/25/18 254 lb 3.2 oz (115.3 kg)  10/29/17 249 lb 3.2 oz (113 kg)     GEN: *** Well nourished, well developed in no  acute distress HEENT: Normal NECK: No JVD; No carotid bruits LYMPHATICS: No lymphadenopathy CARDIAC: ***RRR, no murmurs, rubs, gallops RESPIRATORY:  Clear to auscultation without rales, wheezing or rhonchi  ABDOMEN: Soft, non-tender, non-distended MUSCULOSKELETAL:  No edema; No deformity  SKIN: Warm and dry NEUROLOGIC:  Alert and oriented x 3 PSYCHIATRIC:  Normal affect   ASSESSMENT:    No diagnosis found. PLAN:    In order of problems listed above:  No diagnosis found.   Medication Adjustments/Labs and Tests Ordered: Current medicines are reviewed at length with the patient today.  Concerns regarding medicines are outlined above.  No orders of the defined types were placed in this encounter.  No orders of the defined types were placed in this encounter.  Signed, Ledora Bottcher, PA  09/18/2019 4:05 PM    [facility]

## 2019-09-19 ENCOUNTER — Ambulatory Visit: Payer: Medicare HMO | Admitting: Physician Assistant

## 2019-09-22 ENCOUNTER — Ambulatory Visit: Payer: Medicare HMO | Admitting: Internal Medicine

## 2019-09-26 ENCOUNTER — Ambulatory Visit: Payer: Medicare HMO | Admitting: Physician Assistant

## 2019-09-26 ENCOUNTER — Ambulatory Visit: Payer: Medicare HMO | Admitting: Internal Medicine

## 2019-09-27 ENCOUNTER — Other Ambulatory Visit: Payer: Self-pay | Admitting: Internal Medicine

## 2019-09-29 ENCOUNTER — Telehealth: Payer: Self-pay

## 2019-09-29 ENCOUNTER — Telehealth (INDEPENDENT_AMBULATORY_CARE_PROVIDER_SITE_OTHER): Payer: Medicare HMO | Admitting: Physician Assistant

## 2019-09-29 ENCOUNTER — Encounter: Payer: Self-pay | Admitting: Physician Assistant

## 2019-09-29 DIAGNOSIS — I2510 Atherosclerotic heart disease of native coronary artery without angina pectoris: Secondary | ICD-10-CM | POA: Diagnosis not present

## 2019-09-29 DIAGNOSIS — I10 Essential (primary) hypertension: Secondary | ICD-10-CM

## 2019-09-29 DIAGNOSIS — Z87891 Personal history of nicotine dependence: Secondary | ICD-10-CM | POA: Diagnosis not present

## 2019-09-29 DIAGNOSIS — E785 Hyperlipidemia, unspecified: Secondary | ICD-10-CM

## 2019-09-29 DIAGNOSIS — E119 Type 2 diabetes mellitus without complications: Secondary | ICD-10-CM | POA: Diagnosis not present

## 2019-09-29 NOTE — Telephone Encounter (Signed)
Called patient to discuss AVS instructions gave Hao Meng's recommendations and patient voiced understanding. AVS summary mailed to patient.    

## 2019-09-29 NOTE — Telephone Encounter (Signed)
  Patient Consent for Virtual Visit         Desiree Sanchez has provided verbal consent on 09/29/2019 for a virtual visit (video or telephone).   CONSENT FOR VIRTUAL VISIT FOR:  Desiree Sanchez  By participating in this virtual visit I agree to the following:  I hereby voluntarily request, consent and authorize [facility] and its employed or contracted physicians, physician assistants, nurse practitioners or other licensed health care professionals (the Practitioner), to provide me with telemedicine health care services (the "Services") as deemed necessary by the treating Practitioner. I acknowledge and consent to receive the Services by the Practitioner via telemedicine. I understand that the telemedicine visit will involve communicating with the Practitioner through live audiovisual communication technology and the disclosure of certain medical information by electronic transmission. I acknowledge that I have been given the opportunity to request an in-person assessment or other available alternative prior to the telemedicine visit and am voluntarily participating in the telemedicine visit.  I understand that I have the right to withhold or withdraw my consent to the use of telemedicine in the course of my care at any time, without affecting my right to future care or treatment, and that the Practitioner or I may terminate the telemedicine visit at any time. I understand that I have the right to inspect all information obtained and/or recorded in the course of the telemedicine visit and may receive copies of available information for a reasonable fee.  I understand that some of the potential risks of receiving the Services via telemedicine include:  Marland Kitchen Delay or interruption in medical evaluation due to technological equipment failure or disruption; . Information transmitted may not be sufficient (e.g. poor resolution of images) to allow for appropriate medical decision making by the Practitioner;  and/or  . In rare instances, security protocols could fail, causing a breach of personal health information.  Furthermore, I acknowledge that it is my responsibility to provide information about my medical history, conditions and care that is complete and accurate to the best of my ability. I acknowledge that Practitioner's advice, recommendations, and/or decision may be based on factors not within their control, such as incomplete or inaccurate data provided by me or distortions of diagnostic images or specimens that may result from electronic transmissions. I understand that the practice of medicine is not an exact science and that Practitioner makes no warranties or guarantees regarding treatment outcomes. I acknowledge that a copy of this consent can be made available to me via my patient portal ([facility]), or I can request a printed copy by calling the office of [facility].    I understand that my insurance will be billed for this visit.   I have read or had this consent read to me. . I understand the contents of this consent, which adequately explains the benefits and risks of the Services being provided via telemedicine.  . I have been provided ample opportunity to ask questions regarding this consent and the Services and have had my questions answered to my satisfaction. . I give my informed consent for the services to be provided through the use of telemedicine in my medical care

## 2019-09-29 NOTE — Progress Notes (Signed)
Virtual Visit via Telephone Note   This visit type was conducted due to national recommendations for restrictions regarding the COVID-19 Pandemic (e.g. social distancing) in an effort to limit this patient's exposure and mitigate transmission in our community.  Due to her co-morbid illnesses, this patient is at least at moderate risk for complications without adequate follow up.  This format is felt to be most appropriate for this patient at this time.  The patient did not have access to video technology/had technical difficulties with video requiring transitioning to audio format only (telephone).  All issues noted in this document were discussed and addressed.  No physical exam could be performed with this format.  Please refer to the patient's chart for her  consent to telehealth for [facility].   The patient was identified using 2 identifiers.  Date:  10/02/2019   ID:  [name], [name] Oct 18, 1950, MRN [id number]  Patient Location: Home Provider Location: Office  PCP:  Ladell Pier, MD  Cardiologist:  Peter Martinique, MD  Electrophysiologist:  None   Evaluation Performed:  Follow-Up Visit  Chief Complaint:  followup  History of Present Illness:    Desiree Sanchez is a 69 y.o. female with PMH of HTN, DM II, prior tobacco abuse and CAD. She presented to the hospital on 09/04/2016 with chest pain. She has a family history of CAD as well with her mother died of MI at age 36. Initial point-of-care troponin was negative, however troponin I was 0.2, unclear if elevation was due to high blood pressure or coronary artery disease. Echocardiogram obtained on 09/05/2016 showed EF 84-69%, grade 1 diastolic dysfunction, PA peak pressure 36 mmHg. Myoview obtained on 09/06/2016 came back low risk study, EF 55-65%, however it cannot rule out inferior MI versus diaphragmatic attenuation with small area of apical ischemia. Patient eventually underwent cardiac catheterization on 09/07/2016, this showed  nonobstructive CAD with only 40% distal LAD stenosis, 30% OM1 lesion, 60% distal RCA with negative FFR of 0.94, questionable myocardial bridging with 40% distal LAD lesion. Medical therapy was recommended. Carvedilol was increased to 25 mg twice a day to maximize heart rate and blood pressure control. Lipitor 10 mg was added prior to discharge given the mild coronary artery disease.   Patient presents today for cardiology office visit.  She denies any recent chest pain, shortness of breath, lower extremity edema, orthopnea or PND.  She is planning to see her PCP next Tuesday and have lab work drawn.  Otherwise she has been doing well from cardiology perspective and can follow-up with Dr. Martinique in 6 to 8 months.  The patient does not have symptoms concerning for COVID-19 infection (fever, chills, cough, or new shortness of breath).    Past Medical History:  Diagnosis Date  . Arthritis Dx 2006  . Carpal tunnel syndrome   . Diabetes mellitus without complication ([place]) Dx 6295  . Dyspnea   . Gout   . Heart murmur   . Hypertension Dx 2005  . Unstable angina ([place]) 08/2016   Past Surgical History:  Procedure Laterality Date  . APPENDECTOMY    . INTRAVASCULAR PRESSURE WIRE/FFR STUDY N/A 09/07/2016   Procedure: Intravascular Pressure Wire/FFR Study;  Surgeon: Nelva Bush, MD;  Location: [facility];  Service: Cardiovascular;  Laterality: N/A;  . LEFT HEART CATH AND CORONARY ANGIOGRAPHY N/A 09/07/2016   Procedure: Left Heart Cath and Coronary Angiography;  Surgeon: Nelva Bush, MD;  Location: [facility];  Service: Cardiovascular;  Laterality:  N/A;  . TUBAL LIGATION       Current Meds  Medication Sig  . ACCU-CHEK SOFTCLIX LANCETS lancets 1 each by Other route daily. ICD 10 E11.21  . albuterol (PROVENTIL HFA;VENTOLIN HFA) 108 (90 Base) MCG/ACT inhaler Inhale 2 puffs into the lungs every 6 (six) hours as needed for wheezing or shortness of breath.  Marland Kitchen aspirin EC 81 MG tablet  Take 1 tablet (81 mg total) by mouth daily.  Marland Kitchen atorvastatin (LIPITOR) 10 MG tablet Take 1 tablet (10 mg total) by mouth daily.  . Blood Glucose Monitoring Suppl (ACCU-CHEK AVIVA PLUS) w/Device KIT 1 Device by Does not apply route daily. ICD 10 E11.21  . Blood Pressure KIT 1 each by Does not apply route daily.  . carvedilol (COREG) 25 MG tablet Take 1 tablet (25 mg total) by mouth 2 (two) times daily with a meal.  . colchicine 0.6 MG tablet Take 1 tablet (0.6 mg total) by mouth 2 (two) times daily as needed (gout flare).  . diclofenac sodium (VOLTAREN) 1 % GEL APPLY 2 GRAMS TOPICALLY 4 (FOUR) TIMES DAILY.  Marland Kitchen gabapentin (NEURONTIN) 100 MG capsule TAKE 3 CAPSULES AT BEDTIME (Patient taking differently: TAKE 3 CAPSULES AS NEEDED AT BEDTIME)  . glucose blood (ACCU-CHEK AVIVA PLUS) test strip 1 each by Other route daily. ICD 10 E11.21  . indomethacin (INDOCIN) 50 MG capsule TAKE 1 CAPSULE TWICE DAILY AS NEEDED FOR ACUTE GOUT ATTACKS  . isosorbide-hydrALAZINE (BIDIL) 20-37.5 MG tablet Take 1 tablet by mouth 3 (three) times daily.  Marland Kitchen loratadine (CLARITIN) 10 MG tablet Take 1 tablet (10 mg total) by mouth daily as needed for allergies.  . metFORMIN (GLUCOPHAGE-XR) 500 MG 24 hr tablet Take 2 tablets (1,000 mg total) by mouth daily with breakfast.  . Triamcinolone Acetonide (TRIAMCINOLONE 0.1 % CREAM : EUCERIN) CREA Apply 1 application topically 2 (two) times daily as needed. Please mix 1:1 (Patient taking differently: Apply 1 application topically 2 (two) times daily as needed. Please mix 1:1)  . triamcinolone cream (KENALOG) 0.1 % APPLY TOPICALLY TWICE DAILY AS NEEDED.     Allergies:   Lisinopril, Hctz [hydrochlorothiazide], Naproxen, and Norvasc [amlodipine besylate]   Social History   Tobacco Use  . Smoking status: Former Smoker    Quit date: 2009    Years since quitting: 12.3  . Smokeless tobacco: Never Used  Substance Use Topics  . Alcohol use: Yes    Comment: occasionally  . Drug use: No       Family Hx: The patient's family history includes Diabetes in her mother and sister.  ROS:   Please see the history of present illness.     All other systems reviewed and are negative.   Prior CV studies:   The following studies were reviewed today:  Echo 09/05/2016 LV EF: 50% -  55%   -------------------------------------------------------------------  Indications:   Dyspnea 786.09.   -------------------------------------------------------------------  Study Conclusions   - Left ventricle: Distal septal hypokinesis The cavity size was  mildly dilated. Wall thickness was normal. Systolic function was  normal. The estimated ejection fraction was in the range of 50%  to 55%. Wall motion was normal; there were no regional wall  motion abnormalities. Doppler parameters are consistent with  abnormal left ventricular relaxation (grade 1 diastolic  dysfunction).  - Atrial septum: No defect or patent foramen ovale was identified.  - Pulmonary arteries: PA peak pressure: 36 mm Hg (S).    Cath 09/07/2016 Conclusions: 1. Non-obstructive coronary artery disease, including 40%  distal LAD stenosis, 30% OM1 lesion, and 60% distal RCA stenosis (FFR 0.94). Question degree of myocardial bridging at 40% distal LAD lesion. 2. Normal left ventricular filling pressure. 3. Systemic hypertension.  Recommendation: 1. Medical therapy; will increase carvedilol to 25 mg BID to maximize heart rate and blood pressure control. 2. Risk factor modification and medical therapy to prevent progression of CAD.    Labs/Other Tests and Data Reviewed:    EKG:  An ECG dated 09/08/2016 was personally reviewed today and demonstrated:  Normal sinus rhythm without significant ST-T wave changes  Recent Labs: No results found for requested labs within last 8760 hours.   Recent Lipid Panel Lab Results  Component Value Date/Time   CHOL 139 10/29/2017 03:11 PM   TRIG 85 10/29/2017 03:11 PM    HDL 50 10/29/2017 03:11 PM   CHOLHDL 2.8 10/29/2017 03:11 PM   CHOLHDL 3.5 09/05/2016 02:28 AM   LDLCALC 72 10/29/2017 03:11 PM    Wt Readings from Last 3 Encounters:  08/05/18 250 lb 3.2 oz (113.5 kg)  03/25/18 254 lb 3.2 oz (115.3 kg)  10/29/17 249 lb 3.2 oz (113 kg)     Objective:    Vital Signs:  There were no vitals taken for this visit.   VITAL SIGNS:  reviewed  ASSESSMENT & PLAN:    1. CAD: Denies any recent chest pain.  Continue aspirin, Lipitor and carvedilol.  Nonobstructive CAD noted on the previous cardiac catheterization in 2018  2. Hypertension: Continue on current therapy  3. Hyperlipidemia: On Lipitor.  She has upcoming PCP visit and will have lab work done during that visit.  4. DM2: Managed by primary care provider.  COVID-19 Education: The signs and symptoms of COVID-19 were discussed with the patient and how to seek care for testing (follow up with PCP or arrange E-visit).  The importance of social distancing was discussed today.  Time:   Today, I have spent 10 minutes with the patient with telehealth technology discussing the above problems.     Medication Adjustments/Labs and Tests Ordered: Current medicines are reviewed at length with the patient today.  Concerns regarding medicines are outlined above.   Tests Ordered: No orders of the defined types were placed in this encounter.   Medication Changes: No orders of the defined types were placed in this encounter.   Follow Up:  Either In Person or Virtual in 6 month(s)  Signed, Almyra Deforest, [place]  10/02/2019 12:01 AM    [place]

## 2019-09-29 NOTE — Patient Instructions (Signed)
Medication Instructions:  Your physician recommends that you continue on your current medications as directed. Please refer to the Current Medication list given to you today.  *If you need a refill on your cardiac medications before your next appointment, please call your pharmacy*  Lab Work: NONE ordered at this time of appointment. Labs are deferred to Primary Care Physician.  If you have labs (blood work) drawn today and your tests are completely normal, you will receive your results only by: Marland Kitchen MyChart Message (if you have MyChart) OR . A paper copy in the mail If you have any lab test that is abnormal or we need to change your treatment, we will call you to review the results.  Testing/Procedures: NONE ordered at this time of appointment   Follow-Up: At [facility], you and your health needs are our priority.  As part of our continuing mission to provide you with exceptional heart care, we have created designated Provider Care Teams.  These Care Teams include your primary Cardiologist (physician) and Advanced Practice Providers (APPs -  Physician Assistants and Nurse Practitioners) who all work together to provide you with the care you need, when you need it.  Your next appointment:   6-8 month(s)  The format for your next appointment:   In Person  Provider:   Peter Martinique, MD  Other Instructions

## 2019-10-03 ENCOUNTER — Other Ambulatory Visit: Payer: Self-pay

## 2019-10-03 ENCOUNTER — Encounter: Payer: Self-pay | Admitting: Internal Medicine

## 2019-10-03 ENCOUNTER — Ambulatory Visit: Payer: Medicare HMO | Attending: Internal Medicine | Admitting: Internal Medicine

## 2019-10-03 DIAGNOSIS — L989 Disorder of the skin and subcutaneous tissue, unspecified: Secondary | ICD-10-CM | POA: Diagnosis not present

## 2019-10-03 DIAGNOSIS — I10 Essential (primary) hypertension: Secondary | ICD-10-CM | POA: Diagnosis not present

## 2019-10-03 DIAGNOSIS — Z1231 Encounter for screening mammogram for malignant neoplasm of breast: Secondary | ICD-10-CM

## 2019-10-03 DIAGNOSIS — I2510 Atherosclerotic heart disease of native coronary artery without angina pectoris: Secondary | ICD-10-CM | POA: Diagnosis not present

## 2019-10-03 DIAGNOSIS — Z8739 Personal history of other diseases of the musculoskeletal system and connective tissue: Secondary | ICD-10-CM

## 2019-10-03 DIAGNOSIS — E118 Type 2 diabetes mellitus with unspecified complications: Secondary | ICD-10-CM

## 2019-10-03 DIAGNOSIS — Z780 Asymptomatic menopausal state: Secondary | ICD-10-CM

## 2019-10-03 DIAGNOSIS — M170 Bilateral primary osteoarthritis of knee: Secondary | ICD-10-CM

## 2019-10-03 MED ORDER — INDOMETHACIN 50 MG PO CAPS: ORAL_CAPSULE | ORAL | 1 refills | Status: DC

## 2019-10-03 MED ORDER — ISOSORB DINITRATE-HYDRALAZINE 20-37.5 MG PO TABS: 1.0000 | ORAL_TABLET | Freq: Three times a day (TID) | ORAL | 3 refills | Status: DC

## 2019-10-03 MED ORDER — DICLOFENAC SODIUM 1 % EX GEL: 2.0000 g | Freq: Four times a day (QID) | CUTANEOUS | 6 refills | Status: DC

## 2019-10-03 NOTE — Progress Notes (Signed)
Pt states her blood sugar was 103 this morning

## 2019-10-03 NOTE — Progress Notes (Signed)
Virtual Visit via Telephone Note Due to current restrictions/limitations of in-office visits due to the COVID-19 pandemic, this scheduled clinical appointment was converted to a telehealth visit  I connected with Desiree Sanchez on 10/03/19 at 8:33 a.m by telephone and verified that I am speaking with the correct person using two identifiers. I am in my office.  The patient is at home.  Only the patient and myself participated in this encounter.  I discussed the limitations, risks, security and privacy concerns of performing an evaluation and management service by telephone and the availability of in person appointments. I also discussed with the patient that there may be a patient responsible charge related to this service. The patient expressed understanding and agreed to proceed.   History of Present Illness: Pt with hx of HTN, HL, DM, CKD stage 3, insomnia non-obstructing CAD (cath 08/2016, for med management), RT CTS,gout, IDA. Last visit 05/2019.  HM: forgot to come to lab to have blood tests done. Plans to come to lab next Monday.  Had [place] vaccine series done in Feb. I have documented this on her immunization records today.  Did not get MMG as yet.  Schedule off because she goes to her son's house to help watch her grandchildren for virtual school learning  C/o having a growth on her scalp.  We spoke about referral to derm in past but pt states she was not financially able. No increase in size but tender when she combs hair.  She is now in a position where she is able to move forward with a dermatology referral.   DIABETES TYPE 2 Last A1C:   Results for orders placed or performed in visit on 08/05/18  POCT glucose (manual entry)  Result Value Ref Range   POC Glucose 111 (A) 70 - 99 mg/dl  POCT glycosylated hemoglobin (Hb A1C)  Result Value Ref Range   Hemoglobin A1C     HbA1c POC (<> result, manual entry)     HbA1c, POC (prediabetic range)     HbA1c, POC (controlled  diabetic range) 7.6 (A) 0.0 - 7.0 %   BS this wkend was 102 Med Adherence:  [x]  Yes    []  No Medication side effects:  []  Yes    [x]  No Home Monitoring?  [x]  Yes   2-3 x a wk Home glucose results range: 89-120 fasting Diet Adherence: [x]  Yes - eating a lot of fresh fruits and veggies, cut back on consumption of sodas     Exercise: [x]  Yes -goes to [facility] 2-3 x a wk.  Does treadmill and swim Hypoglycemic episodes?: []  Yes    [x]  No Numbness of the feet? []  Yes    [x]  No Retinopathy hx? []  Yes    []  No Last eye exam:  Did not have eye exam as yet.  She is agreeable to referral to the ophthalmologist no blurred vision. Comments: no recent wgh.   HYPERTENSION/CAD Currently taking: see medication list.  Recently saw cardiology earlier this month.  No changes made in medications. Med Adherence: [x]  Yes    []  No Medication side effects: []  Yes    [x]  No Adherence with salt restriction: [x]  Yes    []  No Home Monitoring?: []  Yes    [x]  No  Monitoring Frequency: []  Yes    []  No Home BP results range: 128/70 a few wks ago when she was at a friend's house who had a device SOB? []  Yes    [x]  No Chest Pain?: []   Yes    [x]  No Leg swelling?: []  Yes    [x]  No Headaches?: []  Yes    [x]  No Dizziness? []  Yes    [x]  No Comments:   Needs RF on Bidil, Voltaren Gel and Indocin.  No recent flare of gout. Outpatient Encounter Medications as of 10/03/2019  Medication Sig  . ACCU-CHEK SOFTCLIX LANCETS lancets 1 each by Other route daily. ICD 10 E11.21  . albuterol (PROVENTIL HFA;VENTOLIN HFA) 108 (90 Base) MCG/ACT inhaler Inhale 2 puffs into the lungs every 6 (six) hours as needed for wheezing or shortness of breath.  Marland Kitchen aspirin EC 81 MG tablet Take 1 tablet (81 mg total) by mouth daily.  Marland Kitchen atorvastatin (LIPITOR) 10 MG tablet Take 1 tablet (10 mg total) by mouth daily.  . Blood Glucose Monitoring Suppl (ACCU-CHEK AVIVA PLUS) w/Device KIT 1 Device by Does not apply route daily. ICD 10 E11.21  . Blood Pressure  KIT 1 each by Does not apply route daily.  . carvedilol (COREG) 25 MG tablet Take 1 tablet (25 mg total) by mouth 2 (two) times daily with a meal.  . colchicine 0.6 MG tablet Take 1 tablet (0.6 mg total) by mouth 2 (two) times daily as needed (gout flare).  . diclofenac sodium (VOLTAREN) 1 % GEL APPLY 2 GRAMS TOPICALLY 4 (FOUR) TIMES DAILY.  Marland Kitchen gabapentin (NEURONTIN) 100 MG capsule TAKE 3 CAPSULES AT BEDTIME (Patient taking differently: TAKE 3 CAPSULES AS NEEDED AT BEDTIME)  . glucose blood (ACCU-CHEK AVIVA PLUS) test strip 1 each by Other route daily. ICD 10 E11.21  . indomethacin (INDOCIN) 50 MG capsule TAKE 1 CAPSULE TWICE DAILY AS NEEDED FOR ACUTE GOUT ATTACKS  . isosorbide-hydrALAZINE (BIDIL) 20-37.5 MG tablet Take 1 tablet by mouth 3 (three) times daily.  Marland Kitchen loratadine (CLARITIN) 10 MG tablet Take 1 tablet (10 mg total) by mouth daily as needed for allergies.  . metFORMIN (GLUCOPHAGE-XR) 500 MG 24 hr tablet Take 2 tablets (1,000 mg total) by mouth daily with breakfast.  . Triamcinolone Acetonide (TRIAMCINOLONE 0.1 % CREAM : EUCERIN) CREA Apply 1 application topically 2 (two) times daily as needed. Please mix 1:1 (Patient taking differently: Apply 1 application topically 2 (two) times daily as needed. Please mix 1:1)  . triamcinolone cream (KENALOG) 0.1 % APPLY TOPICALLY TWICE DAILY AS NEEDED.   No facility-administered encounter medications on file as of 10/03/2019.    Observations/Objective: No direct observation done as this was a telephone encounter. Depression screen [facility] 2/9 10/03/2019 08/05/2018 03/25/2018  Decreased Interest 0 0 0  Down, Depressed, Hopeless 0 0 0  PHQ - 2 Score 0 0 0  Altered sleeping - - -  Tired, decreased energy - - -  Change in appetite - - -  Feeling bad or failure about yourself  - - -  Trouble concentrating - - -  Moving slowly or fidgety/restless - - -  Suicidal thoughts - - -  PHQ-9 Score - - -     Assessment and Plan: 1. Controlled type 2 diabetes  mellitus with complication, without long-term current use of insulin ([place]) Reported blood sugar readings are at goal.  She will continue Metformin. Commended her and encouraged her to continue healthy eating habits and regular exercise. Overdue for A1c.  She will come to the lab to have blood test done as were ordered on last visit - Ambulatory referral to Ophthalmology  2. Essential hypertension Reported recent blood pressure check is at goal.  She will continue current blood pressure medications  including BiDil, carvedilol.  Continue DASH diet - isosorbide-hydrALAZINE (BIDIL) 20-37.5 MG tablet; Take 1 tablet by mouth 3 (three) times daily.  Dispense: 270 tablet; Refill: 3  3. Coronary artery disease involving native coronary artery of native heart without angina pectoris Clinically stable.  Continue BiDil, carvedilol, aspirin, and atorvastatin - isosorbide-hydrALAZINE (BIDIL) 20-37.5 MG tablet; Take 1 tablet by mouth 3 (three) times daily.  Dispense: 270 tablet; Refill: 3  4. History of gout - indomethacin (INDOCIN) 50 MG capsule; TAKE 1 CAPSULE TWICE DAILY AS NEEDED FOR ACUTE GOUT ATTACKS  Dispense: 30 capsule; Refill: 1  5. Encounter for screening mammogram for malignant neoplasm of breast - MM Digital Screening; Future  6. Postmenopausal estrogen deficiency We discussed screening for osteoporosis.  Patient is willing to have bone density study done. - DG Bone Density; Future  7. Primary osteoarthritis of both knees - diclofenac Sodium (VOLTAREN) 1 % GEL; Apply 2 g topically 4 (four) times daily.  Dispense: 100 g; Refill: 6  8. Scalp lesion - Ambulatory referral to Dermatology   Follow Up Instructions: 3 mth in person   I discussed the assessment and treatment plan with the patient. The patient was provided an opportunity to ask questions and all were answered. The patient agreed with the plan and demonstrated an understanding of the instructions.   The patient was advised to  call back or seek an in-person evaluation if the symptoms worsen or if the condition fails to improve as anticipated.  I provided 15 minutes of non-face-to-face time during this encounter.   Karle Plumber, MD

## 2019-10-09 ENCOUNTER — Other Ambulatory Visit: Payer: Self-pay

## 2019-10-09 ENCOUNTER — Ambulatory Visit: Payer: Medicare HMO | Attending: Internal Medicine

## 2019-10-09 DIAGNOSIS — E118 Type 2 diabetes mellitus with unspecified complications: Secondary | ICD-10-CM | POA: Diagnosis not present

## 2019-10-10 LAB — CBC
Hematocrit: 34.7 % (ref 34.0–46.6)
Hemoglobin: 11.8 g/dL (ref 11.1–15.9)
MCH: 30.2 pg (ref 26.6–33.0)
MCHC: 34 g/dL (ref 31.5–35.7)
MCV: 89 fL (ref 79–97)
Platelets: 263 10*3/uL (ref 150–450)
RBC: 3.91 x10E6/uL (ref 3.77–5.28)
RDW: 13.2 % (ref 11.7–15.4)
WBC: 5.5 10*3/uL (ref 3.4–10.8)

## 2019-10-10 LAB — COMPREHENSIVE METABOLIC PANEL
ALT: 10 IU/L (ref 0–32)
AST: 12 IU/L (ref 0–40)
Albumin/Globulin Ratio: 1.3 (ref 1.2–2.2)
Albumin: 4 g/dL (ref 3.7–4.7)
Alkaline Phosphatase: 74 IU/L (ref 39–117)
BUN/Creatinine Ratio: 10 — ABNORMAL LOW (ref 12–28)
BUN: 11 mg/dL (ref 8–27)
Bilirubin Total: 0.3 mg/dL (ref 0.0–1.2)
CO2: 18 mmol/L — ABNORMAL LOW (ref 20–29)
Calcium: 9.3 mg/dL (ref 8.7–10.3)
Chloride: 108 mmol/L — ABNORMAL HIGH (ref 96–106)
Creatinine, Ser: 1.11 mg/dL — ABNORMAL HIGH (ref 0.57–1.00)
GFR calc Af Amer: 58 mL/min/{1.73_m2} — ABNORMAL LOW (ref >59)
GFR calc non Af Amer: 50 mL/min/{1.73_m2} — ABNORMAL LOW (ref >59)
Globulin, Total: 3 g/dL (ref 1.5–4.5)
Glucose: 153 mg/dL — ABNORMAL HIGH (ref 65–99)
Potassium: 4.2 mmol/L (ref 3.5–5.2)
Sodium: 142 mmol/L (ref 134–144)
Total Protein: 7 g/dL (ref 6.0–8.5)

## 2019-10-10 LAB — HEMOGLOBIN A1C
Est. average glucose Bld gHb Est-mCnc: 137 mg/dL
Hgb A1c MFr Bld: 6.4 % — ABNORMAL HIGH (ref 4.8–5.6)

## 2019-10-10 LAB — LIPID PANEL
Chol/HDL Ratio: 3.1 ratio (ref 0.0–4.4)
Cholesterol, Total: 145 mg/dL (ref 100–199)
HDL: 47 mg/dL (ref >39)
LDL Chol Calc (NIH): 80 mg/dL (ref 0–99)
Triglycerides: 97 mg/dL (ref 0–149)
VLDL Cholesterol Cal: 18 mg/dL (ref 5–40)

## 2019-10-10 LAB — MICROALBUMIN / CREATININE URINE RATIO
Creatinine, Urine: 179.6 mg/dL
Microalb/Creat Ratio: 22 mg/g creat (ref 0–29)
Microalbumin, Urine: 40.1 ug/mL

## 2019-10-20 DIAGNOSIS — L720 Epidermal cyst: Secondary | ICD-10-CM | POA: Diagnosis not present

## 2019-11-13 DIAGNOSIS — L720 Epidermal cyst: Secondary | ICD-10-CM | POA: Diagnosis not present

## 2019-11-13 DIAGNOSIS — L7211 Pilar cyst: Secondary | ICD-10-CM | POA: Diagnosis not present

## 2019-11-18 ENCOUNTER — Other Ambulatory Visit: Payer: Self-pay | Admitting: Internal Medicine

## 2019-12-13 ENCOUNTER — Other Ambulatory Visit: Payer: Self-pay | Admitting: Internal Medicine

## 2019-12-13 ENCOUNTER — Ambulatory Visit: Payer: Medicare HMO

## 2019-12-13 ENCOUNTER — Other Ambulatory Visit: Payer: Medicare HMO

## 2019-12-13 DIAGNOSIS — M170 Bilateral primary osteoarthritis of knee: Secondary | ICD-10-CM

## 2019-12-13 NOTE — Telephone Encounter (Signed)
Requested Prescriptions  Pending Prescriptions Disp Refills  . diclofenac Sodium (VOLTAREN) 1 % GEL [Pharmacy Med Name: DICLOFENAC SODIUM 1 % Gel] 700 g     Sig: APPLY 2 GRAMS TOPICALLY FOUR TIMES DAILY     Analgesics:  Topicals Passed - 12/13/2019  4:30 AM      Passed - Valid encounter within last 12 months    Recent Outpatient Visits          2 months ago Controlled type 2 diabetes mellitus with complication, without long-term current use of insulin ([place])   [place], Neoma Laming B, MD   6 months ago Controlled type 2 diabetes mellitus with complication, without long-term current use of insulin ([place])   [place], Deborah B, MD   1 year ago Uncontrolled type 2 diabetes mellitus with complication, without long-term current use of insulin ([place])   [place], Deborah B, MD   1 year ago Essential hypertension   [place], Deborah B, MD   2 years ago Type 2 diabetes mellitus with complication, unspecified whether long term insulin use [facility])   [place], Deborah B, MD      Future Appointments            In 1 month Wynetta Emery, Dalbert Batman, MD [place]

## 2020-01-02 ENCOUNTER — Ambulatory Visit: Payer: Medicare HMO | Admitting: Family

## 2020-01-11 ENCOUNTER — Other Ambulatory Visit: Payer: Self-pay | Admitting: Internal Medicine

## 2020-01-22 ENCOUNTER — Other Ambulatory Visit: Payer: Self-pay

## 2020-01-22 ENCOUNTER — Encounter: Payer: Self-pay | Admitting: Family

## 2020-01-22 ENCOUNTER — Ambulatory Visit: Payer: Medicare HMO | Admitting: Family

## 2020-01-22 ENCOUNTER — Ambulatory Visit: Payer: Medicare HMO | Attending: Family | Admitting: Family

## 2020-01-22 ENCOUNTER — Encounter: Payer: Self-pay | Admitting: Pharmacist

## 2020-01-22 ENCOUNTER — Encounter: Payer: Medicare HMO | Admitting: Family

## 2020-01-22 ENCOUNTER — Ambulatory Visit (HOSPITAL_BASED_OUTPATIENT_CLINIC_OR_DEPARTMENT_OTHER): Payer: Medicare HMO | Admitting: Pharmacist

## 2020-01-22 VITALS — BP 127/78 | HR 70 | Temp 98.5°F | Resp 16 | Ht 68.0 in | Wt 244.0 lb

## 2020-01-22 DIAGNOSIS — Z0000 Encounter for general adult medical examination without abnormal findings: Secondary | ICD-10-CM

## 2020-01-22 DIAGNOSIS — M170 Bilateral primary osteoarthritis of knee: Secondary | ICD-10-CM

## 2020-01-22 DIAGNOSIS — Z23 Encounter for immunization: Secondary | ICD-10-CM | POA: Diagnosis not present

## 2020-01-22 MED ORDER — TRAMADOL HCL 50 MG PO TABS: 50.0000 mg | ORAL_TABLET | Freq: Every day | ORAL | 0 refills | Status: DC

## 2020-01-22 NOTE — Progress Notes (Signed)
Pt is requesting a rx for Tramadol

## 2020-01-22 NOTE — Progress Notes (Signed)
Patient presents for vaccination against tetanus per orders of Dr. Johnson. Consent given. Counseling provided. No contraindications exists. Vaccine administered without incident.  ° °Luke Van Ausdall, PharmD, CPP °Clinical Pharmacist °[facility] °[phone] ° °

## 2020-01-22 NOTE — Patient Instructions (Addendum)
Desiree Sanchez , Thank you for taking time to come for your Medicare Wellness Visit. I appreciate your ongoing commitment to your health goals. Please review the following plan we discussed and let me know if I can assist you in the future.   These are the goals we discussed: Goals    . Blood Pressure < 140/90    . HEMOGLOBIN A1C < 7.0       This is a list of the screening recommended for you and due dates:  Health Maintenance  Topic Date Due  . DEXA scan (bone density measurement)  Never done  . Complete foot exam   02/10/2017  . Mammogram  10/22/2018  . Eye exam for diabetics  01/14/2019  . Tetanus Vaccine  04/23/2019  . Flu Shot  01/07/2020  . Hemoglobin A1C  04/10/2020  . Urine Protein Check  10/08/2020  . Colon Cancer Screening  11/14/2023  . COVID-19 Vaccine  Completed  .  Hepatitis C: One time screening is recommended by Center for Disease Control  (CDC) for  adults born from [birth year] through 1967.   Completed  . Pneumonia vaccines  Completed    Td (Tetanus, Diphtheria) Vaccine: What You Need to Know 1. Why get vaccinated? Td vaccine can prevent tetanus and diphtheria. Tetanus enters the body through cuts or wounds. Diphtheria spreads from person to person.  TETANUS (T) causes painful stiffening of the muscles. Tetanus can lead to serious health problems, including being unable to open the mouth, having trouble swallowing and breathing, or death.  DIPHTHERIA (D) can lead to difficulty breathing, heart failure, paralysis, or death. 2. Td vaccine Td is only for children 7 years and older, adolescents, and adults.  Td is usually given as a booster dose every 10 years, but it can also be given earlier after a severe and dirty wound or burn. Another vaccine, called Tdap, that protects against pertussis, also known as "whooping cough," in addition to tetanus and diphtheria, may be used instead of Td.  Td may be given at the same time as other vaccines. 3. Talk with your health care  provider Tell your vaccine provider if the person getting the vaccine:  Has had an allergic reaction after a previous dose of any vaccine that protects against tetanus or diphtheria, or has any severe, life-threatening allergies.  Has ever had Guillain-Barr Syndrome (also called GBS).  Has had severe pain or swelling after a previous dose of any vaccine that protects against tetanus or diphtheria. In some cases, your health care provider may decide to postpone Td vaccination to a future visit.  People with minor illnesses, such as a cold, may be vaccinated. People who are moderately or severely ill should usually wait until they recover before getting Td vaccine.  Your health care provider can give you more information. 4. Risks of a vaccine reaction  Pain, redness, or swelling where the shot was given, mild fever, headache, feeling tired, and nausea, vomiting, diarrhea, or stomachache sometimes happen after Td vaccine. People sometimes faint after medical procedures, including vaccination. Tell your provider if you feel dizzy or have vision changes or ringing in the ears.  As with any medicine, there is a very remote chance of a vaccine causing a severe allergic reaction, other serious injury, or death. 5. What if there is a serious problem? An allergic reaction could occur after the vaccinated person leaves the clinic. If you see signs of a severe allergic reaction (hives, swelling of the face and throat,  difficulty breathing, a fast heartbeat, dizziness, or weakness), call 9-1-1 and get the person to the nearest hospital.  For other signs that concern you, call your health care provider.  Adverse reactions should be reported to the Vaccine Adverse Event Reporting System (VAERS). Your health care provider will usually file this report, or you can do it yourself. Visit the VAERS website at www.vaers.SamedayNews.es or call 351-097-7294. VAERS is only for reporting reactions, and VAERS staff do not  give medical advice. 6. The National Vaccine Injury Compensation Program The [employer] Vaccine Injury Compensation Program (VICP) is a federal program that was created to compensate people who may have been injured by certain vaccines. Visit the VICP website at GoldCloset.com.ee or call 931-887-7488 to learn about the program and about filing a claim. There is a time limit to file a claim for compensation. 7. How can I learn more?  Ask your health care provider.  Call your local or state health department.  Contact the Centers for Disease Control and Prevention (CDC): ? Call (940) 166-3407 (1-800-CDC-INFO) or ? Visit CDC's website at http://hunter.com/ Vaccine Information Statement Td Vaccine (09/07/18) This information is not intended to replace advice given to you by your health care provider. Make sure you discuss any questions you have with your health care provider. Document Revised: 10/17/2018 Document Reviewed: 09/19/2018 Elsevier Patient Education  [place].

## 2020-01-22 NOTE — Progress Notes (Addendum)
Subjective:   Desiree Sanchez is a 69 y.o. female who presents for an Initial Medicare Annual Wellness Visit.  Review of Systems     Objective:    Today's Vitals   01/22/20 1009  BP: 127/78  Pulse: 70  Resp: 16  Temp: 98.5 F (36.9 C)  SpO2: 96%  Weight: 244 lb (110.7 kg)  Height: _0  (1.727 m)   Body mass index is 37.1 kg/m.  Advanced Directives 01/22/2020 02/16/2017 11/06/2016 09/14/2016 09/04/2016 08/03/2016 06/16/2016  Does Patient Have a Medical Advance Directive? Yes _1  No  Type of Advance Directive [place] in Chart? No - copy requested - - - - - -  Would patient like information on creating a medical advance directive? - No - Patient declined - - No - Patient declined - -   Current Medications (verified) Outpatient Encounter Medications as of 01/22/2020  Medication Sig   albuterol (PROVENTIL HFA;VENTOLIN HFA) 108 (90 Base) MCG/ACT inhaler Inhale 2 puffs into the lungs every 6 (six) hours as needed for wheezing or shortness of breath.   aspirin EC 81 MG tablet Take 1 tablet (81 mg total) by mouth daily.   atorvastatin (LIPITOR) 10 MG tablet Take 1 tablet (10 mg total) by mouth daily.   carvedilol (COREG) 25 MG tablet Take 1 tablet (25 mg total) by mouth 2 (two) times daily with a meal.   colchicine 0.6 MG tablet Take 1 tablet (0.6 mg total) by mouth 2 (two) times daily as needed (gout flare).   diclofenac Sodium (VOLTAREN) 1 % GEL APPLY 2 GRAMS TOPICALLY FOUR TIMES DAILY   gabapentin (NEURONTIN) 100 MG capsule TAKE 3 CAPSULES AT BEDTIME (Patient taking differently: TAKE 3 CAPSULES AS NEEDED AT BEDTIME)   indomethacin (INDOCIN) 50 MG capsule TAKE 1 CAPSULE TWICE DAILY AS NEEDED FOR ACUTE GOUT ATTACKS   isosorbide-hydrALAZINE (BIDIL) 20-37.5 MG tablet Take 1 tablet by mouth 3 (three) times daily.   loratadine (CLARITIN) 10 MG tablet Take 1 tablet (10 mg total) by mouth daily as needed  for allergies.   metFORMIN (GLUCOPHAGE-XR) 500 MG 24 hr tablet Take 2 tablets (1,000 mg total) by mouth daily with breakfast.   Triamcinolone Acetonide (TRIAMCINOLONE 0.1 % CREAM : EUCERIN) CREA Apply 1 application topically 2 (two) times daily as needed. Please mix 1:1 (Patient taking differently: Apply 1 application topically 2 (two) times daily as needed. Please mix 1:1)   triamcinolone cream (KENALOG) 0.1 % APPLY TOPICALLY TWICE DAILY AS NEEDED.   ACCU-CHEK SOFTCLIX LANCETS lancets 1 each by Other route daily. ICD 10 E11.21   Blood Glucose Monitoring Suppl (ACCU-CHEK AVIVA PLUS) w/Device KIT 1 Device by Does not apply route daily. ICD 10 E11.21   Blood Pressure KIT 1 each by Does not apply route daily.   glucose blood (ACCU-CHEK AVIVA PLUS) test strip 1 each by Other route daily. ICD 10 E11.21   No facility-administered encounter medications on file as of 01/22/2020.   Allergies (verified) Lisinopril, Hctz [hydrochlorothiazide], Naproxen, and Norvasc [amlodipine besylate]   History: Past Medical History:  Diagnosis Date   Arthritis Dx 2006   Carpal tunnel syndrome    Diabetes mellitus without complication ([place]) Dx 3007   Dyspnea    Gout    Heart murmur    Hypertension Dx 2005   Unstable angina ([place]) 08/2016   Past Surgical History:  Procedure Laterality Date  APPENDECTOMY     INTRAVASCULAR PRESSURE WIRE/FFR STUDY N/A 09/07/2016   Procedure: Intravascular Pressure Wire/FFR Study;  Surgeon: Nelva Bush, MD;  Location: [facility];  Service: Cardiovascular;  Laterality: N/A;   LEFT HEART CATH AND CORONARY ANGIOGRAPHY N/A 09/07/2016   Procedure: Left Heart Cath and Coronary Angiography;  Surgeon: Nelva Bush, MD;  Location: [facility];  Service: Cardiovascular;  Laterality: N/A;   TUBAL LIGATION     Family History  Problem Relation Age of Onset   Diabetes Mother    Diabetes Sister    Social History   Socioeconomic History   Marital  status: Single    Spouse name: Not on file   Number of children: Not on file   Years of education: Not on file   Highest education level: Not on file  Occupational History   Not on file  Tobacco Use   Smoking status: Former Smoker    Quit date: 2009    Years since quitting: 12.6   Smokeless tobacco: Never Used  Substance and Sexual Activity   Alcohol use: Yes    Comment: occasionally   Drug use: No   Sexual activity: Not on file  Other Topics Concern   Not on file  Social History Narrative   Not on file   Social Determinants of Health   Financial Resource Strain:    Difficulty of Paying Living Expenses:   Food Insecurity:    Worried About Charity fundraiser in the Last Year:    Arboriculturist in the Last Year:   Transportation Needs:    Film/video editor (Medical):    Lack of Transportation (Non-Medical):   Physical Activity:    Days of Exercise per Week:    Minutes of Exercise per Session:   Stress:    Feeling of Stress :   Social Connections:    Frequency of Communication with Friends and Family:    Frequency of Social Gatherings with Friends and Family:    Attends Religious Services:    Active Member of Clubs or Organizations:    Attends Archivist Meetings:    Marital Status:    Tobacco Counseling Counseling given: Not Answered  Reports not currently smoking. Reports she did smoke for about 10 years around 3 cigarettes or less daily.   Clinical Intake: Pain : No/denies pain  Diabetes: Yes CBG done?: No  Diabetic? Yes, last visit 10/03/2019 with Dr. Wynetta Emery. Counseled patient to keep all follow-up appointments with primary physician as scheduled. Patient verbalized understanding.  Activities of Daily Living In your present state of health, do you have any difficulty performing the following activities: 01/22/2020  Hearing? N  Vision? N  Difficulty concentrating or making decisions? N  Walking or climbing stairs?  N  Dressing or bathing? N  Doing errands, shopping? N  Preparing Food and eating ? N  Using the Toilet? N  In the past six months, have you accidently leaked urine? N  Do you have problems with loss of bowel control? N  Managing your Medications? N  Managing your Finances? N  Housekeeping or managing your Housekeeping? N  Some recent data might be hidden    Patient Care Team: Ladell Pier, MD as PCP - General (Internal Medicine) [place], Peter M, MD as PCP - Cardiology (Cardiology)  Clent Jacks, MD as Consulting Physician (Ophthalmology)   Indicate any recent Medical Services you may have received from other than [facility] providers in the past  year (date may be approximate).   Assessment:   This is a routine wellness examination for [place].  Hearing/Vision screen  Hearing Screening   _0  _1  _2  _3  _4  _5  _6  _7  _8   Right ear:           Left ear:             Visual Acuity Screening   Right eye Left eye Both eyes  Without correction:     With correction: 20 40 20 40 20 40    Reports scheduled September 2021 with Dr. Katy Fitch. Reports wearing prescriptive lenses for distance and also wearing readers.   Dietary issues and exercise activities discussed: -  fruit, yogurt, vegetables, salads, pie or cake on occasionally, tends to eat more baked food  -  electrolyte water, regular soda on occasion  - denies alcohol consumption  Goals     Blood Pressure < 140/90     HEMOGLOBIN A1C < 7.0       Depression Screen PHQ 2/9 Scores 01/22/2020 10/03/2019 08/05/2018 03/25/2018 10/29/2017 06/14/2017 02/16/2017  PHQ - 2 Score 0 0 0 0 0 0 0  PHQ- 9 Score - - - - - - -    Fall Risk Fall Risk  01/22/2020 10/03/2019 05/19/2019 08/05/2018 10/29/2017  Falls in the past year? 0 0 0 0 No  Number falls in past yr: 0 - - - -  Injury with Fall? 0 - - - -   Any stairs in or around the home? Yes , outside steps 6 in back and 2 in front. No steps inside the home. If  so, are there any without handrails? No Home free of loose throw rugs in walkways, pet beds, electrical cords, etc? No, does have throw rugs but they are flat and do not slide and placed on top of wall-to-wall rug.  Adequate lighting in your home to reduce risk of falls? Yes   ASSISTIVE DEVICES UTILIZED TO PREVENT FALLS: Life alert? No   Use of a cane, walker or w/c? sometimes if gout becomes severe than will use cane  Grab bars in the bathroom? No  reports she has some which she ordered from [facility] she just needs someone to come to the home and place them  Shower chair or bench in shower? No  Elevated toilet seat or a handicapped toilet? No   TIMED UP AND GO: Was the test performed? Yes .  Length of time to ambulate 10 feet: 10 sec.   Gait steady and fast without use of assistive device  Cognitive Function: MMSE - Mini Mental State Exam 01/22/2020  Orientation to time 5  Orientation to Place 5  Registration 3  Attention/ Calculation 5  Recall 3  Language- name 2 objects 2  Language- repeat 1  Language- follow 3 step command 3  Language- read & follow direction 1  Write a sentence 1  Copy design 0  Total score 29    Immunizations Immunization History  Administered Date(s) Administered   Influenza Split 03/13/2013   Influenza Whole 04/22/2009   Influenza,inj,Quad PF,6+ Mos 07/09/2015, 02/11/2016, 02/16/2017, 03/25/2018, 02/09/2019   PFIZER SARS-COV-2 Vaccination 07/13/2019, 08/03/2019   Pneumococcal Conjugate-13 02/16/2017   Pneumococcal Polysaccharide-23 04/22/2009, 01/03/2015   Td 04/22/2009    TDAP status: Patient wishes to receive this vaccine today after disclosing that insurance does not cover the vaccine as a preventative vaccine.   Flu Vaccine status: Up to date Pneumococcal vaccine status: Up to date Covid-19 vaccine status: Completed vaccines  Qualifies for Shingles Vaccine? No    Screening Tests Health Maintenance  Topic Date Due   DEXA SCAN   Never done   FOOT EXAM  02/10/2017   MAMMOGRAM  10/22/2018   OPHTHALMOLOGY EXAM  01/14/2019   TETANUS/TDAP  04/23/2019   INFLUENZA VACCINE  01/07/2020   HEMOGLOBIN A1C  04/10/2020   URINE MICROALBUMIN  10/08/2020   COLONOSCOPY  11/14/2023   COVID-19 Vaccine  Completed   Hepatitis C Screening  Completed   PNA vac Low Risk Adult  Completed    Health Maintenance  Health Maintenance Due  Topic Date Due   DEXA SCAN  Never done   FOOT EXAM  02/10/2017   MAMMOGRAM  10/22/2018   OPHTHALMOLOGY EXAM  01/14/2019   TETANUS/TDAP  04/23/2019   INFLUENZA VACCINE  01/07/2020    Colorectal cancer screening: Completed 2015. Repeat every 10 years Mammogram status: Completed 2018. Repeat every year patient scheduled for repeat 03/01/2020 Bone Density status: Ordered previously. Pt provided with contact info and advised to call to schedule appt.scheduled for 03/01/2020  Lung Cancer Screening: (Low Dose CT Chest recommended if Age 68-80 years, 30 pack-year currently smoking OR have quit w/in 15years.) does not qualify.   Lung Cancer Screening Referral: not applicable  Additional Screening:  Hepatitis C Screening: does not qualify; Completed 07/09/2015  Vision Screening: Recommended annual ophthalmology exams for early detection of glaucoma and other disorders of the eye. Is the patient up to date with their annual eye exam?  Sheduled for September 2021 Who is the provider or what is the name of the office in which the patient attends annual eye exams? Dr. Katy Fitch If pt is not established with a provider, would they like to be referred to a provider to establish care? not applicable.   Dental Screening: Recommended annual dental exams for proper oral hygiene  Community Resource Referral / Chronic Care Management: CRR required this visit?  No   CCM required this visit?  No    Tramadol does help with joint pain, health insurance says 14 tablets for 7 days, joint pain  (degenerative arthritis). Hands and doing a lot of arts and crafts.     Plan:  1. Encounter for Medicare annual wellness exam: - Patient presents today for Medicare annual wellness exam.  - Scheduled for mammogram and DEXA scan on 03/01/2020.  2. Need for Tdap vaccination: - Tetanus/TDAP vaccine administered today.  - Tdap vaccine greater than or equal to 7yo IM  3. Primary osteoarthritis of both knees: - Patient with chronic primary osteoarthritis of both knees.  - Tramadol for osteoarthritis of both knees. Tramadol may cause drowsiness. Counseled patient to not consume if operating heavy machinery or driving. Counseled patient to not consume with alcohol. Patient verbalized understanding. - I did check the [facility] prescription drug database and found no frequent prescribers of opiates or evidence of aberrant behavior. - Follow-up with primary physician on 02/02/2020. - traMADol (ULTRAM) 50 MG tablet; Take 1 tablet (50 mg total) by mouth at bedtime for 15 days.  Dispense: 15 tablet; Refill: 0  I have personally reviewed and noted the following in the patients chart:    Medical and social history  Use of alcohol, tobacco or illicit drugs   Current medications and supplements  Functional ability and status  Nutritional status  Physical activity  Advanced directives  List of other physicians  Hospitalizations, surgeries, and ER visits in previous 12 months  Vitals  Screenings to include cognitive, depression, and falls  Referrals and appointments  In addition, I have reviewed and discussed with patient certain preventive protocols, quality metrics, and best practice recommendations. A written personalized care plan for preventive services as well as general preventive health recommendations were provided to patient.    Camillia Herter, NP   01/22/2020

## 2020-01-23 MED FILL — traMADol HCL 50 MG TABS: 50 | 7 days supply | Qty: 7 | Fill #0

## 2020-02-02 ENCOUNTER — Encounter: Payer: Self-pay | Admitting: Internal Medicine

## 2020-02-02 ENCOUNTER — Ambulatory Visit: Payer: Medicare HMO | Attending: Internal Medicine | Admitting: Internal Medicine

## 2020-02-02 ENCOUNTER — Other Ambulatory Visit: Payer: Self-pay

## 2020-02-02 VITALS — BP 125/71 | HR 71 | Temp 98.3°F | Resp 16 | Wt 237.8 lb

## 2020-02-02 DIAGNOSIS — E1169 Type 2 diabetes mellitus with other specified complication: Secondary | ICD-10-CM

## 2020-02-02 DIAGNOSIS — Z23 Encounter for immunization: Secondary | ICD-10-CM | POA: Diagnosis not present

## 2020-02-02 DIAGNOSIS — N1831 Chronic kidney disease, stage 3a: Secondary | ICD-10-CM | POA: Diagnosis not present

## 2020-02-02 DIAGNOSIS — I2510 Atherosclerotic heart disease of native coronary artery without angina pectoris: Secondary | ICD-10-CM

## 2020-02-02 DIAGNOSIS — I10 Essential (primary) hypertension: Secondary | ICD-10-CM

## 2020-02-02 DIAGNOSIS — E669 Obesity, unspecified: Secondary | ICD-10-CM | POA: Diagnosis not present

## 2020-02-02 DIAGNOSIS — E118 Type 2 diabetes mellitus with unspecified complications: Secondary | ICD-10-CM

## 2020-02-02 LAB — GLUCOSE, POCT (MANUAL RESULT ENTRY): POC Glucose: 213 mg/dl — AB (ref 70–99)

## 2020-02-02 NOTE — Progress Notes (Signed)
Patient ID: Desiree Sanchez, female    DOB: 1950/08/17  MRN: [id number]  CC: Diabetes and Hypertension   Subjective: Desiree Sanchez is a 69 y.o. female who presents for chronic ds management Her concerns today include:  Pt with hx of HTN, HL, DM, CKD stage 3, insomnia non-obstructing CAD (cath 08/2016, for med management), RT CTS,gout, IDA.  DIABETES TYPE 2/Obesity Last A1C:   Results for orders placed or performed in visit on 02/02/20  POCT glucose (manual entry)  Result Value Ref Range   POC Glucose 213 (A) 70 - 99 mg/dl    Lab Results  Component Value Date   HGBA1C 6.4 (H) 10/09/2019   Med Adherence:  [x]  Yes    []  No Medication side effects:  []  Yes    [x]  No Home Monitoring?  [x]  Yes  3 x a wk fasting in the mornings Home glucose results range: less than 120 Diet Adherence: [x]  Yes    []  No Exercise: [x]  Yes  -still goes to [facility] 3x/wk - treadmill, wgh and swim.  She has lost 7 pounds within the past 6 weeks.  She is quite pleased about this. Hypoglycemic episodes?: []  Yes    [x]  No Numbness of the feet? []  Yes    [x]  No Retinopathy hx? []  Yes    [x]  No Last eye exam: over due.  Referred in April, called by [facility] but she could not get there due to her schedule.  She plans to call and reschedule. Comments:   HYPERTENSION/CAD Currently taking: see medication list.  She is on BiDil, carvedilol, atorvastatin and aspirin Med Adherence: [x]  Yes    []  No Medication side effects: []  Yes    [x]  No Adherence with salt restriction: [x]  Yes    []  No Home Monitoring?: []  Yes    [x]  No Monitoring Frequency: []  Yes    []  No Home BP results range: []  Yes    []  No SOB? []  Yes    [x]  No Chest Pain?: []  Yes    [x]  No Leg swelling?: []  Yes    [x]  No Headaches?: []  Yes    [x]  No Dizziness? []  Yes    [x]  No Comments: LDL was at 80.   I went over lab results with her that were done back in May.  Kidney function is stable at stage IIIa.  She uses Indocin sparingly whenever she gets a  flare of gout.  No recent flare.  HM:  Due for flu vaccine.  Wants to wait on getting this until end of next mth.  Also due for Shingrix vaccine.  Mammogram and bone density study scheduled for next month.  Patient Active Problem List   Diagnosis Date Noted  . Arthritis of finger of left hand 08/05/2018  . Primary osteoarthritis of both knees 10/29/2017  . CAD (coronary artery disease) 02/08/2017  . Insomnia 11/06/2016  . Iron deficiency anemia 09/14/2016  . Shoulder pain, left 02/11/2016  . Right carpal tunnel syndrome 07/29/2014  . Vitamin D insufficiency 04/13/2014  . Obesity (BMI 30-39.9) 03/13/2013  . Gout 05/20/2009  . HEART MURMUR, SYSTOLIC 40/98/1191  . DM2 (diabetes mellitus, type 2) ([place]) 04/22/2009  . Essential hypertension 04/22/2009     Current Outpatient Medications on File Prior to Visit  Medication Sig Dispense Refill  . ACCU-CHEK SOFTCLIX LANCETS lancets 1 each by Other route daily. ICD 10 E11.21 100 each 12  . albuterol (PROVENTIL HFA;VENTOLIN HFA) 108 (90 Base) MCG/ACT inhaler Inhale 2 puffs  into the lungs every 6 (six) hours as needed for wheezing or shortness of breath. 3 Inhaler 2  . aspirin EC 81 MG tablet Take 1 tablet (81 mg total) by mouth daily. 90 tablet 3  . atorvastatin (LIPITOR) 10 MG tablet Take 1 tablet (10 mg total) by mouth daily. 90 tablet 3  . Blood Glucose Monitoring Suppl (ACCU-CHEK AVIVA PLUS) w/Device KIT 1 Device by Does not apply route daily. ICD 10 E11.21 1 kit 0  . Blood Pressure KIT 1 each by Does not apply route daily. 1 each 0  . carvedilol (COREG) 25 MG tablet Take 1 tablet (25 mg total) by mouth 2 (two) times daily with a meal. 180 tablet 3  . colchicine 0.6 MG tablet Take 1 tablet (0.6 mg total) by mouth 2 (two) times daily as needed (gout flare). 180 tablet 0  . diclofenac Sodium (VOLTAREN) 1 % GEL APPLY 2 GRAMS TOPICALLY FOUR TIMES DAILY 700 g 1  . gabapentin (NEURONTIN) 100 MG capsule TAKE 3 CAPSULES AT BEDTIME (Patient taking  differently: TAKE 3 CAPSULES AS NEEDED AT BEDTIME) 270 capsule 3  . glucose blood (ACCU-CHEK AVIVA PLUS) test strip 1 each by Other route daily. ICD 10 E11.21 100 each 12  . indomethacin (INDOCIN) 50 MG capsule TAKE 1 CAPSULE TWICE DAILY AS NEEDED FOR ACUTE GOUT ATTACKS 30 capsule 1  . isosorbide-hydrALAZINE (BIDIL) 20-37.5 MG tablet Take 1 tablet by mouth 3 (three) times daily. 270 tablet 3  . loratadine (CLARITIN) 10 MG tablet Take 1 tablet (10 mg total) by mouth daily as needed for allergies. 90 tablet 2  . metFORMIN (GLUCOPHAGE-XR) 500 MG 24 hr tablet Take 2 tablets (1,000 mg total) by mouth daily with breakfast. 180 tablet 3  . traMADol (ULTRAM) 50 MG tablet Take 1 tablet (50 mg total) by mouth at bedtime for 15 days. 15 tablet 0  . Triamcinolone Acetonide (TRIAMCINOLONE 0.1 % CREAM : EUCERIN) CREA Apply 1 application topically 2 (two) times daily as needed. Please mix 1:1 (Patient taking differently: Apply 1 application topically 2 (two) times daily as needed. Please mix 1:1) 1 each 3  . triamcinolone cream (KENALOG) 0.1 % APPLY TOPICALLY TWICE DAILY AS NEEDED. 60 g 0   No current facility-administered medications on file prior to visit.    Allergies  Allergen Reactions  . Lisinopril Swelling    REACTION: Swolen lips (only)  . Hctz [Hydrochlorothiazide] Other (See Comments)    Joint pain   . Naproxen Itching and Palpitations  . Norvasc [Amlodipine Besylate] Other (See Comments)    Peripheral edema     Social History   Socioeconomic History  . Marital status: Single    Spouse name: Not on file  . Number of children: Not on file  . Years of education: Not on file  . Highest education level: Not on file  Occupational History  . Not on file  Tobacco Use  . Smoking status: Former Smoker    Quit date: 2009    Years since quitting: 12.6  . Smokeless tobacco: Never Used  Substance and Sexual Activity  . Alcohol use: Yes    Comment: occasionally  . Drug use: No  . Sexual  activity: Not on file  Other Topics Concern  . Not on file  Social History Narrative  . Not on file   Social Determinants of Health   Financial Resource Strain:   . Difficulty of Paying Living Expenses: Not on file  Food Insecurity:   . Worried About  Running Out of Food in the Last Year: Not on file  . Ran Out of Food in the Last Year: Not on file  Transportation Needs:   . Lack of Transportation (Medical): Not on file  . Lack of Transportation (Non-Medical): Not on file  Physical Activity:   . Days of Exercise per Week: Not on file  . Minutes of Exercise per Session: Not on file  Stress:   . Feeling of Stress : Not on file  Social Connections:   . Frequency of Communication with Friends and Family: Not on file  . Frequency of Social Gatherings with Friends and Family: Not on file  . Attends Religious Services: Not on file  . Active Member of Clubs or Organizations: Not on file  . Attends Archivist Meetings: Not on file  . Marital Status: Not on file  Intimate Partner Violence:   . Fear of Current or Ex-Partner: Not on file  . Emotionally Abused: Not on file  . Physically Abused: Not on file  . Sexually Abused: Not on file    Family History  Problem Relation Age of Onset  . Diabetes Mother   . Diabetes Sister     Past Surgical History:  Procedure Laterality Date  . APPENDECTOMY    . INTRAVASCULAR PRESSURE WIRE/FFR STUDY N/A 09/07/2016   Procedure: Intravascular Pressure Wire/FFR Study;  Surgeon: Nelva Bush, MD;  Location: [place] [facility];  Service: Cardiovascular;  Laterality: N/A;  . LEFT HEART CATH AND CORONARY ANGIOGRAPHY N/A 09/07/2016   Procedure: Left Heart Cath and Coronary Angiography;  Surgeon: Nelva Bush, MD;  Location: [facility];  Service: Cardiovascular;  Laterality: N/A;  . TUBAL LIGATION      ROS: Review of Systems Negative except as stated above  PHYSICAL EXAM: BP 125/71   Pulse 71   Temp 98.3 F (36.8 C)   Resp  16   Wt 237 lb 12.8 oz (107.9 kg)   SpO2 98%   BMI 36.16 kg/m   Wt Readings from Last 3 Encounters:  02/02/20 237 lb 12.8 oz (107.9 kg)  01/22/20 244 lb (110.7 kg)  08/05/18 250 lb 3.2 oz (113.5 kg)    Physical Exam  General appearance - alert, well appearing, and in no distress Mental status - normal mood, behavior, speech, dress, motor activity, and thought processes Neck - supple, no significant adenopathy Chest - clear to auscultation, no wheezes, rales or rhonchi, symmetric air entry Heart - normal rate, regular rhythm, normal S1, S2, no murmurs, rubs, clicks or gallops Extremities - peripheral pulses normal, no pedal edema, no clubbing or cyanosis Leap exam: Feet without ulcers or calluses.  Toenails are appropriately clipped.  Dorsalis pedis and posterior tibialis pulses 3+ bilaterally.  Some varicose veins on the dorsal surface of both feet.  Good sensation on the plantar surface.  CMP Latest Ref Rng & Units 10/09/2019 10/29/2017 11/06/2016  Glucose 65 - 99 mg/dL 153(H) 137(H) 188(H)  BUN 8 - 27 mg/dL 11 16 21   Creatinine 0.57 - 1.00 mg/dL 1.11(H) 1.25(H) 1.26(H)  Sodium 134 - 144 mmol/L 142 141 143  Potassium 3.5 - 5.2 mmol/L 4.2 4.4 4.5  Chloride 96 - 106 mmol/L 108(H) 108(H) 108(H)  CO2 20 - 29 mmol/L 18(L) 16(L) 21  Calcium 8.7 - 10.3 mg/dL 9.3 9.6 9.5  Total Protein 6.0 - 8.5 g/dL 7.0 7.2 -  Total Bilirubin 0.0 - 1.2 mg/dL 0.3 0.5 -  Alkaline Phos 39 - 117 IU/L 74 71 -  AST 0 - 40 IU/L 12 17 -  ALT 0 - 32 IU/L 10 11 -   Lipid Panel     Component Value Date/Time   CHOL 145 10/09/2019 0852   TRIG 97 10/09/2019 0852   HDL 47 10/09/2019 0852   CHOLHDL 3.1 10/09/2019 0852   CHOLHDL 3.5 09/05/2016 0228   VLDL 22 09/05/2016 0228   LDLCALC 80 10/09/2019 0852    CBC    Component Value Date/Time   WBC 5.5 10/09/2019 0852   WBC 5.4 09/08/2016 0252   RBC 3.91 10/09/2019 0852   RBC 3.11 (L) 09/08/2016 0252   HGB 11.8 10/09/2019 0852   HCT 34.7 10/09/2019 0852   PLT  263 10/09/2019 0852   MCV 89 10/09/2019 0852   MCH 30.2 10/09/2019 0852   MCH 28.3 09/08/2016 0252   MCHC 34.0 10/09/2019 0852   MCHC 31.9 09/08/2016 0252   RDW 13.2 10/09/2019 0852   LYMPHSABS 2.6 04/22/2009 2257   MONOABS 0.6 04/22/2009 2257   EOSABS 0.1 04/22/2009 2257   BASOSABS 0.0 04/22/2009 2257    ASSESSMENT AND PLAN: 1. Type 2 diabetes mellitus with obesity (HCC) Blood sugars at goal.  Continue Metformin.  Continue healthy eating habits and regular exercise. Encourage patient to call Dr. Sherril Croon office back to reschedule her eye appointment.  She states she will do so. - POCT glucose (manual entry)  2. Essential hypertension At goal.  Continue current medications and low-salt diet.  3. Coronary artery disease involving native coronary artery of native heart without angina pectoris Stable.  Continue carvedilol, BiDil, aspirin and Lipitor.  4. Stage 3a chronic kidney disease Stable.  We will continue to monitor.  5. Need for influenza vaccination Patient wants to put off getting this vaccine for at least another month.  We will have her scheduled with the clinical pharmacist to come back in 1 month to have this.  6. Need for shingles vaccine She is due for shingles vaccine.  Patient wants to start the vaccine series on a subsequent visit.    Patient was given the opportunity to ask questions.  Patient verbalized understanding of the plan and was able to repeat key elements of the plan.   Orders Placed This Encounter  Procedures  . POCT glucose (manual entry)     Requested Prescriptions    No prescriptions requested or ordered in this encounter    No follow-ups on file.  Karle Plumber, MD, FACP

## 2020-02-02 NOTE — Patient Instructions (Signed)
Please remember to call [facility] and schedule your eye exam. Keep the appointment for your mammogram and bone density studies both of which is scheduled for next month. We will schedule you to come back in 1 month to have your flu vaccine.  We will plan to give the shingles vaccine on a subsequent visit.

## 2020-02-22 DIAGNOSIS — H524 Presbyopia: Secondary | ICD-10-CM | POA: Diagnosis not present

## 2020-03-01 ENCOUNTER — Other Ambulatory Visit: Payer: Medicare HMO

## 2020-03-01 ENCOUNTER — Ambulatory Visit: Payer: Medicare HMO

## 2020-03-04 ENCOUNTER — Ambulatory Visit: Payer: Medicare HMO | Admitting: Pharmacist

## 2020-03-04 ENCOUNTER — Other Ambulatory Visit: Payer: Self-pay | Admitting: Internal Medicine

## 2020-03-04 NOTE — Telephone Encounter (Signed)
Requested Prescriptions  Pending Prescriptions Disp Refills  . triamcinolone cream (KENALOG) 0.1 % [Pharmacy Med Name: TRIAMCINOLONE ACETONIDE 0.1 % Cream] 60 g 0    Sig: APPLY TOPICALLY TWICE DAILY AS NEEDED.     Dermatology:  Corticosteroids Passed - 03/04/2020  4:14 AM      Passed - Valid encounter within last 12 months    Recent Outpatient Visits          1 month ago Type 2 diabetes mellitus with obesity ([place])   [place] Ladell Pier, MD   1 month ago Need for Tdap vaccination   [place], Jarome Matin, RPH-CPP   5 months ago Controlled type 2 diabetes mellitus with complication, without long-term current use of insulin [facility])   [place] Karle Plumber B, MD   9 months ago Controlled type 2 diabetes mellitus with complication, without long-term current use of insulin [facility])   [place], Deborah B, MD   1 year ago Uncontrolled type 2 diabetes mellitus with complication, without long-term current use of insulin [facility])   [place], MD      Future Appointments            In 3 months Wynetta Emery, Dalbert Batman, MD [place]

## 2020-03-20 ENCOUNTER — Other Ambulatory Visit: Payer: Self-pay

## 2020-03-20 ENCOUNTER — Ambulatory Visit: Payer: Medicare HMO | Attending: Internal Medicine | Admitting: Pharmacist

## 2020-03-20 DIAGNOSIS — Z23 Encounter for immunization: Secondary | ICD-10-CM | POA: Diagnosis not present

## 2020-03-20 MED FILL — traMADol HCL 50 MG TABS: 50 | 7 days supply | Qty: 7 | Fill #1

## 2020-03-20 NOTE — Progress Notes (Signed)
Patient presents for vaccination against influenza per orders of Dr. Johnson. Consent given. Counseling provided. No contraindications exists. Vaccine administered without incident.  ° °Luke Van Ausdall, PharmD, CPP °Clinical Pharmacist °[facility] °[phone] ° °

## 2020-04-26 ENCOUNTER — Other Ambulatory Visit: Payer: Self-pay | Admitting: Family

## 2020-04-26 ENCOUNTER — Other Ambulatory Visit: Payer: Self-pay | Admitting: Internal Medicine

## 2020-04-26 DIAGNOSIS — M170 Bilateral primary osteoarthritis of knee: Secondary | ICD-10-CM

## 2020-04-28 ENCOUNTER — Other Ambulatory Visit: Payer: Self-pay | Admitting: Internal Medicine

## 2020-05-05 ENCOUNTER — Other Ambulatory Visit: Payer: Self-pay | Admitting: Internal Medicine

## 2020-05-05 DIAGNOSIS — M170 Bilateral primary osteoarthritis of knee: Secondary | ICD-10-CM

## 2020-05-08 DEATH — deceased

## 2020-05-15 ENCOUNTER — Other Ambulatory Visit: Payer: Self-pay | Admitting: Internal Medicine

## 2020-05-15 DIAGNOSIS — Z8739 Personal history of other diseases of the musculoskeletal system and connective tissue: Secondary | ICD-10-CM

## 2020-05-15 DIAGNOSIS — I10 Essential (primary) hypertension: Secondary | ICD-10-CM

## 2020-05-15 DIAGNOSIS — I2510 Atherosclerotic heart disease of native coronary artery without angina pectoris: Secondary | ICD-10-CM

## 2020-05-15 NOTE — Telephone Encounter (Signed)
Future visit in 3 weeks °

## 2020-05-15 NOTE — Telephone Encounter (Signed)
Requested medication (s) are due for refill today: expired medication  Requested medication (s) are on the active medication list: yes   Last refill:  07/19/2018 #180 3 refills   Future visit scheduled: yes in 3 weeks   Notes to clinic:  expired medication. Do you want to renew Rx?    Requested Prescriptions  Pending Prescriptions Disp Refills   carvedilol (COREG) 25 MG tablet [Pharmacy Med Name: CARVEDILOL 25 MG Tablet] 180 tablet 3    Sig: TAKE 1 TABLET BY MOUTH 2  TIMES DAILY WITH A MEAL.      Cardiovascular:  Beta Blockers Passed - 05/15/2020  4:51 PM      Passed - Last BP in normal range    BP Readings from Last 1 Encounters:  02/02/20 125/71          Passed - Last Heart Rate in normal range    Pulse Readings from Last 1 Encounters:  02/02/20 71          Passed - Valid encounter within last 6 months    Recent Outpatient Visits           3 months ago Type 2 diabetes mellitus with obesity ([place])   [place] Ladell Pier, MD   3 months ago Need for Tdap vaccination   [place], Annie Main L, RPH-CPP   7 months ago Controlled type 2 diabetes mellitus with complication, without long-term current use of insulin [facility])   [place] Karle Plumber B, MD   12 months ago Controlled type 2 diabetes mellitus with complication, without long-term current use of insulin ([place])   [place] Karle Plumber B, MD   1 year ago Uncontrolled type 2 diabetes mellitus with complication, without long-term current use of insulin ([place])   [place] Ladell Pier, MD       Future Appointments             In 3 weeks Ladell Pier, MD [place]             Signed Prescriptions Disp Refills   indomethacin (INDOCIN) 50 MG capsule 30 capsule 0    Sig: TAKE 1 CAPSULE TWICE  DAILY AS NEEDED FOR ACUTE GOUT ATTACKS      Analgesics:  NSAIDS Failed - 05/15/2020  4:51 PM      Failed - Cr in normal range and within 360 days    Creat  Date Value Ref Range Status  08/03/2016 1.19 (H) 0.50 - 0.99 mg/dL Final    Comment:      For patients > or = 71 years of age: The upper reference limit for Creatinine is approximately 13% higher for people identified as African-American.      Creatinine, Ser  Date Value Ref Range Status  10/09/2019 1.11 (H) 0.57 - 1.00 mg/dL Final   Creatinine, Urine  Date Value Ref Range Status  07/09/2015 120 20 - 320 mg/dL Final          Passed - HGB in normal range and within 360 days    Hemoglobin  Date Value Ref Range Status  10/09/2019 11.8 11.1 - 15.9 g/dL Final          Passed - Patient is not pregnant      Passed - Valid encounter within last 12 months    Recent Outpatient  Visits           3 months ago Type 2 diabetes mellitus with obesity [facility])   [place] Ladell Pier, MD   3 months ago Need for Tdap vaccination   [place], Jarome Matin, RPH-CPP   7 months ago Controlled type 2 diabetes mellitus with complication, without long-term current use of insulin [facility])   [place] Karle Plumber B, MD   12 months ago Controlled type 2 diabetes mellitus with complication, without long-term current use of insulin [facility])   [place], Deborah B, MD   1 year ago Uncontrolled type 2 diabetes mellitus with complication, without long-term current use of insulin [facility])   [place], MD       Future Appointments             In 3 weeks Ladell Pier, MD [place]

## 2020-06-11 ENCOUNTER — Other Ambulatory Visit: Payer: Self-pay

## 2020-06-11 ENCOUNTER — Ambulatory Visit: Payer: Medicare HMO | Attending: Internal Medicine | Admitting: Internal Medicine

## 2020-06-11 ENCOUNTER — Encounter: Payer: Self-pay | Admitting: Internal Medicine

## 2020-06-11 ENCOUNTER — Ambulatory Visit: Payer: Medicare HMO | Attending: Internal Medicine

## 2020-06-11 DIAGNOSIS — I2510 Atherosclerotic heart disease of native coronary artery without angina pectoris: Secondary | ICD-10-CM | POA: Diagnosis not present

## 2020-06-11 DIAGNOSIS — E669 Obesity, unspecified: Secondary | ICD-10-CM

## 2020-06-11 DIAGNOSIS — I10 Essential (primary) hypertension: Secondary | ICD-10-CM | POA: Diagnosis not present

## 2020-06-11 DIAGNOSIS — Z8739 Personal history of other diseases of the musculoskeletal system and connective tissue: Secondary | ICD-10-CM | POA: Diagnosis not present

## 2020-06-11 DIAGNOSIS — E1169 Type 2 diabetes mellitus with other specified complication: Secondary | ICD-10-CM

## 2020-06-11 MED FILL — traMADol HCL 50 MG TABS: 50 | 30 days supply | Qty: 30 | Fill #0

## 2020-06-11 NOTE — Progress Notes (Signed)
Virtual Visit via Telephone Note  I connected with Desiree Sanchez on 06/11/20 at  8:30 AM EST by telephone and verified that I am speaking with the correct person using two identifiers.  Location: Patient: at pharmacy Provider: in office   I discussed the limitations, risks, security and privacy concerns of performing an evaluation and management service by telephone and the availability of in person appointments. I also discussed with the patient that there may be a patient responsible charge related to this service. The patient expressed understanding and agreed to proceed.   History of Present Illness: Pt with hx of HTN, HL, DM, CKD stage 3, insomnia non-obstructing CAD (cath 08/2016, for med management), RT CTS,gout, IDA.  DM: Reports compliance with Metformin. Blood sugar this morning was 120 before breakfast.  She is checking blood sugars 3 times a week with range being 100-120. Did not overeat during the holidays. Eating more fruits/vegetables and salads.  She drinks mainly water. She stopped going to the [facility] due to the uptick in COVID-19 cases.  She has been walking every morning in her neighborhood for 15 minutes depending on the weather. Received a COVID-19 booster shot 04/08/2020.  HYPERTENSION/CAD Currently taking: see medication list Med Adherence: _0  Yes  -she is on BiDil, carvedilol, atorvastatin and aspirin Medication side effects: _1  Yes    _2  No Adherence with salt restriction: _3  Yes    _4  No Home Monitoring?: _5  Yes   she uses her friend's device.  Reports blood pressure range has been 120-130/70-80 Monitoring Frequency: _6  Yes    _7  No Home BP results range: _8  Yes    _9  No SOB? _10  Yes    _11  No Chest Pain?: _12  Yes    _13  No Leg swelling?: _14  Yes    _15  No Headaches?: _16  Yes    _17  No Dizziness? _18  Yes    _19  No Comments:   Gout: Reports no recent flares.  Outpatient Encounter Medications as of 06/11/2020  Medication Sig  . traMADol (ULTRAM) 50 MG  tablet Take 1 tablet (50 mg total) by mouth daily as needed for moderate pain.  Marland Kitchen ACCU-CHEK SOFTCLIX LANCETS lancets 1 each by Other route daily. ICD 10 E11.21  . albuterol (PROVENTIL HFA;VENTOLIN HFA) 108 (90 Base) MCG/ACT inhaler Inhale 2 puffs into the lungs every 6 (six) hours as needed for wheezing or shortness of breath.  Marland Kitchen aspirin EC 81 MG tablet Take 1 tablet (81 mg total) by mouth daily.  Marland Kitchen atorvastatin (LIPITOR) 10 MG tablet Take 1 tablet (10 mg total) by mouth daily.  . Blood Glucose Monitoring Suppl (ACCU-CHEK AVIVA PLUS) w/Device KIT 1 Device by Does not apply route daily. ICD 10 E11.21  . Blood Pressure KIT 1 each by Does not apply route daily.  . carvedilol (COREG) 25 MG tablet TAKE 1 TABLET BY MOUTH 2  TIMES DAILY WITH A MEAL.  Marland Kitchen colchicine 0.6 MG tablet Take 1 tablet (0.6 mg total) by mouth 2 (two) times daily as needed (gout flare).  . diclofenac Sodium (VOLTAREN) 1 % GEL APPLY 2 GRAMS TOPICALLY FOUR TIMES DAILY  . gabapentin (NEURONTIN) 100 MG capsule TAKE 3 CAPSULES AT BEDTIME (Patient taking differently: TAKE 3 CAPSULES AS NEEDED AT BEDTIME)  . glucose blood (ACCU-CHEK AVIVA PLUS) test strip 1 each by Other route daily. ICD 10 E11.21  . indomethacin (INDOCIN) 50 MG capsule TAKE 1 CAPSULE TWICE DAILY AS NEEDED FOR ACUTE GOUT ATTACKS  . isosorbide-hydrALAZINE (BIDIL) 20-37.5 MG tablet Take 1 tablet by  mouth 3 (three) times daily.  Marland Kitchen loratadine (CLARITIN) 10 MG tablet Take 1 tablet (10 mg total) by mouth daily as needed for allergies.  . metFORMIN (GLUCOPHAGE-XR) 500 MG 24 hr tablet Take 2 tablets (1,000 mg total) by mouth daily with breakfast.  . triamcinolone (KENALOG) 0.1 % APPLY TOPICALLY TWICE DAILY AS NEEDED.  . Triamcinolone Acetonide (TRIAMCINOLONE 0.1 % CREAM : EUCERIN) CREA Apply 1 application topically 2 (two) times daily as needed. Please mix 1:1 (Patient taking differently: Apply 1 application topically 2 (two) times daily as needed. Please mix 1:1)   No  facility-administered encounter medications on file as of 06/11/2020.      Observations/Objective:    Chemistry      Component Value Date/Time   NA 142 10/09/2019 0852   K 4.2 10/09/2019 0852   CL 108 (H) 10/09/2019 0852   CO2 18 (L) 10/09/2019 0852   BUN 11 10/09/2019 0852   CREATININE 1.11 (H) 10/09/2019 0852   CREATININE 1.19 (H) 08/03/2016 1529      Component Value Date/Time   CALCIUM 9.3 10/09/2019 0852   ALKPHOS 74 10/09/2019 0852   AST 12 10/09/2019 0852   ALT 10 10/09/2019 0852   BILITOT 0.3 10/09/2019 0852     Lab Results  Component Value Date   WBC 5.5 10/09/2019   HGB 11.8 10/09/2019   HCT 34.7 10/09/2019   MCV 89 10/09/2019   PLT 263 10/09/2019     Assessment and Plan: 1. Type 2 diabetes mellitus with obesity ([place]) Commended her on healthy eating habits and regular exercise.  Encouraged her to keep up the good work.  Continue Metformin.  She will stop at the lab to get an A1c done - Hemoglobin A1c; Future  2. Essential hypertension Reported home blood pressure readings are at goal.  Continue current medications as listed above.  3. Coronary artery disease involving native coronary artery of native heart without angina pectoris Stable.  Continue aspirin, BiDil, atorvastatin and carvedilol  4. History of gout No recent flare.  She uses colchicine and Indocin as needed   Follow Up Instructions: Follow-up in 4 months   I discussed the assessment and treatment plan with the patient. The patient was provided an opportunity to ask questions and all were answered. The patient agreed with the plan and demonstrated an understanding of the instructions.   The patient was advised to call back or seek an in-person evaluation if the symptoms worsen or if the condition fails to improve as anticipated.  I provided 12 minutes of non-face-to-face time during this encounter.   Karle Plumber, MD

## 2020-06-12 LAB — HEMOGLOBIN A1C
Est. average glucose Bld gHb Est-mCnc: 143 mg/dL
Hgb A1c MFr Bld: 6.6 % — ABNORMAL HIGH (ref 4.8–5.6)

## 2020-06-13 ENCOUNTER — Ambulatory Visit
Admission: RE | Admit: 2020-06-13 | Discharge: 2020-06-13 | Disposition: A | Discharge status: Home / Self Care | Payer: Medicare HMO | Source: Ambulatory Visit | Attending: Internal Medicine | Admitting: Internal Medicine

## 2020-06-13 ENCOUNTER — Other Ambulatory Visit: Payer: Self-pay

## 2020-06-13 DIAGNOSIS — Z1231 Encounter for screening mammogram for malignant neoplasm of breast: Secondary | ICD-10-CM

## 2020-06-13 DIAGNOSIS — Z780 Asymptomatic menopausal state: Secondary | ICD-10-CM

## 2020-06-13 DIAGNOSIS — M8589 Other specified disorders of bone density and structure, multiple sites: Secondary | ICD-10-CM | POA: Diagnosis not present

## 2020-06-17 ENCOUNTER — Other Ambulatory Visit: Payer: Self-pay | Admitting: Internal Medicine

## 2020-07-10 ENCOUNTER — Other Ambulatory Visit: Payer: Self-pay | Admitting: Internal Medicine

## 2020-07-10 DIAGNOSIS — M170 Bilateral primary osteoarthritis of knee: Secondary | ICD-10-CM

## 2020-08-12 ENCOUNTER — Other Ambulatory Visit: Payer: Self-pay | Admitting: Internal Medicine

## 2020-08-15 ENCOUNTER — Telehealth: Payer: Self-pay | Admitting: Internal Medicine

## 2020-08-15 NOTE — Telephone Encounter (Signed)
Patient will need to contact her insurance company.

## 2020-08-15 NOTE — Telephone Encounter (Signed)
Copied from [place] [phone]. Topic: General - Inquiry >> Aug 15, 2020  9:05 AM Greggory Keen D wrote: Reason for CRM: Pt called saying she is having a problem with the medication Bidil.  She said the price is 1400.00 and her copay is 200.00.  She said she usually pays 3.00 for the medication.  CB# [phone]   Is there anyway you could assist with this or does patient need to contact her insurance?

## 2020-08-16 ENCOUNTER — Other Ambulatory Visit: Payer: Self-pay | Admitting: Internal Medicine

## 2020-08-16 DIAGNOSIS — E118 Type 2 diabetes mellitus with unspecified complications: Secondary | ICD-10-CM

## 2020-08-16 NOTE — Telephone Encounter (Signed)
Requested medication (s) are due for refill today: expired medication  Requested medication (s) are on the active medication list: yes  Last refill:  05/19/2019 #180 3 refills  Future visit scheduled: yes  Notes to clinic:  expired medication     Requested Prescriptions  Pending Prescriptions Disp Refills   metFORMIN (GLUCOPHAGE-XR) 500 MG 24 hr tablet [Pharmacy Med Name: METFORMIN HYDROCHLORIDE ER 500 MG Tablet Extended Release 24 Hour] 180 tablet 3    Sig: TAKE 2 TABLETS BY MOUTH DAILY WITH BREAKFAST.      Endocrinology:  Diabetes - Biguanides Failed - 08/16/2020  6:23 PM      Failed - Cr in normal range and within 360 days    Creat  Date Value Ref Range Status  08/03/2016 1.19 (H) 0.50 - 0.99 mg/dL Final    Comment:      For patients > or = 72 years of age: The upper reference limit for Creatinine is approximately 13% higher for people identified as African-American.      Creatinine, Ser  Date Value Ref Range Status  10/09/2019 1.11 (H) 0.57 - 1.00 mg/dL Final   Creatinine, Urine  Date Value Ref Range Status  07/09/2015 120 20 - 320 mg/dL Final          Failed - AA eGFR in normal range and within 360 days    GFR, Est African American  Date Value Ref Range Status  08/03/2016 55 (L) >=60 mL/min Final   GFR calc Af Amer  Date Value Ref Range Status  10/09/2019 58 (L) >59 mL/min/1.73 Final    Comment:    **[facility] currently reports eGFR in compliance with the current**   recommendations of the [employer]. [facility] will   update reporting as new guidelines are published from the NKF-ASN   Task force.    GFR, Est Non African American  Date Value Ref Range Status  08/03/2016 47 (L) >=60 mL/min Final   GFR calc non Af Amer  Date Value Ref Range Status  10/09/2019 50 (L) >59 mL/min/1.73 Final          Passed - HBA1C is between 0 and 7.9 and within 180 days    HbA1c, POC (controlled diabetic range)  Date Value Ref Range Status  08/05/2018  7.6 (A) 0.0 - 7.0 % Final   Hgb A1c MFr Bld  Date Value Ref Range Status  06/11/2020 6.6 (H) 4.8 - 5.6 % Final    Comment:             Prediabetes: 5.7 - 6.4          Diabetes: >6.4          Glycemic control for adults with diabetes: <7.0           Passed - Valid encounter within last 6 months    Recent Outpatient Visits           2 months ago Type 2 diabetes mellitus with obesity ([place])   [place] Karle Plumber B, MD   6 months ago Type 2 diabetes mellitus with obesity [facility])   [place] Ladell Pier, MD   6 months ago Need for Tdap vaccination   [place], Annie Main L, RPH-CPP   10 months ago Controlled type 2 diabetes mellitus with complication, without long-term current use of insulin [facility])   [place] [facility] Ladell Pier, MD  1 year ago Controlled type 2 diabetes mellitus with complication, without long-term current use of insulin [facility])   [place], MD       Future Appointments             In 1 month Wynetta Emery, Dalbert Batman, MD [place]

## 2020-08-16 NOTE — Telephone Encounter (Signed)
Pt called back in to follow up on concern. Made pt aware of per message below from Paige. Pt expressed understanding and states that she will contact her insurance company and give provider a call back if changes to the medication is needed. Pt says that co-pay is to expensive but she will contact her insurance for further assistance.

## 2020-08-16 NOTE — Telephone Encounter (Signed)
Pt called her insurance and they told her that this year they did not cover this medication and will be faxing Dr. Wynetta Emery some clinical questions to make determination on the medication

## 2020-08-17 ENCOUNTER — Other Ambulatory Visit: Payer: Self-pay | Admitting: Internal Medicine

## 2020-08-17 DIAGNOSIS — Z8739 Personal history of other diseases of the musculoskeletal system and connective tissue: Secondary | ICD-10-CM

## 2020-08-18 NOTE — Telephone Encounter (Signed)
Requested Prescriptions  Pending Prescriptions Disp Refills  . indomethacin (INDOCIN) 50 MG capsule [Pharmacy Med Name: INDOMETHACIN 50 MG Capsule] 30 capsule 0    Sig: TAKE 1 CAPSULE TWICE DAILY AS NEEDED FOR ACUTE GOUT ATTACKS     Analgesics:  NSAIDS Failed - 08/17/2020  8:54 PM      Failed - Cr in normal range and within 360 days    Creat  Date Value Ref Range Status  08/03/2016 1.19 (H) 0.50 - 0.99 mg/dL Final    Comment:      For patients > or = 72 years of age: The upper reference limit for Creatinine is approximately 13% higher for people identified as African-American.      Creatinine, Ser  Date Value Ref Range Status  10/09/2019 1.11 (H) 0.57 - 1.00 mg/dL Final   Creatinine, Urine  Date Value Ref Range Status  07/09/2015 120 20 - 320 mg/dL Final         Passed - HGB in normal range and within 360 days    Hemoglobin  Date Value Ref Range Status  10/09/2019 11.8 11.1 - 15.9 g/dL Final         Passed - Patient is not pregnant      Passed - Valid encounter within last 12 months    Recent Outpatient Visits          2 months ago Type 2 diabetes mellitus with obesity ([place])   [place] Karle Plumber B, MD   6 months ago Type 2 diabetes mellitus with obesity [facility])   [place] Ladell Pier, MD   6 months ago Need for Tdap vaccination   [place], Annie Main L, RPH-CPP   10 months ago Controlled type 2 diabetes mellitus with complication, without long-term current use of insulin [facility])   [place], Deborah B, MD   1 year ago Controlled type 2 diabetes mellitus with complication, without long-term current use of insulin [facility])   [place], MD      Future Appointments            In 1 month Wynetta Emery, Dalbert Batman, MD [place]

## 2020-08-19 NOTE — Telephone Encounter (Signed)
Desiree Sanchez could you refill medication if appropriate

## 2020-08-27 MED ORDER — HYDRALAZINE HCL 25 MG PO TABS: 37.5000 mg | ORAL_TABLET | Freq: Three times a day (TID) | ORAL | 3 refills | Status: DC

## 2020-08-27 MED ORDER — ISOSORBIDE DINITRATE 20 MG PO TABS: 20.0000 mg | ORAL_TABLET | Freq: Three times a day (TID) | ORAL | 3 refills | Status: DC

## 2020-08-27 NOTE — Addendum Note (Signed)
Addended by: Karle Plumber B on: 08/27/2020 04:15 PM   Modules accepted: Orders

## 2020-08-29 NOTE — Telephone Encounter (Signed)
Called pt made aware of MD message.

## 2020-09-02 ENCOUNTER — Other Ambulatory Visit: Payer: Self-pay | Admitting: Internal Medicine

## 2020-09-02 DIAGNOSIS — Z8739 Personal history of other diseases of the musculoskeletal system and connective tissue: Secondary | ICD-10-CM

## 2020-09-02 NOTE — Telephone Encounter (Signed)
Requested Prescriptions  Pending Prescriptions Disp Refills  . indomethacin (INDOCIN) 50 MG capsule [Pharmacy Med Name: INDOMETHACIN 50 MG Capsule] 90 capsule 0    Sig: TAKE 1 CAPSULE TWICE DAILY AS NEEDED FOR ACUTE GOUT ATTACKS     Analgesics:  NSAIDS Failed - 09/02/2020  6:51 PM      Failed - Cr in normal range and within 360 days    Creat  Date Value Ref Range Status  08/03/2016 1.19 (H) 0.50 - 0.99 mg/dL Final    Comment:      For patients > or = 71 years of age: The upper reference limit for Creatinine is approximately 13% higher for people identified as African-American.      Creatinine, Ser  Date Value Ref Range Status  10/09/2019 1.11 (H) 0.57 - 1.00 mg/dL Final   Creatinine, Urine  Date Value Ref Range Status  07/09/2015 120 20 - 320 mg/dL Final         Passed - HGB in normal range and within 360 days    Hemoglobin  Date Value Ref Range Status  10/09/2019 11.8 11.1 - 15.9 g/dL Final         Passed - Patient is not pregnant      Passed - Valid encounter within last 12 months    Recent Outpatient Visits          2 months ago Type 2 diabetes mellitus with obesity ([place])   [place] Karle Plumber B, MD   7 months ago Type 2 diabetes mellitus with obesity [facility])   [place] Ladell Pier, MD   7 months ago Need for Tdap vaccination   [place], Jarome Matin, RPH-CPP   11 months ago Controlled type 2 diabetes mellitus with complication, without long-term current use of insulin [facility])   [place], Deborah B, MD   1 year ago Controlled type 2 diabetes mellitus with complication, without long-term current use of insulin [facility])   [place], MD      Future Appointments            In 1 month Wynetta Emery, Dalbert Batman, MD [place]

## 2020-09-20 ENCOUNTER — Other Ambulatory Visit: Payer: Self-pay | Admitting: Internal Medicine

## 2020-09-20 DIAGNOSIS — M170 Bilateral primary osteoarthritis of knee: Secondary | ICD-10-CM

## 2020-10-04 ENCOUNTER — Other Ambulatory Visit: Payer: Self-pay | Admitting: Internal Medicine

## 2020-10-04 NOTE — Telephone Encounter (Signed)
Approved  Per protocol. Requested Prescriptions  Pending Prescriptions Disp Refills  . triamcinolone cream (KENALOG) 0.1 % [Pharmacy Med Name: TRIAMCINOLONE ACETONIDE 0.1 % Cream] 60 g 0    Sig: APPLY TOPICALLY TWICE DAILY AS NEEDED.     Dermatology:  Corticosteroids Passed - 10/04/2020  4:19 AM      Passed - Valid encounter within last 12 months    Recent Outpatient Visits          3 months ago Type 2 diabetes mellitus with obesity ([place])   [place] Karle Plumber B, MD   8 months ago Type 2 diabetes mellitus with obesity [facility])   [place] Ladell Pier, MD   8 months ago Need for Tdap vaccination   [place], RPH-CPP   1 year ago Controlled type 2 diabetes mellitus with complication, without long-term current use of insulin [facility])   [place], Deborah B, MD   1 year ago Controlled type 2 diabetes mellitus with complication, without long-term current use of insulin [facility])   [place], MD      Future Appointments            In 3 days Ladell Pier, MD [place]

## 2020-10-07 ENCOUNTER — Other Ambulatory Visit: Payer: Self-pay

## 2020-10-07 ENCOUNTER — Encounter: Payer: Self-pay | Admitting: Internal Medicine

## 2020-10-07 ENCOUNTER — Ambulatory Visit: Payer: Medicare HMO | Attending: Internal Medicine | Admitting: Internal Medicine

## 2020-10-07 VITALS — BP 121/76 | HR 70 | Resp 16 | Wt 236.8 lb

## 2020-10-07 DIAGNOSIS — I129 Hypertensive chronic kidney disease with stage 1 through stage 4 chronic kidney disease, or unspecified chronic kidney disease: Secondary | ICD-10-CM

## 2020-10-07 DIAGNOSIS — E785 Hyperlipidemia, unspecified: Secondary | ICD-10-CM

## 2020-10-07 DIAGNOSIS — Z8739 Personal history of other diseases of the musculoskeletal system and connective tissue: Secondary | ICD-10-CM

## 2020-10-07 DIAGNOSIS — E1169 Type 2 diabetes mellitus with other specified complication: Secondary | ICD-10-CM

## 2020-10-07 DIAGNOSIS — E1122 Type 2 diabetes mellitus with diabetic chronic kidney disease: Secondary | ICD-10-CM | POA: Diagnosis not present

## 2020-10-07 DIAGNOSIS — I2510 Atherosclerotic heart disease of native coronary artery without angina pectoris: Secondary | ICD-10-CM | POA: Diagnosis not present

## 2020-10-07 DIAGNOSIS — M170 Bilateral primary osteoarthritis of knee: Secondary | ICD-10-CM | POA: Diagnosis not present

## 2020-10-07 DIAGNOSIS — N1831 Chronic kidney disease, stage 3a: Secondary | ICD-10-CM

## 2020-10-07 DIAGNOSIS — E669 Obesity, unspecified: Secondary | ICD-10-CM

## 2020-10-07 LAB — GLUCOSE, POCT (MANUAL RESULT ENTRY): POC Glucose: 143 mg/dl — AB (ref 70–99)

## 2020-10-07 MED ORDER — TRAMADOL HCL 50 MG PO TABS
ORAL_TABLET | ORAL | 0 refills | Status: DC
  Filled 2020-10-07: qty 7, 7d supply, fill #0
  Filled 2020-11-11: qty 7, 7d supply, fill #1
  Filled 2020-12-31: qty 7, 7d supply, fill #2

## 2020-10-07 NOTE — Progress Notes (Signed)
Patient ID: Desiree Sanchez, female    DOB: September 17, 1950  MRN: [id number]  CC: Diabetes and Hypertension   Subjective: Desiree Sanchez is a 70 y.o. female who presents for  Chronic ds management Her concerns today include:  Pt with hx of HTN, HL, DM, CKD stage 3, insomnia non-obstructing CAD (cath 08/2016, for med management), RT CTS,gout, IDA.  Obesity/DM: loss 8 lbs over the past 8 mths. Going to the [employer] 3 x /wk - treadmill, swim and wgh machines Doing good with eating eats. Eating a lot of fruits and veggies, tries not to eat after 7 p.m -checking BS 2-3 x a wk. Self reported range 90-120 before meals -taking and tolerating metformin  HYPERTENSION/CAD Currently taking: see medication list.  Medications include isosorbide, carvedilol, hydralazine Med Adherence: [x]  Yes  Medication side effects: []  Yes    [x]  No - no muscle cramps/ache with Lipitor Adherence with salt restriction: [x]  Yes    []  No Home Monitoring?: [x]  Yes    []  No Monitoring Frequency: 2-3x/wk Home BP results range:  Highest 130/90 SOB? []  Yes    [x]  No Chest Pain?: []  Yes    [x]  No Leg swelling?: []  Yes    [x]  No Headaches?: []  Yes    [x]  No Dizziness? []  Yes    [x]  No Comments:   Request RF on Tramadol. Uses it for pain in knees LT>RT and in hands.  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  Had to back off doing that because of arthritis pains in the hands.  Voltaren gel helps but some days she has to take the tramadol.  Denies any significant S.E when she takes it Takes Gabapentin several nights a wk.  Helps with sleep and decrease symptoms fo CTS.  CKD: takes Indocin a 2 times a yr for 1 day when she has a flare of gout.   GOUT:  Has not had an attack in several mths.  Colchicine does not work as well as the indocin once gout has flared.  Has it but really uses it.  Ours about 2 x a yr.   Patient Active Problem List   Diagnosis Date Noted  . Arthritis of finger of left hand 08/05/2018  . Primary osteoarthritis  of both knees 10/29/2017  . CAD (coronary artery disease) 02/08/2017  . Insomnia 11/06/2016  . Iron deficiency anemia 09/14/2016  . Shoulder pain, left 02/11/2016  . Right carpal tunnel syndrome 07/29/2014  . Vitamin D insufficiency 04/13/2014  . Obesity (BMI 30-39.9) 03/13/2013  . Gout 05/20/2009  . HEART MURMUR, SYSTOLIC 01/75/1025  . DM2 (diabetes mellitus, type 2) ([place]) 04/22/2009  . Essential hypertension 04/22/2009     Current Outpatient Medications on File Prior to Visit  Medication Sig Dispense Refill  . ACCU-CHEK SOFTCLIX LANCETS lancets 1 each by Other route daily. ICD 10 E11.21 100 each 12  . albuterol (PROVENTIL HFA;VENTOLIN HFA) 108 (90 Base) MCG/ACT inhaler Inhale 2 puffs into the lungs every 6 (six) hours as needed for wheezing or shortness of breath. 3 Inhaler 2  . aspirin EC 81 MG tablet Take 1 tablet (81 mg total) by mouth daily. 90 tablet 3  . atorvastatin (LIPITOR) 10 MG tablet Take 1 tablet (10 mg total) by mouth daily. 90 tablet 3  . Blood Glucose Monitoring Suppl (ACCU-CHEK AVIVA PLUS) w/Device KIT 1 Device by Does not apply route daily. ICD 10 E11.21 1 kit 0  . Blood Pressure KIT 1 each by Does not apply  route daily. 1 each 0  . carvedilol (COREG) 25 MG tablet TAKE 1 TABLET BY MOUTH 2  TIMES DAILY WITH A MEAL. 180 tablet 3  . colchicine 0.6 MG tablet Take 1 tablet (0.6 mg total) by mouth 2 (two) times daily as needed (gout flare). 180 tablet 0  . diclofenac Sodium (VOLTAREN) 1 % GEL APPLY 2 GRAMS TOPICALLY FOUR TIMES DAILY 700 g 0  . gabapentin (NEURONTIN) 100 MG capsule TAKE 3 CAPSULES AT BEDTIME (Patient taking differently: TAKE 3 CAPSULES AS NEEDED AT BEDTIME) 270 capsule 3  . glucose blood (ACCU-CHEK AVIVA PLUS) test strip 1 each by Other route daily. ICD 10 E11.21 100 each 12  . hydrALAZINE (APRESOLINE) 25 MG tablet Take 1.5 tablets (37.5 mg total) by mouth 3 (three) times daily. 405 tablet 3  . indomethacin (INDOCIN) 50 MG capsule TAKE 1 CAPSULE TWICE DAILY  AS NEEDED FOR ACUTE GOUT ATTACKS 90 capsule 0  . isosorbide dinitrate (ISORDIL) 20 MG tablet Take 1 tablet (20 mg total) by mouth 3 (three) times daily. 270 tablet 3  . loratadine (CLARITIN) 10 MG tablet Take 1 tablet (10 mg total) by mouth daily as needed for allergies. 90 tablet 2  . metFORMIN (GLUCOPHAGE-XR) 500 MG 24 hr tablet TAKE 2 TABLETS BY MOUTH DAILY WITH BREAKFAST. 180 tablet 1  . traMADol (ULTRAM) 50 MG tablet TAKE 1 TABLET (50 MG TOTAL) BY MOUTH DAILY AS NEEDED FOR MODERATE PAIN. 30 tablet 0  . Triamcinolone Acetonide (TRIAMCINOLONE 0.1 % CREAM : EUCERIN) CREA Apply 1 application topically 2 (two) times daily as needed. Please mix 1:1 (Patient taking differently: Apply 1 application topically 2 (two) times daily as needed. Please mix 1:1) 1 each 3  . triamcinolone cream (KENALOG) 0.1 % APPLY TOPICALLY TWICE DAILY AS NEEDED. 60 g 0   No current facility-administered medications on file prior to visit.    Allergies  Allergen Reactions  . Lisinopril Swelling    REACTION: Swolen lips (only)  . Hctz [Hydrochlorothiazide] Other (See Comments)    Joint pain   . Naproxen Itching and Palpitations  . Norvasc [Amlodipine Besylate] Other (See Comments)    Peripheral edema     Social History   Socioeconomic History  . Marital status: Single    Spouse name: Not on file  . Number of children: Not on file  . Years of education: Not on file  . Highest education level: Not on file  Occupational History  . Not on file  Tobacco Use  . Smoking status: Former Smoker    Quit date: 2009    Years since quitting: 13.3  . Smokeless tobacco: Never Used  Substance and Sexual Activity  . Alcohol use: Yes    Comment: occasionally  . Drug use: No  . Sexual activity: Not on file  Other Topics Concern  . Not on file  Social History Narrative  . Not on file   Social Determinants of Health   Financial Resource Strain: Not on file  Food Insecurity: Not on file  Transportation Needs: Not on  file  Physical Activity: Not on file  Stress: Not on file  Social Connections: Not on file  Intimate Partner Violence: Not on file    Family History  Problem Relation Age of Onset  . Diabetes Mother   . Diabetes Sister     Past Surgical History:  Procedure Laterality Date  . APPENDECTOMY    . INTRAVASCULAR PRESSURE WIRE/FFR STUDY N/A 09/07/2016   Procedure: Intravascular Pressure Wire/FFR Study;  Surgeon: Nelva Bush, MD;  Location: [facility];  Service: Cardiovascular;  Laterality: N/A;  . LEFT HEART CATH AND CORONARY ANGIOGRAPHY N/A 09/07/2016   Procedure: Left Heart Cath and Coronary Angiography;  Surgeon: Nelva Bush, MD;  Location: [facility];  Service: Cardiovascular;  Laterality: N/A;  . TUBAL LIGATION      ROS: Review of Systems Negative except as stated above  PHYSICAL EXAM: BP 121/76   Pulse 70   Resp 16   Wt 236 lb 12.8 oz (107.4 kg)   SpO2 96%   BMI 36.01 kg/m   Wt Readings from Last 3 Encounters:  10/07/20 236 lb 12.8 oz (107.4 kg)  02/02/20 237 lb 12.8 oz (107.9 kg)  01/22/20 244 lb (110.7 kg)    Physical Exam  General appearance - alert, well appearing, and in no distress Mental status - normal mood, behavior, speech, dress, motor activity, and thought processes Neck - supple, no significant adenopathy Chest - clear to auscultation, no wheezes, rales or rhonchi, symmetric air entry Heart - normal rate, regular rhythm, normal S1, S2, no murmurs, rubs, clicks or gallops Musculoskeletal -no signs of tophaceous gout.  Knees: Joints are enlarged.  No point tenderness.  Mild crepitus on passive range of motion.  Hands are without signs of active tenosynovitis. Extremities -no lower extremity edema  CMP Latest Ref Rng & Units 10/09/2019 10/29/2017 11/06/2016  Glucose 65 - 99 mg/dL 153(H) 137(H) 188(H)  BUN 8 - 27 mg/dL 11 16 21   Creatinine 0.57 - 1.00 mg/dL 1.11(H) 1.25(H) 1.26(H)  Sodium 134 - 144 mmol/L 142 141 143  Potassium 3.5 - 5.2  mmol/L 4.2 4.4 4.5  Chloride 96 - 106 mmol/L 108(H) 108(H) 108(H)  CO2 20 - 29 mmol/L 18(L) 16(L) 21  Calcium 8.7 - 10.3 mg/dL 9.3 9.6 9.5  Total Protein 6.0 - 8.5 g/dL 7.0 7.2 -  Total Bilirubin 0.0 - 1.2 mg/dL 0.3 0.5 -  Alkaline Phos 39 - 117 IU/L 74 71 -  AST 0 - 40 IU/L 12 17 -  ALT 0 - 32 IU/L 10 11 -   Lipid Panel     Component Value Date/Time   CHOL 145 10/09/2019 0852   TRIG 97 10/09/2019 0852   HDL 47 10/09/2019 0852   CHOLHDL 3.1 10/09/2019 0852   CHOLHDL 3.5 09/05/2016 0228   VLDL 22 09/05/2016 0228   LDLCALC 80 10/09/2019 0852    CBC    Component Value Date/Time   WBC 5.5 10/09/2019 0852   WBC 5.4 09/08/2016 0252   RBC 3.91 10/09/2019 0852   RBC 3.11 (L) 09/08/2016 0252   HGB 11.8 10/09/2019 0852   HCT 34.7 10/09/2019 0852   PLT 263 10/09/2019 0852   MCV 89 10/09/2019 0852   MCH 30.2 10/09/2019 0852   MCH 28.3 09/08/2016 0252   MCHC 34.0 10/09/2019 0852   MCHC 31.9 09/08/2016 0252   RDW 13.2 10/09/2019 0852   LYMPHSABS 2.6 04/22/2009 2257   MONOABS 0.6 04/22/2009 2257   EOSABS 0.1 04/22/2009 2257   BASOSABS 0.0 04/22/2009 2257   Results for orders placed or performed in visit on 10/07/20  POCT glucose (manual entry)  Result Value Ref Range   POC Glucose 143 (A) 70 - 99 mg/dl   Opioid Risk  10/07/2020  Alcohol 0  Illegal Drugs 0  Rx Drugs 0  Alcohol 0  Illegal Drugs 0  Rx Drugs 0  Age between 16-45 years  0  History of Preadolescent Sexual Abuse  0  Psychological Disease 0  Depression 0  Opioid Risk Tool Scoring 0  Opioid Risk Interpretation Low Risk    ASSESSMENT AND PLAN: 1. Type 2 diabetes mellitus with obesity ([place]) Blood sugar readings are good.  Continue metformin, healthy eating habits and regular exercise as she has been doing. - POCT glucose (manual entry) - CBC - Comprehensive metabolic panel - Lipid panel - Microalbumin / creatinine urine ratio - Hemoglobin A1c; Future - Hemoglobin A1c  2. Hypertension associated with stage  3a chronic kidney disease due to type 2 diabetes mellitus ([place]) At goal.  Continue current medications and low-salt diet.  Continue to monitor kidney function.  She is on Indocin but uses it very rarely.  We have discussed the fact that this can adversely affect her kidney function  3. Hyperlipidemia associated with type 2 diabetes mellitus (HCC) Continue Lipitor.  4. History of gout Discussed checking uric acid level with goal being less than 6.  If it is not, then we can try her with a uric acid lowering agent like allopurinol or Uloric.  Patient declines having uric acid level checked stating that she has a flare at most only twice a year and she would prefer not to be on any other medications.  5. Coronary artery disease involving native coronary artery of native heart without angina pectoris Stable.  Continue statin, beta-blocker and aspirin  6. Primary osteoarthritis of both knees Stable on tramadol without significant side effects. She is due for update of controlled substance prescribing agreement which we did today. Opioid risk assessment completed and she is at low risk. [place] controlled substance reporting system reviewed and is appropriate. - traMADol (ULTRAM) 50 MG tablet; TAKE 1 TABLET (50 MG TOTAL) BY MOUTH DAILY AS NEEDED FOR MODERATE PAIN.  Dispense: 30 tablet; Refill: 0   Patient was given the opportunity to ask questions.  Patient verbalized understanding of the plan and was able to repeat key elements of the plan.   Orders Placed This Encounter  Procedures  . POCT glucose (manual entry)     Requested Prescriptions   Pending Prescriptions Disp Refills  . traMADol (ULTRAM) 50 MG tablet 30 tablet 0    Sig: TAKE 1 TABLET (50 MG TOTAL) BY MOUTH DAILY AS NEEDED FOR MODERATE PAIN.    No follow-ups on file.  Karle Plumber, MD, FACP

## 2020-10-07 NOTE — Patient Instructions (Signed)
Your goal for blood sugar before meals is 90-130. Your goal for blood sugar readings 2 hours after meals is less than 180.

## 2020-10-08 LAB — LIPID PANEL
Chol/HDL Ratio: 3.2 ratio (ref 0.0–4.4)
Cholesterol, Total: 146 mg/dL (ref 100–199)
HDL: 45 mg/dL (ref >39)
LDL Chol Calc (NIH): 82 mg/dL (ref 0–99)
Triglycerides: 100 mg/dL (ref 0–149)
VLDL Cholesterol Cal: 19 mg/dL (ref 5–40)

## 2020-10-08 LAB — COMPREHENSIVE METABOLIC PANEL
ALT: 11 IU/L (ref 0–32)
AST: 19 IU/L (ref 0–40)
Albumin/Globulin Ratio: 1.4 (ref 1.2–2.2)
Albumin: 4.1 g/dL (ref 3.7–4.7)
Alkaline Phosphatase: 94 IU/L (ref 44–121)
BUN/Creatinine Ratio: 11 — ABNORMAL LOW (ref 12–28)
BUN: 14 mg/dL (ref 8–27)
Bilirubin Total: 0.7 mg/dL (ref 0.0–1.2)
CO2: 21 mmol/L (ref 20–29)
Calcium: 9.7 mg/dL (ref 8.7–10.3)
Chloride: 106 mmol/L (ref 96–106)
Creatinine, Ser: 1.22 mg/dL — ABNORMAL HIGH (ref 0.57–1.00)
Globulin, Total: 3 g/dL (ref 1.5–4.5)
Glucose: 121 mg/dL — ABNORMAL HIGH (ref 65–99)
Potassium: 4.6 mmol/L (ref 3.5–5.2)
Sodium: 142 mmol/L (ref 134–144)
Total Protein: 7.1 g/dL (ref 6.0–8.5)
eGFR: 47 mL/min/{1.73_m2} — ABNORMAL LOW (ref >59)

## 2020-10-08 LAB — MICROALBUMIN / CREATININE URINE RATIO
Creatinine, Urine: 338.9 mg/dL
Microalb/Creat Ratio: 27 mg/g creat (ref 0–29)
Microalbumin, Urine: 91.9 ug/mL

## 2020-10-08 LAB — CBC
Hematocrit: 36.6 % (ref 34.0–46.6)
Hemoglobin: 11.7 g/dL (ref 11.1–15.9)
MCH: 28.5 pg (ref 26.6–33.0)
MCHC: 32 g/dL (ref 31.5–35.7)
MCV: 89 fL (ref 79–97)
Platelets: 246 10*3/uL (ref 150–450)
RBC: 4.1 x10E6/uL (ref 3.77–5.28)
RDW: 13.8 % (ref 11.7–15.4)
WBC: 5.5 10*3/uL (ref 3.4–10.8)

## 2020-10-08 LAB — HEMOGLOBIN A1C
Est. average glucose Bld gHb Est-mCnc: 137 mg/dL
Hgb A1c MFr Bld: 6.4 % — ABNORMAL HIGH (ref 4.8–5.6)

## 2020-11-04 ENCOUNTER — Other Ambulatory Visit: Payer: Self-pay | Admitting: Internal Medicine

## 2020-11-04 DIAGNOSIS — G5601 Carpal tunnel syndrome, right upper limb: Secondary | ICD-10-CM

## 2020-11-04 DIAGNOSIS — Z8739 Personal history of other diseases of the musculoskeletal system and connective tissue: Secondary | ICD-10-CM

## 2020-11-05 NOTE — Telephone Encounter (Signed)
Notes to clinic: Review medications for continued use and refill    Requested Prescriptions  Pending Prescriptions Disp Refills   gabapentin (NEURONTIN) 100 MG capsule [Pharmacy Med Name: GABAPENTIN 100 MG Capsule] 270 capsule 3    Sig: TAKE 3 CAPSULES AT BEDTIME      Neurology: Anticonvulsants - gabapentin Passed - 11/04/2020 11:31 AM      Passed - Valid encounter within last 12 months    Recent Outpatient Visits           4 weeks ago Type 2 diabetes mellitus with obesity ([place])   [place], Neoma Laming B, MD   4 months ago Type 2 diabetes mellitus with obesity [facility])   [place] Ladell Pier, MD   9 months ago Type 2 diabetes mellitus with obesity ([place])   [place] Ladell Pier, MD   9 months ago Need for Tdap vaccination   [place], RPH-CPP   1 year ago Controlled type 2 diabetes mellitus with complication, without long-term current use of insulin ([place])   [place], Dalbert Batman, MD                  indomethacin (INDOCIN) 50 MG capsule [Pharmacy Med Name: INDOMETHACIN 50 MG Capsule] 90 capsule 0    Sig: TAKE 1 CAPSULE TWICE DAILY AS NEEDED FOR ACUTE GOUT ATTACKS      Analgesics:  NSAIDS Failed - 11/04/2020 11:31 AM      Failed - Cr in normal range and within 360 days    Creat  Date Value Ref Range Status  08/03/2016 1.19 (H) 0.50 - 0.99 mg/dL Final    Comment:      For patients > or = 72 years of age: The upper reference limit for Creatinine is approximately 13% higher for people identified as African-American.      Creatinine, Ser  Date Value Ref Range Status  10/07/2020 1.22 (H) 0.57 - 1.00 mg/dL Final   Creatinine, Urine  Date Value Ref Range Status  07/09/2015 120 20 - 320 mg/dL Final          Passed - HGB in normal range and within 360 days     Hemoglobin  Date Value Ref Range Status  10/07/2020 11.7 11.1 - 15.9 g/dL Final          Passed - Patient is not pregnant      Passed - Valid encounter within last 12 months    Recent Outpatient Visits           4 weeks ago Type 2 diabetes mellitus with obesity ([place])   [place] Karle Plumber B, MD   4 months ago Type 2 diabetes mellitus with obesity [facility])   [place] Karle Plumber B, MD   9 months ago Type 2 diabetes mellitus with obesity [facility])   [place] Ladell Pier, MD   9 months ago Need for Tdap vaccination   [place], RPH-CPP   1 year ago Controlled type 2 diabetes mellitus with complication, without long-term current use of insulin [facility])   [place] [facility] Ladell Pier, MD

## 2020-11-07 ENCOUNTER — Telehealth: Payer: Self-pay | Admitting: Internal Medicine

## 2020-11-07 DIAGNOSIS — E1169 Type 2 diabetes mellitus with other specified complication: Secondary | ICD-10-CM

## 2020-11-07 DIAGNOSIS — E669 Obesity, unspecified: Secondary | ICD-10-CM

## 2020-11-07 NOTE — Telephone Encounter (Signed)
Copied from [place] [phone]. Topic: General - Other >> Nov 06, 2020  5:11 PM Mcneil, Ja-Kwan wrote: Reason for CRM: Pt stated her blood glucose monitor went out and she needs a Rx for a new monitor. Pt requests that the Rx be sent to [place], [place] Phone: [phone]   Fax: [phone]

## 2020-11-08 MED ORDER — ONETOUCH DELICA LANCETS 33G MISC: 2 refills | Status: DC

## 2020-11-08 MED ORDER — ONETOUCH VERIO W/DEVICE KIT: PACK | 0 refills | Status: AC

## 2020-11-08 MED ORDER — ONETOUCH VERIO VI STRP: ORAL_STRIP | 2 refills | Status: DC

## 2020-11-08 NOTE — Telephone Encounter (Signed)
Could you send in new rx

## 2020-11-08 NOTE — Telephone Encounter (Signed)
Rx sent 

## 2020-11-12 ENCOUNTER — Other Ambulatory Visit: Payer: Self-pay

## 2020-11-30 ENCOUNTER — Other Ambulatory Visit: Payer: Self-pay | Admitting: Internal Medicine

## 2020-11-30 DIAGNOSIS — M170 Bilateral primary osteoarthritis of knee: Secondary | ICD-10-CM

## 2021-01-01 ENCOUNTER — Other Ambulatory Visit: Payer: Self-pay

## 2021-01-02 ENCOUNTER — Other Ambulatory Visit: Payer: Self-pay

## 2021-02-07 ENCOUNTER — Ambulatory Visit: Payer: Medicare HMO | Admitting: Internal Medicine

## 2021-02-24 DIAGNOSIS — Z0100 Encounter for examination of eyes and vision without abnormal findings: Secondary | ICD-10-CM | POA: Diagnosis not present

## 2021-02-24 DIAGNOSIS — I10 Essential (primary) hypertension: Secondary | ICD-10-CM | POA: Diagnosis not present

## 2021-02-24 DIAGNOSIS — E119 Type 2 diabetes mellitus without complications: Secondary | ICD-10-CM | POA: Diagnosis not present

## 2021-02-24 DIAGNOSIS — H5203 Hypermetropia, bilateral: Secondary | ICD-10-CM | POA: Diagnosis not present

## 2021-02-24 DIAGNOSIS — H35033 Hypertensive retinopathy, bilateral: Secondary | ICD-10-CM | POA: Diagnosis not present

## 2021-03-20 ENCOUNTER — Other Ambulatory Visit: Payer: Self-pay | Admitting: Internal Medicine

## 2021-03-20 DIAGNOSIS — M170 Bilateral primary osteoarthritis of knee: Secondary | ICD-10-CM

## 2021-03-20 DIAGNOSIS — I2510 Atherosclerotic heart disease of native coronary artery without angina pectoris: Secondary | ICD-10-CM

## 2021-03-20 NOTE — Telephone Encounter (Signed)
Medication Refill - Medication:  traMADol (ULTRAM) 50 MG tablet  atorvastatin (LIPITOR) 10 MG tablet  Has the patient contacted their pharmacy? Yes.   Pharmacy called on behalf of pt  Preferred Pharmacy (with phone number or street name):  [place] [place]  Phone:  [phone] Fax:  [phone]  Has the patient been seen for an appointment in the last year OR does the patient have an upcoming appointment? Yes.    Agent: Please be advised that RX refills may take up to 3 business days. We ask that you follow-up with your pharmacy.

## 2021-03-21 MED ORDER — TRAMADOL HCL 50 MG PO TABS: ORAL_TABLET | ORAL | 0 refills | Status: DC

## 2021-03-21 MED ORDER — ATORVASTATIN CALCIUM 10 MG PO TABS: 10.0000 mg | ORAL_TABLET | Freq: Every day | ORAL | 1 refills | Status: DC

## 2021-03-21 NOTE — Telephone Encounter (Signed)
Requested medications are on the active medication list yes  Last visit 10/07/20  Future visit scheduled 04/08/21  Notes to clinic This medication can not be delegated, please assess.   Requested Prescriptions  Pending Prescriptions Disp Refills   traMADol (ULTRAM) 50 MG tablet 30 tablet 0    Sig: TAKE 1 TABLET (50 MG TOTAL) BY MOUTH DAILY AS NEEDED FOR MODERATE PAIN.     Not Delegated - Analgesics:  Opioid Agonists Failed - 03/20/2021  3:37 PM      Failed - This refill cannot be delegated      Failed - Urine Drug Screen completed in last 360 days      Passed - Valid encounter within last 6 months    Recent Outpatient Visits           5 months ago Type 2 diabetes mellitus with obesity ([place])   [place] Karle Plumber B, MD   9 months ago Type 2 diabetes mellitus with obesity [facility])   [place], Deborah B, MD   1 year ago Type 2 diabetes mellitus with obesity [facility])   [place] Ladell Pier, MD   1 year ago Need for Tdap vaccination   [place], RPH-CPP   1 year ago Controlled type 2 diabetes mellitus with complication, without long-term current use of insulin [facility])   [place] Ladell Pier, MD              Signed Prescriptions Disp Refills   atorvastatin (LIPITOR) 10 MG tablet 90 tablet 1    Sig: Take 1 tablet (10 mg total) by mouth daily.     Cardiovascular:  Antilipid - Statins Passed - 03/20/2021  3:37 PM      Passed - Total Cholesterol in normal range and within 360 days    Cholesterol, Total  Date Value Ref Range Status  10/07/2020 146 100 - 199 mg/dL Final          Passed - LDL in normal range and within 360 days    LDL Chol Calc (NIH)  Date Value Ref Range Status  10/07/2020 82 0 - 99 mg/dL Final          Passed - HDL in normal range and within 360 days     HDL  Date Value Ref Range Status  10/07/2020 45 >39 mg/dL Final          Passed - Triglycerides in normal range and within 360 days    Triglycerides  Date Value Ref Range Status  10/07/2020 100 0 - 149 mg/dL Final          Passed - Patient is not pregnant      Passed - Valid encounter within last 12 months    Recent Outpatient Visits           5 months ago Type 2 diabetes mellitus with obesity ([place])   [place] Karle Plumber B, MD   9 months ago Type 2 diabetes mellitus with obesity [facility])   [place], Deborah B, MD   1 year ago Type 2 diabetes mellitus with obesity [facility])   [place] Ladell Pier, MD   1 year ago Need for Tdap vaccination   [place], Annie Main  L, RPH-CPP   1 year ago Controlled type 2 diabetes mellitus with complication, without long-term current use of insulin [facility])   [place] [facility] Ladell Pier, MD

## 2021-03-21 NOTE — Telephone Encounter (Signed)
Requested Prescriptions  Pending Prescriptions Disp Refills  . atorvastatin (LIPITOR) 10 MG tablet 90 tablet 1    Sig: Take 1 tablet (10 mg total) by mouth daily.     Cardiovascular:  Antilipid - Statins Passed - 03/20/2021  3:37 PM      Passed - Total Cholesterol in normal range and within 360 days    Cholesterol, Total  Date Value Ref Range Status  10/07/2020 146 100 - 199 mg/dL Final         Passed - LDL in normal range and within 360 days    LDL Chol Calc (NIH)  Date Value Ref Range Status  10/07/2020 82 0 - 99 mg/dL Final         Passed - HDL in normal range and within 360 days    HDL  Date Value Ref Range Status  10/07/2020 45 >39 mg/dL Final         Passed - Triglycerides in normal range and within 360 days    Triglycerides  Date Value Ref Range Status  10/07/2020 100 0 - 149 mg/dL Final         Passed - Patient is not pregnant      Passed - Valid encounter within last 12 months    Recent Outpatient Visits          5 months ago Type 2 diabetes mellitus with obesity ([place])   [place] Karle Plumber B, MD   9 months ago Type 2 diabetes mellitus with obesity [facility])   [place], Deborah B, MD   1 year ago Type 2 diabetes mellitus with obesity [facility])   [place] Ladell Pier, MD   1 year ago Need for Tdap vaccination   [place], RPH-CPP   1 year ago Controlled type 2 diabetes mellitus with complication, without long-term current use of insulin ([place])   [place], Neoma Laming B, MD             . traMADol (ULTRAM) 50 MG tablet 30 tablet 0    Sig: TAKE 1 TABLET (50 MG TOTAL) BY MOUTH DAILY AS NEEDED FOR MODERATE PAIN.     Not Delegated - Analgesics:  Opioid Agonists Failed - 03/20/2021  3:37 PM      Failed - This refill cannot be delegated      Failed - Urine  Drug Screen completed in last 360 days      Passed - Valid encounter within last 6 months    Recent Outpatient Visits          5 months ago Type 2 diabetes mellitus with obesity ([place])   [place] Karle Plumber B, MD   9 months ago Type 2 diabetes mellitus with obesity [facility])   [place], Deborah B, MD   1 year ago Type 2 diabetes mellitus with obesity [facility])   [place] Ladell Pier, MD   1 year ago Need for Tdap vaccination   [place], RPH-CPP   1 year ago Controlled type 2 diabetes mellitus with complication, without long-term current use of insulin [facility])   [place] [facility] Ladell Pier, MD

## 2021-03-30 ENCOUNTER — Other Ambulatory Visit: Payer: Self-pay | Admitting: Internal Medicine

## 2021-03-30 DIAGNOSIS — Z8739 Personal history of other diseases of the musculoskeletal system and connective tissue: Secondary | ICD-10-CM

## 2021-03-30 NOTE — Telephone Encounter (Signed)
Requested Prescriptions  Pending Prescriptions Disp Refills  . indomethacin (INDOCIN) 50 MG capsule [Pharmacy Med Name: INDOMETHACIN 50 MG Capsule] 90 capsule 0    Sig: TAKE 1 CAPSULE TWICE DAILY AS NEEDED FOR ACUTE GOUT ATTACKS     Analgesics:  NSAIDS Failed - 03/30/2021  3:26 AM      Failed - Cr in normal range and within 360 days    Creat  Date Value Ref Range Status  08/03/2016 1.19 (H) 0.50 - 0.99 mg/dL Final    Comment:      For patients > or = 72 years of age: The upper reference limit for Creatinine is approximately 13% higher for people identified as African-American.      Creatinine, Ser  Date Value Ref Range Status  10/07/2020 1.22 (H) 0.57 - 1.00 mg/dL Final   Creatinine, Urine  Date Value Ref Range Status  07/09/2015 120 20 - 320 mg/dL Final         Passed - HGB in normal range and within 360 days    Hemoglobin  Date Value Ref Range Status  10/07/2020 11.7 11.1 - 15.9 g/dL Final         Passed - Patient is not pregnant      Passed - Valid encounter within last 12 months    Recent Outpatient Visits          5 months ago Type 2 diabetes mellitus with obesity ([place])   [place] Karle Plumber B, MD   9 months ago Type 2 diabetes mellitus with obesity [facility])   [place], Deborah B, MD   1 year ago Type 2 diabetes mellitus with obesity [facility])   [place] Ladell Pier, MD   1 year ago Need for Tdap vaccination   [place], RPH-CPP   1 year ago Controlled type 2 diabetes mellitus with complication, without long-term current use of insulin [facility])   [place] [facility] Ladell Pier, MD

## 2021-04-08 ENCOUNTER — Encounter: Payer: Self-pay | Admitting: Internal Medicine

## 2021-04-08 ENCOUNTER — Other Ambulatory Visit: Payer: Self-pay

## 2021-04-08 ENCOUNTER — Ambulatory Visit: Payer: Medicare HMO | Attending: Internal Medicine | Admitting: Internal Medicine

## 2021-04-08 VITALS — BP 136/75 | HR 74 | Resp 16 | Ht 70.5 in | Wt 232.6 lb

## 2021-04-08 DIAGNOSIS — Z0000 Encounter for general adult medical examination without abnormal findings: Secondary | ICD-10-CM | POA: Diagnosis not present

## 2021-04-08 DIAGNOSIS — Z23 Encounter for immunization: Secondary | ICD-10-CM

## 2021-04-08 NOTE — Patient Instructions (Signed)
You will receive your flu shot today. You can get the new COVID booster vaccine at any outside pharmacy including [facility] or [facility] We will plan to give the first shot of the shingles vaccine on your follow-up visit. Continue healthy eating habits and regular exercise. Want to have done your living well or healthcare power of attorney, please consider bringing a copy for [place] to put in your medical record.

## 2021-04-08 NOTE — Progress Notes (Signed)
Subjective:   Desiree Sanchez is a 70 y.o. female who presents for Medicare Annual (Subsequent) preventive examination. Pt with hx of HTN, HL, DM, CKD stage 3, insomnia non-obstructing CAD (cath 08/2016, for med management), RT CTS, gout, IDA.  Review of Systems       Objective:    Today's Vitals   04/08/21 1525  BP: 136/75  Pulse: 74  Resp: 16  SpO2: 98%  Weight: 232 lb 9.6 oz (105.5 kg)  Height: 5' 10.5" (1.791 m)   Wt Readings from Last 3 Encounters:  04/08/21 232 lb 9.6 oz (105.5 kg)  10/07/20 236 lb 12.8 oz (107.4 kg)  02/02/20 237 lb 12.8 oz (107.9 kg)  Body mass index is 32.9 kg/m.  General: Pleasant elderly African-American female in NAD. Neck: No cervical lymphadenopathy.  No thyromegaly. Chest: Clear to auscultation bilaterally. CVS: Regular rate and rhythm. Extremities: No lower extremity edema.  Advanced Directives 04/08/2021 01/22/2020 02/16/2017 11/06/2016 09/14/2016 09/04/2016 08/03/2016  Does Patient Have a Medical Advance Directive? No Yes _0   Type of Advance Directive - [place] in Chart? - No - copy requested - - - - -  Would patient like information on creating a medical advance directive? No - Patient declined - No - Patient declined - - No - Patient declined -  Pt in process of making up Living Will and [place].   Current Medications (verified) Outpatient Encounter Medications as of 04/08/2021  Medication Sig   albuterol (PROVENTIL HFA;VENTOLIN HFA) 108 (90 Base) MCG/ACT inhaler Inhale 2 puffs into the lungs every 6 (six) hours as needed for wheezing or shortness of breath.   aspirin EC 81 MG tablet Take 1 tablet (81 mg total) by mouth daily.   atorvastatin (LIPITOR) 10 MG tablet Take 1 tablet (10 mg total) by mouth daily.   Blood Glucose Monitoring Suppl (ONETOUCH VERIO) w/Device KIT Use to check blood sugar once daily.   Blood Pressure KIT 1 each by Does not apply route  daily.   carvedilol (COREG) 25 MG tablet TAKE 1 TABLET BY MOUTH 2  TIMES DAILY WITH A MEAL.   colchicine 0.6 MG tablet Take 1 tablet (0.6 mg total) by mouth 2 (two) times daily as needed (gout flare).   diclofenac Sodium (VOLTAREN) 1 % GEL APPLY 2 GRAMS TOPICALLY FOUR TIMES DAILY   gabapentin (NEURONTIN) 100 MG capsule TAKE 3 CAPSULES AT BEDTIME   glucose blood (ONETOUCH VERIO) test strip Use to check blood sugar once daily.   hydrALAZINE (APRESOLINE) 25 MG tablet Take 1.5 tablets (37.5 mg total) by mouth 3 (three) times daily.   indomethacin (INDOCIN) 50 MG capsule TAKE 1 CAPSULE TWICE DAILY AS NEEDED FOR ACUTE GOUT ATTACKS   isosorbide dinitrate (ISORDIL) 20 MG tablet Take 1 tablet (20 mg total) by mouth 3 (three) times daily.   loratadine (CLARITIN) 10 MG tablet Take 1 tablet (10 mg total) by mouth daily as needed for allergies.   metFORMIN (GLUCOPHAGE-XR) 500 MG 24 hr tablet TAKE 2 TABLETS BY MOUTH DAILY WITH BREAKFAST.   OneTouch Delica Lancets 77L MISC Use to check blood sugar once daily.   traMADol (ULTRAM) 50 MG tablet TAKE 1 TABLET (50 MG TOTAL) BY MOUTH DAILY AS NEEDED FOR MODERATE PAIN.   Triamcinolone Acetonide (TRIAMCINOLONE 0.1 % CREAM : EUCERIN) CREA Apply 1 application topically 2 (two) times daily as needed. Please mix 1:1 (Patient taking differently:  Apply 1 application topically 2 (two) times daily as needed. Please mix 1:1)   triamcinolone cream (KENALOG) 0.1 % APPLY TOPICALLY TWICE DAILY AS NEEDED.   No facility-administered encounter medications on file as of 04/08/2021.    Allergies (verified) Lisinopril, Hctz [hydrochlorothiazide], Naproxen, and Norvasc [amlodipine besylate]   History: Past Medical History:  Diagnosis Date   Arthritis Dx 2006   Carpal tunnel syndrome    Diabetes mellitus without complication ([place]) Dx 9371   Dyspnea    Gout    Heart murmur    Hypertension Dx 2005   Unstable angina ([place]) 08/2016   Past Surgical History:  Procedure Laterality  Date   APPENDECTOMY     INTRAVASCULAR PRESSURE WIRE/FFR STUDY N/A 09/07/2016   Procedure: Intravascular Pressure Wire/FFR Study;  Surgeon: Nelva Bush, MD;  Location: [place] [facility];  Service: Cardiovascular;  Laterality: N/A;   LEFT HEART CATH AND CORONARY ANGIOGRAPHY N/A 09/07/2016   Procedure: Left Heart Cath and Coronary Angiography;  Surgeon: Nelva Bush, MD;  Location: [place] [facility];  Service: Cardiovascular;  Laterality: N/A;   TUBAL LIGATION     Family History  Problem Relation Age of Onset   Diabetes Mother    Diabetes Sister    Social History   Socioeconomic History   Marital status: Single    Spouse name: Not on file   Number of children: Not on file   Years of education: Not on file   Highest education level: Not on file  Occupational History   Not on file  Tobacco Use   Smoking status: Former    Types: Cigarettes    Quit date: 2009    Years since quitting: 13.8   Smokeless tobacco: Never  Substance and Sexual Activity   Alcohol use: Yes    Comment: occasionally   Drug use: No   Sexual activity: Not on file  Other Topics Concern   Not on file  Social History Narrative   Not on file   Social Determinants of Health   Financial Resource Strain: Not on file  Food Insecurity: Not on file  Transportation Needs: Not on file  Physical Activity: Not on file  Stress: Not on file  Social Connections: Not on file    Tobacco Counseling Pt does not smoke Pt does not drink ETOH beverages  Clinical Intake:  Pain : No/denies pain    Diabetic? Eats a lot of fruits and veggies, has cut back on carb, sweet drinks and sweets Checks BS 3x/wk.  Last reading was 97.  Highest reading was 180 after she ate.  Activities of Daily Living In your present state of health, do you have any difficulty performing the following activities: 04/08/2021  Hearing? N  Vision? N  Difficulty concentrating or making decisions? N  Walking or climbing stairs? N  Dressing  or bathing? N  Doing errands, shopping? N  Preparing Food and eating ? N  Using the Toilet? N  In the past six months, have you accidently leaked urine? N  Do you have problems with loss of bowel control? N  Managing your Medications? N  Managing your Finances? N  Housekeeping or managing your Housekeeping? N  Some recent data might be hidden   Patient Care Team: Ladell Pier, MD as PCP - General (Internal Medicine) [place], Peter M, MD as PCP - Cardiology (Cardiology) Marica Otter, optometrist Indicate any recent Medical Services you may have received from other than [facility] providers in the past year (date may  be approximate).     Assessment:   This is a routine wellness examination for [place].  Hearing/Vision screen Vision Screening   Right eye Left eye Both eyes  Without correction     With correction _0 Reports having had eye exam 02/2021 with Dr. Sabra Heck.  No retinopathy. Did get new glasses.   Dietary issues and exercise activities discussed:  Eats a lot of fruits and veggies, has cut back on carb, sweet drinks and sweets Checks BS 3x/wk.  Last reading was 97.  Highest reading was 180 after she ate. Walks daily for 10-15 mins with her dog and goes to [facility] 3x/wk to swim   Goals Addressed   None   Depression Screen PHQ 2/9 Scores 04/08/2021 10/07/2020 02/02/2020 01/22/2020 10/03/2019 08/05/2018 03/25/2018  PHQ - 2 Score 0 0 0 0 0 0 0  PHQ- 9 Score - - - - - - -    Fall Risk Fall Risk  04/08/2021 10/07/2020 02/02/2020 01/22/2020 10/03/2019  Falls in the past year? 0 0 0 0 0  Number falls in past yr: 0 0 0 0 -  Injury with Fall? 0 0 0 0 -  Risk for fall due to : No Fall Risks No Fall Risks - - -    FALL RISK PREVENTION PERTAINING TO THE HOME:  Any stairs in or around the home? Yes  If so, are there any without handrails? Yes  Home free of loose throw rugs in walkways, pet beds, electrical cords, etc? Yes  Adequate lighting in your home to reduce risk of  falls? Yes   ASSISTIVE DEVICES UTILIZED TO PREVENT FALLS:  Life alert? No  and does not feel she needs one Use of a cane, walker or w/c? No  Grab bars in the bathroom? Yes  Shower chair or bench in shower? No  Elevated toilet seat or a handicapped toilet? No   TIMED UP AND GO:  Was the test performed? Yes .  Length of time to ambulate 10 feet: 8 sec.   Gait slow and steady with assistive device  Cognitive Function: MMSE - Mini Mental State Exam 04/08/2021 01/22/2020  Orientation to time 5 5  Orientation to Place 5 5  Registration 3 3  Attention/ Calculation 5 5  Recall 3 3  Language- name 2 objects 2 2  Language- repeat 1 1  Language- follow 3 step command 3 3  Language- read & follow direction 1 1  Write a sentence 1 1  Copy design 0 0  Total score 29 29        Immunizations Immunization History  Administered Date(s) Administered   Influenza Split 03/13/2013   Influenza Whole 04/22/2009   Influenza,inj,Quad PF,6+ Mos 07/09/2015, 02/11/2016, 02/16/2017, 03/25/2018, 02/09/2019, 03/20/2020, 04/08/2021   PFIZER(Purple Top)SARS-COV-2 Vaccination 07/13/2019, 08/03/2019, 04/09/2020, 09/30/2020   Pneumococcal Conjugate-13 02/16/2017   Pneumococcal Polysaccharide-23 04/22/2009, 01/03/2015   Td 04/22/2009   Tdap 01/22/2020    TDAP status: Up to date  Flu Vaccine status: Completed at today's visit  Pneumococcal vaccine status: Up to date  Covid-19 vaccine status: Completed vaccines She plans to get the new COVID booster today or tomorrow at an outside pharmacy.  Qualifies for Shingles Vaccine? Yes   Zostavax completed No   Shingrix Completed?: No.    Education has been provided regarding the importance of this vaccine. Patient has been advised to call insurance company to determine out of pocket expense if they have not yet received this  vaccine. Advised may also receive vaccine at local pharmacy or Health Dept. Verbalized acceptance and understanding.  Screening  Tests Health Maintenance  Topic Date Due   Zoster Vaccines- Shingrix (1 of 2) Never done   COVID-19 Vaccine (5 - Booster for Pfizer series) 11/25/2020   FOOT EXAM  02/01/2021   OPHTHALMOLOGY EXAM  02/21/2021   HEMOGLOBIN A1C  04/09/2021   MAMMOGRAM  06/13/2022   COLONOSCOPY (Pts 45-68yr Insurance coverage will need to be confirmed)  11/14/2023   TETANUS/TDAP  01/21/2030   Pneumonia Vaccine 72 Years old  Completed   INFLUENZA VACCINE  Completed   DEXA SCAN  Completed   Hepatitis C Screening  Completed   HPV VACCINES  Aged Out    Health Maintenance  Health Maintenance Due  Topic Date Due   Zoster Vaccines- Shingrix (1 of 2) Never done   COVID-19 Vaccine (5 - Booster for Pfizer series) 11/25/2020   FOOT EXAM  02/01/2021   OPHTHALMOLOGY EXAM  02/21/2021    Colorectal cancer screening: Type of screening: Colonoscopy. Completed 11/2013. Repeat every 10 years  Mammogram status: Completed 06/2020. Repeat every year 1-2 yrs  Bone Density status: Completed 06/2020. Results reflect: Bone density results: OSTEOPENIA. Repeat every 3 years.  Lung Cancer Screening: (Low Dose CT Chest recommended if Age 72-80years, 30 pack-year currently smoking OR have quit w/in 15years.) does not qualify.   Lung Cancer Screening Referral: NA  Additional Screening:  Hepatitis C Screening: does qualify; Completed 06/2015  Vision Screening: Recommended annual ophthalmology exams for early detection of glaucoma and other disorders of the eye. Is the patient up to date with their annual eye exam?  Yes  Who is the provider or what is the name of the office in which the patient attends annual eye exams?  Optometrist SMarica OtterIf pt is not established with a provider, would they like to be referred to a provider to establish care?  NA .   Dental Screening: Recommended annual dental exams for proper oral hygiene.  Wears dentures so does not follow with a dentist                       Community Resource  Referral / Chronic Care Management: CRR required this visit?  No   CCM required this visit?  No      Plan:    1. Encounter for Medicare annual wellness exam You will receive your flu shot today. You can get the new COVID booster vaccine at any outside pharmacy including [facility] or [facility] We will plan to give the first shot of the shingles vaccine on your follow-up visit. Continue healthy eating habits and regular exercise. Once you have done your living well or healthcare power of attorney, please consider bringing a copy for [place] put in your medical record.   2. Need for immunization against influenza - Flu Vaccine QUAD High Dose(Fluad)  I have personally reviewed and noted the following in the patient's chart:   Medical and social history Use of alcohol, tobacco or illicit drugs  Current medications and supplements including opioid prescriptions.  Functional ability and status Nutritional status Physical activity Advanced directives List of other physicians Hospitalizations, surgeries, and ER visits in previous 12 months Vitals Screenings to include cognitive, depression, and falls Referrals and appointments  In addition, I have reviewed and discussed with patient certain preventive protocols, quality metrics, and best practice recommendations. A written personalized care plan for preventive services as well  as general preventive health recommendations were provided to patient.     Karle Plumber, MD   04/08/2021

## 2021-04-26 ENCOUNTER — Telehealth (INDEPENDENT_AMBULATORY_CARE_PROVIDER_SITE_OTHER): Payer: Self-pay

## 2021-04-26 NOTE — Telephone Encounter (Signed)
Copied from [place] [id number]. Topic: General - Other >> Apr 25, 2021  3:27 PM Yvette Rack wrote: Reason for CRM: Tonya with [facility] requests update on Rx request for albuterol (PROVENTIL HFA;VENTOLIN HFA) 90 MCG inhaler that was faxed on 04/21/21.

## 2021-04-28 ENCOUNTER — Other Ambulatory Visit: Payer: Self-pay

## 2021-04-28 MED ORDER — ALBUTEROL SULFATE HFA 108 (90 BASE) MCG/ACT IN AERS: 2.0000 | INHALATION_SPRAY | Freq: Four times a day (QID) | RESPIRATORY_TRACT | 2 refills | Status: DC | PRN

## 2021-04-28 NOTE — Telephone Encounter (Signed)
Sent a refill of Albuterol to [facility]

## 2021-05-02 ENCOUNTER — Other Ambulatory Visit: Payer: Self-pay | Admitting: Internal Medicine

## 2021-05-02 DIAGNOSIS — I2510 Atherosclerotic heart disease of native coronary artery without angina pectoris: Secondary | ICD-10-CM

## 2021-05-02 DIAGNOSIS — I10 Essential (primary) hypertension: Secondary | ICD-10-CM

## 2021-05-03 NOTE — Telephone Encounter (Signed)
Requested Prescriptions  Pending Prescriptions Disp Refills  . carvedilol (COREG) 25 MG tablet [Pharmacy Med Name: CARVEDILOL 25 MG Tablet] 180 tablet 1    Sig: TAKE 1 TABLET BY MOUTH 2 TIMES DAILY WITH MEALS     Cardiovascular:  Beta Blockers Failed - 05/02/2021  8:03 AM      Failed - Valid encounter within last 6 months    Recent Outpatient Visits          3 weeks ago Need for immunization against influenza   [place] Karle Plumber B, MD   6 months ago Type 2 diabetes mellitus with obesity [facility])   [place] Karle Plumber B, MD   10 months ago Type 2 diabetes mellitus with obesity [facility])   [place], Deborah B, MD   1 year ago Type 2 diabetes mellitus with obesity [facility])   [place] Ladell Pier, MD   1 year ago Need for Tdap vaccination   [place], RPH-CPP      Future Appointments            In 1 month Ladell Pier, MD [place] BP in normal range    BP Readings from Last 1 Encounters:  04/08/21 136/75         Passed - Last Heart Rate in normal range    Pulse Readings from Last 1 Encounters:  04/08/21 74

## 2021-05-09 ENCOUNTER — Other Ambulatory Visit: Payer: Self-pay | Admitting: Internal Medicine

## 2021-05-19 ENCOUNTER — Other Ambulatory Visit: Payer: Self-pay | Admitting: Internal Medicine

## 2021-05-19 DIAGNOSIS — E118 Type 2 diabetes mellitus with unspecified complications: Secondary | ICD-10-CM

## 2021-05-19 NOTE — Telephone Encounter (Signed)
Requested Prescriptions  Pending Prescriptions Disp Refills  . metFORMIN (GLUCOPHAGE-XR) 500 MG 24 hr tablet [Pharmacy Med Name: METFORMIN HYDROCHLORIDE ER 500 MG Tablet Extended Release 24 Hour] 180 tablet 0    Sig: TAKE 2 TABLETS BY MOUTH DAILY WITH BREAKFAST.     Endocrinology:  Diabetes - Biguanides Failed - 05/19/2021  3:28 AM      Failed - Cr in normal range and within 360 days    Creat  Date Value Ref Range Status  08/03/2016 1.19 (H) 0.50 - 0.99 mg/dL Final    Comment:      For patients > or = 72 years of age: The upper reference limit for Creatinine is approximately 13% higher for people identified as African-American.      Creatinine, Ser  Date Value Ref Range Status  10/07/2020 1.22 (H) 0.57 - 1.00 mg/dL Final   Creatinine, Urine  Date Value Ref Range Status  07/09/2015 120 20 - 320 mg/dL Final         Failed - HBA1C is between 0 and 7.9 and within 180 days    HbA1c, POC (controlled diabetic range)  Date Value Ref Range Status  08/05/2018 7.6 (A) 0.0 - 7.0 % Final   Hgb A1c MFr Bld  Date Value Ref Range Status  10/07/2020 6.4 (H) 4.8 - 5.6 % Final    Comment:             Prediabetes: 5.7 - 6.4          Diabetes: >6.4          Glycemic control for adults with diabetes: <7.0          Failed - AA eGFR in normal range and within 360 days    GFR, Est African American  Date Value Ref Range Status  08/03/2016 55 (L) >=60 mL/min Final   GFR calc Af Amer  Date Value Ref Range Status  10/09/2019 58 (L) >59 mL/min/1.73 Final    Comment:    **[facility] currently reports eGFR in compliance with the current**   recommendations of the [employer]. [facility] will   update reporting as new guidelines are published from the NKF-ASN   Task force.    GFR, Est Non African American  Date Value Ref Range Status  08/03/2016 47 (L) >=60 mL/min Final   GFR calc non Af Amer  Date Value Ref Range Status  10/09/2019 50 (L) >59 mL/min/1.73 Final   eGFR  Date  Value Ref Range Status  10/07/2020 47 (L) >59 mL/min/1.73 Final         Failed - Valid encounter within last 6 months    Recent Outpatient Visits          1 month ago Need for immunization against influenza   [place], MD   7 months ago Type 2 diabetes mellitus with obesity [facility])   [place] Karle Plumber B, MD   11 months ago Type 2 diabetes mellitus with obesity [facility])   [place] Ladell Pier, MD   1 year ago Type 2 diabetes mellitus with obesity [facility])   [place] Ladell Pier, MD   1 year ago Need for Tdap vaccination   [place], RPH-CPP      Future Appointments            In  2 weeks Ladell Pier, MD [place]

## 2021-06-03 ENCOUNTER — Ambulatory Visit: Payer: Medicare HMO | Attending: Internal Medicine | Admitting: Internal Medicine

## 2021-06-03 ENCOUNTER — Other Ambulatory Visit: Payer: Self-pay

## 2021-06-03 VITALS — BP 122/79 | HR 67 | Ht 70.0 in | Wt 232.4 lb

## 2021-06-03 DIAGNOSIS — E1159 Type 2 diabetes mellitus with other circulatory complications: Secondary | ICD-10-CM | POA: Diagnosis not present

## 2021-06-03 DIAGNOSIS — N1831 Chronic kidney disease, stage 3a: Secondary | ICD-10-CM | POA: Diagnosis not present

## 2021-06-03 DIAGNOSIS — I2510 Atherosclerotic heart disease of native coronary artery without angina pectoris: Secondary | ICD-10-CM | POA: Diagnosis not present

## 2021-06-03 DIAGNOSIS — I152 Hypertension secondary to endocrine disorders: Secondary | ICD-10-CM | POA: Diagnosis not present

## 2021-06-03 DIAGNOSIS — Z23 Encounter for immunization: Secondary | ICD-10-CM | POA: Diagnosis not present

## 2021-06-03 DIAGNOSIS — E1169 Type 2 diabetes mellitus with other specified complication: Secondary | ICD-10-CM

## 2021-06-03 DIAGNOSIS — Z8739 Personal history of other diseases of the musculoskeletal system and connective tissue: Secondary | ICD-10-CM

## 2021-06-03 DIAGNOSIS — E669 Obesity, unspecified: Secondary | ICD-10-CM | POA: Diagnosis not present

## 2021-06-03 LAB — POCT GLYCOSYLATED HEMOGLOBIN (HGB A1C): HbA1c, POC (controlled diabetic range): 6.5 % (ref 0.0–7.0)

## 2021-06-03 LAB — GLUCOSE, POCT (MANUAL RESULT ENTRY): POC Glucose: 119 mg/dl — AB (ref 70–99)

## 2021-06-03 MED ORDER — ZOSTER VAC RECOMB ADJUVANTED 50 MCG/0.5ML IM SUSR
0.5000 mL | Freq: Once | INTRAMUSCULAR | 0 refills | Status: AC
Start: 2021-06-03 — End: 2021-06-03

## 2021-06-03 NOTE — Progress Notes (Signed)
Patient ID: Desiree Sanchez, female    DOB: 1951-05-05  MRN: [id number]  CC: Hypertension and Diabetes   Subjective: Desiree Sanchez is a 70 y.o. female who presents for chronic ds management Her concerns today include:  Pt with hx of HTN, HL, DM, CKD stage 3, insomnia non-obstructing CAD (cath 08/2016, for med management), RT CTS, OA knees, gout, IDA.  DIABETES TYPE 2 Last A1C:   Results for orders placed or performed in visit on 06/03/21  POCT glucose (manual entry)  Result Value Ref Range   POC Glucose 119 (A) 70 - 99 mg/dl  POCT glycosylated hemoglobin (Hb A1C)  Result Value Ref Range   Hemoglobin A1C     HbA1c POC (<> result, manual entry)     HbA1c, POC (prediabetic range)     HbA1c, POC (controlled diabetic range) 6.5 0.0 - 7.0 %    Med Adherence:  [x]  Yes -Metformin   []  No Medication side effects:  []  Yes    [x]  No Home Monitoring?  [x]  Yes 2-3x/wk   []  No Home glucose results range: 90-100 Diet Adherence: [x]  Yes  but eat a lillte more over the holidays  []  No Exercise: [x]  Yes  -[employer] 3x/wk to swim  []  No Hypoglycemic episodes?: []  Yes    [x]  No Numbness of the feet? []  Yes    []  No Retinopathy hx? []  Yes    []  No Last eye exam:  Comments:   HYPERTENSION/CAD Currently taking: see medication list.  She is on carvedilol, isosorbide, atorvastatin, aspirin, hydralazine Med Adherence: [x]  Yes    []  No Medication side effects: []  Yes    [x]  No Adherence with salt restriction: [x]  Yes    []  No Home Monitoring?: [x]  Yes    []  No Monitoring Frequency: not often because her device sometimes does not work.  Plans to get a new one Home BP results range:  SOB? []  Yes    [x]  No Chest Pain?: []  Yes    [x]  No Leg swelling?: []  Yes    [x]  No Headaches?: []  Yes    [x]  No Dizziness? []  Yes    [x]  No Comments:   CKD:  last GFR 47/Creat 1.22.  Uses Indocin sparingly. Had a gout attack last wk in LT foot and that was the first one she had in a while. Had to use Indocin twice  for 1 day and that took care of it. Drinks adequate fluids  HM:  due for Shingrix  Patient Active Problem List   Diagnosis Date Noted   Hypertension associated with stage 3a chronic kidney disease due to type 2 diabetes mellitus ([place]) 10/07/2020   Hyperlipidemia associated with type 2 diabetes mellitus ([place]) 10/07/2020   Arthritis of finger of left hand 08/05/2018   Primary osteoarthritis of both knees 10/29/2017   CAD (coronary artery disease) 02/08/2017   Insomnia 11/06/2016   Iron deficiency anemia 09/14/2016   Shoulder pain, left 02/11/2016   Right carpal tunnel syndrome 07/29/2014   Vitamin D insufficiency 04/13/2014   Obesity (BMI 30-39.9) 03/13/2013   Gout 05/20/2009   HEART MURMUR, SYSTOLIC 53/97/6734   DM2 (diabetes mellitus, type 2) ([place]) 04/22/2009   Essential hypertension 04/22/2009     Current Outpatient Medications on File Prior to Visit  Medication Sig Dispense Refill   albuterol (VENTOLIN HFA) 108 (90 Base) MCG/ACT inhaler Inhale 2 puffs into the lungs every 6 (six) hours as needed for wheezing or shortness of breath. 3 each 2  aspirin EC 81 MG tablet Take 1 tablet (81 mg total) by mouth daily. 90 tablet 3   atorvastatin (LIPITOR) 10 MG tablet Take 1 tablet (10 mg total) by mouth daily. 90 tablet 1   Blood Glucose Monitoring Suppl (ONETOUCH VERIO) w/Device KIT Use to check blood sugar once daily. 1 kit 0   Blood Pressure KIT 1 each by Does not apply route daily. 1 each 0   carvedilol (COREG) 25 MG tablet TAKE 1 TABLET BY MOUTH 2 TIMES DAILY WITH MEALS 180 tablet 1   colchicine 0.6 MG tablet Take 1 tablet (0.6 mg total) by mouth 2 (two) times daily as needed (gout flare). 180 tablet 0   diclofenac Sodium (VOLTAREN) 1 % GEL APPLY 2 GRAMS TOPICALLY FOUR TIMES DAILY 700 g 0   gabapentin (NEURONTIN) 100 MG capsule TAKE 3 CAPSULES AT BEDTIME 270 capsule 3   glucose blood (ONETOUCH VERIO) test strip Use to check blood sugar once daily. 100 each 2   hydrALAZINE  (APRESOLINE) 25 MG tablet Take 1.5 tablets (37.5 mg total) by mouth 3 (three) times daily. 405 tablet 3   indomethacin (INDOCIN) 50 MG capsule TAKE 1 CAPSULE TWICE DAILY AS NEEDED FOR ACUTE GOUT ATTACKS 90 capsule 0   isosorbide dinitrate (ISORDIL) 20 MG tablet Take 1 tablet (20 mg total) by mouth 3 (three) times daily. 270 tablet 3   loratadine (CLARITIN) 10 MG tablet Take 1 tablet (10 mg total) by mouth daily as needed for allergies. 90 tablet 2   metFORMIN (GLUCOPHAGE-XR) 500 MG 24 hr tablet TAKE 2 TABLETS BY MOUTH DAILY WITH BREAKFAST. 180 tablet 0   OneTouch Delica Lancets 42V MISC Use to check blood sugar once daily. 100 each 2   traMADol (ULTRAM) 50 MG tablet TAKE 1 TABLET (50 MG TOTAL) BY MOUTH DAILY AS NEEDED FOR MODERATE PAIN. 30 tablet 0   Triamcinolone Acetonide (TRIAMCINOLONE 0.1 % CREAM : EUCERIN) CREA Apply 1 application topically 2 (two) times daily as needed. Please mix 1:1 (Patient taking differently: Apply 1 application topically 2 (two) times daily as needed. Please mix 1:1) 1 each 3   triamcinolone cream (KENALOG) 0.1 % APPLY TOPICALLY TWICE DAILY AS NEEDED. 60 g 0   No current facility-administered medications on file prior to visit.    Allergies  Allergen Reactions   Lisinopril Swelling    REACTION: Swolen lips (only)   Hctz [Hydrochlorothiazide] Other (See Comments)    Joint pain    Naproxen Itching and Palpitations   Norvasc [Amlodipine Besylate] Other (See Comments)    Peripheral edema     Social History   Socioeconomic History   Marital status: Single    Spouse name: Not on file   Number of children: Not on file   Years of education: Not on file   Highest education level: Not on file  Occupational History   Not on file  Tobacco Use   Smoking status: Former    Types: Cigarettes    Quit date: 2009    Years since quitting: 13.9   Smokeless tobacco: Never  Substance and Sexual Activity   Alcohol use: Yes    Comment: occasionally   Drug use: No    Sexual activity: Not on file  Other Topics Concern   Not on file  Social History Narrative   Not on file   Social Determinants of Health   Financial Resource Strain: Not on file  Food Insecurity: Not on file  Transportation Needs: Not on file  Physical Activity:  Not on file  Stress: Not on file  Social Connections: Not on file  Intimate Partner Violence: Not on file    Family History  Problem Relation Age of Onset   Diabetes Mother    Diabetes Sister     Past Surgical History:  Procedure Laterality Date   APPENDECTOMY     INTRAVASCULAR PRESSURE WIRE/FFR STUDY N/A 09/07/2016   Procedure: Intravascular Pressure Wire/FFR Study;  Surgeon: Nelva Bush, MD;  Location: [facility];  Service: Cardiovascular;  Laterality: N/A;   LEFT HEART CATH AND CORONARY ANGIOGRAPHY N/A 09/07/2016   Procedure: Left Heart Cath and Coronary Angiography;  Surgeon: Nelva Bush, MD;  Location: [facility];  Service: Cardiovascular;  Laterality: N/A;   TUBAL LIGATION      ROS: Review of Systems Negative except as stated above  PHYSICAL EXAM: BP 122/79    Pulse 67    Ht 5' 10"  (1.778 m)    Wt 232 lb 6.4 oz (105.4 kg)    SpO2 99%    BMI 33.35 kg/m   Wt Readings from Last 3 Encounters:  06/03/21 232 lb 6.4 oz (105.4 kg)  04/08/21 232 lb 9.6 oz (105.5 kg)  10/07/20 236 lb 12.8 oz (107.4 kg)    Physical Exam  General appearance - alert, well appearing, elderly African-American female and in no distress Mental status - normal mood, behavior, speech, dress, motor activity, and thought processes Neck - supple, no significant adenopathy Chest - clear to auscultation, no wheezes, rales or rhonchi, symmetric air entry Heart - normal rate, regular rhythm, normal S1, S2, no murmurs, rubs, clicks or gallops Extremities - peripheral pulses normal, no pedal edema, no clubbing or cyanosis Diabetic Foot Exam - Simple   Simple Foot Form Visual Inspection See comments: Yes Sensation  Testing Intact to touch and monofilament testing bilaterally: Yes Pulse Check Posterior Tibialis and Dorsalis pulse intact bilaterally: Yes Comments Varicose veins around ankles RT>LT, small corn on 5th toe BL. No ulcers.      CMP Latest Ref Rng & Units 10/07/2020 10/09/2019 10/29/2017  Glucose 65 - 99 mg/dL 121(H) 153(H) 137(H)  BUN 8 - 27 mg/dL 14 11 16   Creatinine 0.57 - 1.00 mg/dL 1.22(H) 1.11(H) 1.25(H)  Sodium 134 - 144 mmol/L 142 142 141  Potassium 3.5 - 5.2 mmol/L 4.6 4.2 4.4  Chloride 96 - 106 mmol/L 106 108(H) 108(H)  CO2 20 - 29 mmol/L 21 18(L) 16(L)  Calcium 8.7 - 10.3 mg/dL 9.7 9.3 9.6  Total Protein 6.0 - 8.5 g/dL 7.1 7.0 7.2  Total Bilirubin 0.0 - 1.2 mg/dL 0.7 0.3 0.5  Alkaline Phos 44 - 121 IU/L 94 74 71  AST 0 - 40 IU/L 19 12 17   ALT 0 - 32 IU/L 11 10 11    Lipid Panel     Component Value Date/Time   CHOL 146 10/07/2020 0909   TRIG 100 10/07/2020 0909   HDL 45 10/07/2020 0909   CHOLHDL 3.2 10/07/2020 0909   CHOLHDL 3.5 09/05/2016 0228   VLDL 22 09/05/2016 0228   LDLCALC 82 10/07/2020 0909    CBC    Component Value Date/Time   WBC 5.5 10/07/2020 0909   WBC 5.4 09/08/2016 0252   RBC 4.10 10/07/2020 0909   RBC 3.11 (L) 09/08/2016 0252   HGB 11.7 10/07/2020 0909   HCT 36.6 10/07/2020 0909   PLT 246 10/07/2020 0909   MCV 89 10/07/2020 0909   MCH 28.5 10/07/2020 0909   MCH 28.3 09/08/2016 0252  MCHC 32.0 10/07/2020 0909   MCHC 31.9 09/08/2016 0252   RDW 13.8 10/07/2020 0909   LYMPHSABS 2.6 04/22/2009 2257   MONOABS 0.6 04/22/2009 2257   EOSABS 0.1 04/22/2009 2257   BASOSABS 0.0 04/22/2009 2257    ASSESSMENT AND PLAN: 1. Type 2 diabetes mellitus with obesity (HCC) At goal.  Continue metformin 1000 mg daily.  Continue healthy eating habits and regular exercise. - POCT glucose (manual entry) - POCT glycosylated hemoglobin (Hb A1C)  2. Hypertension associated with diabetes ([place]) At goal.  Continue current medications and low-salt diet.  3. Stage  3a chronic kidney disease ([place]) We will continue to monitor kidney function.  She knows that Indocin can adversely affect the kidney function but she uses it sparingly for 1 day whenever she feels a gout attack coming on.  Reports that this is the only thing that works and works well for her gout.  If GFR falls below 45, we will need to adjust the dose of metformin - Basic Metabolic Panel  4. Coronary artery disease involving native coronary artery of native heart without angina pectoris Stable.  Continue aspirin, carvedilol and atorvastatin  5. History of gout   6. Need for shingles vaccine -Patient did not want to receive the Shingrix vaccine today but was agreeable to being given the prescription to take it to an outside pharmacy to be given at a later date. - Zoster Vaccine Adjuvanted [facility]) injection; Inject 0.5 mLs into the muscle once for 1 dose.  Dispense: 0.5 mL; Refill: 0     Patient was given the opportunity to ask questions.  Patient verbalized understanding of the plan and was able to repeat key elements of the plan.   Orders Placed This Encounter  Procedures   Basic Metabolic Panel   POCT glucose (manual entry)   POCT glycosylated hemoglobin (Hb A1C)     Requested Prescriptions   Signed Prescriptions Disp Refills   Zoster Vaccine Adjuvanted (SHINGRIX) injection 0.5 mL 0    Sig: Inject 0.5 mLs into the muscle once for 1 dose.    Return in about 4 months (around 10/02/2021).  Karle Plumber, MD, FACP

## 2021-06-04 LAB — BASIC METABOLIC PANEL
BUN/Creatinine Ratio: 14 (ref 12–28)
BUN: 16 mg/dL (ref 8–27)
CO2: 21 mmol/L (ref 20–29)
Calcium: 9.2 mg/dL (ref 8.7–10.3)
Chloride: 109 mmol/L — ABNORMAL HIGH (ref 96–106)
Creatinine, Ser: 1.11 mg/dL — ABNORMAL HIGH (ref 0.57–1.00)
Glucose: 95 mg/dL (ref 70–99)
Potassium: 4.2 mmol/L (ref 3.5–5.2)
Sodium: 143 mmol/L (ref 134–144)
eGFR: 53 mL/min/{1.73_m2} — ABNORMAL LOW (ref >59)

## 2021-06-19 ENCOUNTER — Other Ambulatory Visit: Payer: Self-pay | Admitting: Internal Medicine

## 2021-06-19 DIAGNOSIS — Z8739 Personal history of other diseases of the musculoskeletal system and connective tissue: Secondary | ICD-10-CM

## 2021-06-19 NOTE — Telephone Encounter (Signed)
Requested Prescriptions  Pending Prescriptions Disp Refills   indomethacin (INDOCIN) 50 MG capsule [Pharmacy Med Name: INDOMETHACIN 50 MG Capsule] 90 capsule 0    Sig: TAKE 1 CAPSULE TWICE DAILY AS NEEDED FOR ACUTE GOUT ATTACKS     Analgesics:  NSAIDS Failed - 06/19/2021  7:47 AM      Failed - Cr in normal range and within 360 days    Creat  Date Value Ref Range Status  08/03/2016 1.19 (H) 0.50 - 0.99 mg/dL Final    Comment:      For patients > or = 73 years of age: The upper reference limit for Creatinine is approximately 13% higher for people identified as African-American.      Creatinine, Ser  Date Value Ref Range Status  06/03/2021 1.11 (H) 0.57 - 1.00 mg/dL Final   Creatinine, Urine  Date Value Ref Range Status  07/09/2015 120 20 - 320 mg/dL Final         Passed - HGB in normal range and within 360 days    Hemoglobin  Date Value Ref Range Status  10/07/2020 11.7 11.1 - 15.9 g/dL Final         Passed - Patient is not pregnant      Passed - Valid encounter within last 12 months    Recent Outpatient Visits          2 weeks ago Type 2 diabetes mellitus with obesity ([place])   [place] Ladell Pier, MD   2 months ago Need for immunization against influenza   [place], MD   8 months ago Type 2 diabetes mellitus with obesity [facility])   [place], Deborah B, MD   1 year ago Type 2 diabetes mellitus with obesity [facility])   [place], MD   1 year ago Type 2 diabetes mellitus with obesity [facility])   [place], MD      Future Appointments            In 3 months Wynetta Emery, Dalbert Batman, MD [place]

## 2021-07-04 ENCOUNTER — Ambulatory Visit: Payer: Medicare HMO | Admitting: Internal Medicine

## 2021-08-08 ENCOUNTER — Other Ambulatory Visit: Payer: Self-pay | Admitting: Internal Medicine

## 2021-08-08 DIAGNOSIS — E1169 Type 2 diabetes mellitus with other specified complication: Secondary | ICD-10-CM

## 2021-08-08 DIAGNOSIS — E669 Obesity, unspecified: Secondary | ICD-10-CM

## 2021-08-08 NOTE — Telephone Encounter (Signed)
Requested Prescriptions  ?Pending Prescriptions Disp Refills  ?? [place] [Pharmacy Med Name: TRUEPLUS LANCETS 33G] 100 each 2  ?  Sig: TEST BLOOD SUGAR EVERY DAY  ?  ? Endocrinology: Diabetes - Testing Supplies Passed - 08/08/2021  5:12 AM  ?  ?  Passed - Valid encounter within last 12 months  ?  Recent Outpatient Visits   ?      ? 2 months ago Type 2 diabetes mellitus with obesity ([place])  ? [place] Karle Plumber B, MD  ? 4 months ago Need for immunization against influenza  ? [place] Ladell Pier, MD  ? 10 months ago Type 2 diabetes mellitus with obesity ([place])  ? [place] Karle Plumber B, MD  ? 1 year ago Type 2 diabetes mellitus with obesity ([place])  ? [place] Karle Plumber B, MD  ? 1 year ago Type 2 diabetes mellitus with obesity ([place])  ? [place] Ladell Pier, MD  ?  ?  ?Future Appointments   ?        ? In 1 month Ladell Pier, MD [place]  ?  ? ?  ?  ?  ?? TRUE METRIX BLOOD GLUCOSE TEST test strip [Pharmacy Med Name: TRUE METRIX SELF MONITORING BLOOD GLUCOSE STRIPS   Strip] 100 strip   ?  Sig: TEST BLOOD SUGAR EVERY DAY  ?  ? Endocrinology: Diabetes - Testing Supplies Passed - 08/08/2021  5:12 AM  ?  ?  Passed - Valid encounter within last 12 months  ?  Recent Outpatient Visits   ?      ? 2 months ago Type 2 diabetes mellitus with obesity ([place])  ? [place] Karle Plumber B, MD  ? 4 months ago Need for immunization against influenza  ? [place] Ladell Pier, MD  ? 10 months ago Type 2 diabetes mellitus with obesity ([place])  ? [place] Karle Plumber B, MD  ? 1 year ago Type 2 diabetes mellitus with obesity ([place])  ? [place] Karle Plumber  B, MD  ? 1 year ago Type 2 diabetes mellitus with obesity ([place])  ? [place] Ladell Pier, MD  ?  ?  ?Future Appointments   ?        ? In 1 month Ladell Pier, MD [place]  ?  ? ?  ?  ?  ? ?

## 2021-08-23 ENCOUNTER — Other Ambulatory Visit: Payer: Self-pay | Admitting: Internal Medicine

## 2021-08-25 NOTE — Telephone Encounter (Signed)
Requested medication (s) are due for refill today: yes ? ?Requested medication (s) are on the active medication list: yes ? ?Last refill:  05/09/21 60g with 0 RF ? ?Future visit scheduled: 10/02/21 ? ?Notes to clinic:  This medication can not be delegated, please assess.  ? ? ? ?  ? ?Requested Prescriptions  ?Pending Prescriptions Disp Refills  ? triamcinolone cream (KENALOG) 0.1 % [Pharmacy Med Name: TRIAMCINOLONE ACETONIDE 0.1 % Cream] 60 g 0  ?  Sig: APPLY TOPICALLY TWICE DAILY AS NEEDED.  ?  ? Not Delegated - Dermatology:  Corticosteroids Failed - 08/23/2021  2:22 PM  ?  ?  Failed - This refill cannot be delegated  ?  ?  Passed - Valid encounter within last 12 months  ?  Recent Outpatient Visits   ? ?      ? 2 months ago Type 2 diabetes mellitus with obesity ([place])  ? [place] Karle Plumber B, MD  ? 4 months ago Need for immunization against influenza  ? [place] Ladell Pier, MD  ? 10 months ago Type 2 diabetes mellitus with obesity ([place])  ? [place] Karle Plumber B, MD  ? 1 year ago Type 2 diabetes mellitus with obesity ([place])  ? [place] Karle Plumber B, MD  ? 1 year ago Type 2 diabetes mellitus with obesity ([place])  ? [place] Ladell Pier, MD  ? ?  ?  ?Future Appointments   ? ?        ? In 1 month Ladell Pier, MD [place]  ? ?  ? ?  ?  ?  ? ? ?

## 2021-09-17 ENCOUNTER — Other Ambulatory Visit: Payer: Self-pay | Admitting: Internal Medicine

## 2021-09-17 DIAGNOSIS — Z8739 Personal history of other diseases of the musculoskeletal system and connective tissue: Secondary | ICD-10-CM

## 2021-10-02 ENCOUNTER — Encounter: Payer: Self-pay | Admitting: Internal Medicine

## 2021-10-02 ENCOUNTER — Ambulatory Visit: Payer: Medicare HMO | Attending: Internal Medicine | Admitting: Internal Medicine

## 2021-10-02 VITALS — BP 112/71 | HR 96 | Temp 97.9°F | Resp 16 | Ht 70.5 in | Wt 230.0 lb

## 2021-10-02 DIAGNOSIS — I2510 Atherosclerotic heart disease of native coronary artery without angina pectoris: Secondary | ICD-10-CM

## 2021-10-02 DIAGNOSIS — I152 Hypertension secondary to endocrine disorders: Secondary | ICD-10-CM

## 2021-10-02 DIAGNOSIS — E669 Obesity, unspecified: Secondary | ICD-10-CM

## 2021-10-02 DIAGNOSIS — E1159 Type 2 diabetes mellitus with other circulatory complications: Secondary | ICD-10-CM

## 2021-10-02 DIAGNOSIS — E1169 Type 2 diabetes mellitus with other specified complication: Secondary | ICD-10-CM

## 2021-10-02 DIAGNOSIS — N1831 Chronic kidney disease, stage 3a: Secondary | ICD-10-CM

## 2021-10-02 DIAGNOSIS — Z23 Encounter for immunization: Secondary | ICD-10-CM

## 2021-10-02 LAB — POCT GLYCOSYLATED HEMOGLOBIN (HGB A1C): HbA1c, POC (controlled diabetic range): 6.4 % (ref 0.0–7.0)

## 2021-10-02 LAB — GLUCOSE, POCT (MANUAL RESULT ENTRY): POC Glucose: 123 mg/dl — AB (ref 70–99)

## 2021-10-02 MED ORDER — HYDRALAZINE HCL 25 MG PO TABS: 37.5000 mg | ORAL_TABLET | Freq: Three times a day (TID) | ORAL | 4 refills | Status: DC

## 2021-10-02 MED ORDER — ATORVASTATIN CALCIUM 10 MG PO TABS: 10.0000 mg | ORAL_TABLET | Freq: Every day | ORAL | 4 refills | Status: DC

## 2021-10-02 MED ORDER — METFORMIN HCL ER 500 MG PO TB24: 1000.0000 mg | ORAL_TABLET | Freq: Every day | ORAL | 4 refills | Status: DC

## 2021-10-02 MED ORDER — ISOSORBIDE DINITRATE 20 MG PO TABS: 20.0000 mg | ORAL_TABLET | Freq: Three times a day (TID) | ORAL | 4 refills | Status: DC

## 2021-10-02 MED ORDER — CARVEDILOL 25 MG PO TABS: ORAL_TABLET | ORAL | 4 refills | Status: DC

## 2021-10-02 NOTE — Patient Instructions (Signed)
Continuehealthy eating habits and regular exercise. ?

## 2021-10-02 NOTE — Progress Notes (Signed)
? ? ?Patient ID: Desiree Sanchez, female    DOB: Oct 14, 1950  MRN: [id number] ? ?CC: Chronic disease management ? ?Subjective: ?Chairty Sanchez is a 71 y.o. female who presents for chronic disease management ?Her concerns today include:  ?Pt with hx of HTN, HL, DM, CKD stage 3, insomnia non-obstructing CAD (cath 08/2016, for med management), RT CTS, OA knees, gout, IDA. ? ?Some allergies in past few wks.  Good results with Claritin and Albuterol.  Taking as needed. ? ?DM/Obesity: ?Results for orders placed or performed in visit on 10/02/21  ?Glucose (CBG)  ?Result Value Ref Range  ? POC Glucose 123 (A) 70 - 99 mg/dl  ?HgB A1c  ?Result Value Ref Range  ? Hemoglobin A1C    ? HbA1c POC (<> result, manual entry)    ? HbA1c, POC (prediabetic range)    ? HbA1c, POC (controlled diabetic range) 6.4 0.0 - 7.0 %  ? ?-checks BS 2-3 times a wk in a.m.  Range 112-130.  No lows.  Taking Metformin XR 1 gram daily, tolerating okay ?-eating habits good: plenty veggies, fruits, fish, bake foods instead of fried ?Exercise: _0  Yes  -[employer] 3x/wk to swim ?No numbness in feet. ? ?HYPERTENSION/CAD ?Currently taking: see medication list.  She is on carvedilol, isosorbide, atorvastatin, aspirin, hydralazine ?Med Adherence: _1  Yes    _2  No ?Medication side effects: _3  Yes    _4  No ?Adherence with salt restriction: _5  Yes    _6  No ?Home Monitoring?: _7  Yes    _8  No ?Monitoring Frequency: 1-2x/mth ?Home BP results range: 120-130/80 ?SOB? _9  Yes    _10  No ?Chest Pain?: _11  Yes    _12  No ?Leg swelling?: _13  Yes    _14  No ?Headaches?: _15  Yes    _16  No ?Dizziness? _17  Yes    _18  No ?Comments:  ? ?CKD 3: GFR has ranged from 47-53.  She continues to use Indocin scarcely when she has a gout flare. ?No flare gout since last visit.  ? ?HM:  Still has rxn for Shingrix.  Did not get shot as yet.  Plans to get done.  Due for [place] 04/2022 ?Patient Active Problem List  ? Diagnosis Date Noted  ? Hypertension associated with stage 3a chronic kidney disease due to  type 2 diabetes mellitus ([place]) 10/07/2020  ? Hyperlipidemia associated with type 2 diabetes mellitus ([place]) 10/07/2020  ? Arthritis of finger of left hand 08/05/2018  ? Primary osteoarthritis of both knees 10/29/2017  ? CAD (coronary artery disease) 02/08/2017  ? Insomnia 11/06/2016  ? Iron deficiency anemia 09/14/2016  ? Shoulder pain, left 02/11/2016  ? Right carpal tunnel syndrome 07/29/2014  ? Vitamin D insufficiency 04/13/2014  ? Obesity (BMI 30-39.9) 03/13/2013  ? Gout 05/20/2009  ? HEART MURMUR, SYSTOLIC 78/46/9629  ? DM2 (diabetes mellitus, type 2) ([place]) 04/22/2009  ? Essential hypertension 04/22/2009  ?  ? ?Current Outpatient Medications on File Prior to Visit  ?Medication Sig Dispense Refill  ? albuterol (VENTOLIN HFA) 108 (90 Base) MCG/ACT inhaler Inhale 2 puffs into the lungs every 6 (six) hours as needed for wheezing or shortness of breath. 3 each 2  ? aspirin EC 81 MG tablet Take 1 tablet (81 mg total) by mouth daily. 90 tablet 3  ? Blood Glucose Monitoring Suppl (ONETOUCH VERIO) w/Device KIT Use to check blood sugar once daily. 1 kit 0  ? Blood Pressure KIT 1 each by Does not apply route daily. 1 each 0  ? colchicine 0.6 MG tablet Take 1 tablet (  0.6 mg total) by mouth 2 (two) times daily as needed (gout flare). 180 tablet 0  ? diclofenac Sodium (VOLTAREN) 1 % GEL APPLY 2 GRAMS TOPICALLY FOUR TIMES DAILY 700 g 0  ? gabapentin (NEURONTIN) 100 MG capsule TAKE 3 CAPSULES AT BEDTIME 270 capsule 3  ? indomethacin (INDOCIN) 50 MG capsule TAKE 1 CAPSULE TWICE DAILY AS NEEDED FOR ACUTE GOUT ATTACKS 90 capsule 0  ? loratadine (CLARITIN) 10 MG tablet Take 1 tablet (10 mg total) by mouth daily as needed for allergies. 90 tablet 2  ? Triamcinolone Acetonide (TRIAMCINOLONE 0.1 % CREAM : EUCERIN) CREA Apply 1 application topically 2 (two) times daily as needed. Please mix 1:1 (Patient taking differently: Apply 1 application. topically 2 (two) times daily as needed. Please mix 1:1) 1 each 3  ? triamcinolone cream  (KENALOG) 0.1 % APPLY TOPICALLY TWICE DAILY AS NEEDED. 60 g 0  ? TRUE METRIX BLOOD GLUCOSE TEST test strip TEST BLOOD SUGAR EVERY DAY 100 strip 1  ? TRUEplus Lancets 33G MISC TEST BLOOD SUGAR EVERY DAY 100 each 2  ? ?No current facility-administered medications on file prior to visit.  ? ? ?Allergies  ?Allergen Reactions  ? Lisinopril Swelling  ?  REACTION: Swolen lips (only)  ? Hctz [Hydrochlorothiazide] Other (See Comments)  ?  Joint pain   ? Naproxen Itching and Palpitations  ? Norvasc [Amlodipine Besylate] Other (See Comments)  ?  Peripheral edema   ? ? ?Social History  ? ?Socioeconomic History  ? Marital status: Single  ?  Spouse name: Not on file  ? Number of children: Not on file  ? Years of education: Not on file  ? Highest education level: Not on file  ?Occupational History  ? Not on file  ?Tobacco Use  ? Smoking status: Former  ?  Types: Cigarettes  ?  Quit date: 2009  ?  Years since quitting: 14.3  ? Smokeless tobacco: Never  ?Substance and Sexual Activity  ? Alcohol use: Yes  ?  Comment: occasionally  ? Drug use: No  ? Sexual activity: Not on file  ?Other Topics Concern  ? Not on file  ?Social History Narrative  ? Not on file  ? ?Social Determinants of Health  ? ?Financial Resource Strain: Not on file  ?Food Insecurity: Not on file  ?Transportation Needs: Not on file  ?Physical Activity: Not on file  ?Stress: Not on file  ?Social Connections: Not on file  ?Intimate Partner Violence: Not on file  ? ? ?Family History  ?Problem Relation Age of Onset  ? Diabetes Mother   ? Diabetes Sister   ? ? ?Past Surgical History:  ?Procedure Laterality Date  ? APPENDECTOMY    ? INTRAVASCULAR PRESSURE WIRE/FFR STUDY N/A 09/07/2016  ? Procedure: Intravascular Pressure Wire/FFR Study;  Surgeon: Nelva Bush, MD;  Location: [facility];  Service: Cardiovascular;  Laterality: N/A;  ? LEFT HEART CATH AND CORONARY ANGIOGRAPHY N/A 09/07/2016  ? Procedure: Left Heart Cath and Coronary Angiography;  Surgeon: Nelva Bush, MD;  Location: [facility];  Service: Cardiovascular;  Laterality: N/A;  ? TUBAL LIGATION    ? ? ?ROS: ?Review of Systems ?Negative except as stated above ? ?PHYSICAL EXAM: ?BP 112/71 (BP Location: Left Arm, Patient Position: Sitting, Cuff Size: Large)   Pulse 96   Temp 97.9 ?F (36.6 ?C) (Oral)   Resp 16   Ht 5' 10.5" (1.791 m)   Wt 230 lb (104.3 kg)   SpO2 99%  BMI 32.54 kg/m?   ?Wt Readings from Last 3 Encounters:  ?10/02/21 230 lb (104.3 kg)  ?06/03/21 232 lb 6.4 oz (105.4 kg)  ?04/08/21 232 lb 9.6 oz (105.5 kg)  ? ? ?Physical Exam ? ?General appearance - alert, well appearing, and in no distress ?Mental status - normal mood, behavior, speech, dress, motor activity, and thought processes ?Mouth - mucous membranes moist, pharynx normal without lesions ?Neck - supple, no significant adenopathy ?Chest - clear to auscultation, no wheezes, rales or rhonchi, symmetric air entry ?Heart - normal rate, regular rhythm, normal S1, S2, no murmurs, rubs, clicks or gallops ?Extremities - peripheral pulses normal, no pedal edema, no clubbing or cyanosis ?Diabetic Foot Exam - Simple   ?Simple Foot Form ?Diabetic Foot exam was performed with the following findings: Yes 10/02/2021  9:20 AM  ?Visual Inspection ?See comments: Yes ?Sensation Testing ?Intact to touch and monofilament testing bilaterally: Yes ?Pulse Check ?Posterior Tibialis and Dorsalis pulse intact bilaterally: Yes ?Comments ?Mild bunions BL big toes ?  ? ? ? ? ?  Latest Ref Rng & Units 06/03/2021  ? 10:53 AM 10/07/2020  ?  9:09 AM 10/09/2019  ?  8:52 AM  ?CMP  ?Glucose 70 - 99 mg/dL 95   121   153    ?BUN 8 - 27 mg/dL _0 ?Creatinine 0.57 - 1.00 mg/dL 1.11   1.22   1.11    ?Sodium 134 - 144 mmol/L 143   142   142    ?Potassium 3.5 - 5.2 mmol/L 4.2   4.6   4.2    ?Chloride 96 - 106 mmol/L 109   106   108    ?CO2 20 - 29 mmol/L _1 ?Calcium 8.7 - 10.3 mg/dL 9.2   9.7   9.3    ?Total Protein 6.0 - 8.5 g/dL  7.1   7.0    ?Total  Bilirubin 0.0 - 1.2 mg/dL  0.7   0.3    ?Alkaline Phos 44 - 121 IU/L  94   74    ?AST 0 - 40 IU/L  19   12    ?ALT 0 - 32 IU/L  11   10    ? ?Lipid Panel  ?   ?Component Value Date/Time  ? CHOL 146 10/07/2020 0909  ? T

## 2021-10-03 LAB — COMPREHENSIVE METABOLIC PANEL
ALT: 12 IU/L (ref 0–32)
AST: 18 IU/L (ref 0–40)
Albumin/Globulin Ratio: 1.3 (ref 1.2–2.2)
Albumin: 4 g/dL (ref 3.7–4.7)
Alkaline Phosphatase: 85 IU/L (ref 44–121)
BUN/Creatinine Ratio: 12 (ref 12–28)
BUN: 14 mg/dL (ref 8–27)
Bilirubin Total: 0.4 mg/dL (ref 0.0–1.2)
CO2: 20 mmol/L (ref 20–29)
Calcium: 9.5 mg/dL (ref 8.7–10.3)
Chloride: 106 mmol/L (ref 96–106)
Creatinine, Ser: 1.14 mg/dL — ABNORMAL HIGH (ref 0.57–1.00)
Globulin, Total: 3.1 g/dL (ref 1.5–4.5)
Glucose: 107 mg/dL — ABNORMAL HIGH (ref 70–99)
Potassium: 4.2 mmol/L (ref 3.5–5.2)
Sodium: 140 mmol/L (ref 134–144)
Total Protein: 7.1 g/dL (ref 6.0–8.5)
eGFR: 51 mL/min/{1.73_m2} — ABNORMAL LOW (ref >59)

## 2021-10-03 LAB — CBC
Hematocrit: 35.9 % (ref 34.0–46.6)
Hemoglobin: 11.9 g/dL (ref 11.1–15.9)
MCH: 29.2 pg (ref 26.6–33.0)
MCHC: 33.1 g/dL (ref 31.5–35.7)
MCV: 88 fL (ref 79–97)
Platelets: 270 10*3/uL (ref 150–450)
RBC: 4.07 x10E6/uL (ref 3.77–5.28)
RDW: 13.6 % (ref 11.7–15.4)
WBC: 7.5 10*3/uL (ref 3.4–10.8)

## 2021-10-03 LAB — LIPID PANEL
Chol/HDL Ratio: 2.9 ratio (ref 0.0–4.4)
Cholesterol, Total: 152 mg/dL (ref 100–199)
HDL: 52 mg/dL (ref >39)
LDL Chol Calc (NIH): 78 mg/dL (ref 0–99)
Triglycerides: 124 mg/dL (ref 0–149)
VLDL Cholesterol Cal: 22 mg/dL (ref 5–40)

## 2021-10-03 LAB — MICROALBUMIN / CREATININE URINE RATIO
Creatinine, Urine: 296.9 mg/dL
Microalb/Creat Ratio: 12 mg/g creat (ref 0–29)
Microalbumin, Urine: 35.6 ug/mL

## 2021-10-22 ENCOUNTER — Emergency Department (HOSPITAL_COMMUNITY)
Admission: EM | Admit: 2021-10-22 | Discharge: 2021-10-22 | Disposition: A | Discharge status: Home / Self Care | Payer: Medicare HMO | Attending: Emergency Medicine | Admitting: Emergency Medicine

## 2021-10-22 ENCOUNTER — Emergency Department (HOSPITAL_COMMUNITY): Payer: Medicare HMO

## 2021-10-22 ENCOUNTER — Other Ambulatory Visit: Payer: Self-pay

## 2021-10-22 ENCOUNTER — Encounter (HOSPITAL_COMMUNITY): Payer: Self-pay

## 2021-10-22 DIAGNOSIS — I10 Essential (primary) hypertension: Secondary | ICD-10-CM | POA: Insufficient documentation

## 2021-10-22 DIAGNOSIS — R112 Nausea with vomiting, unspecified: Secondary | ICD-10-CM | POA: Diagnosis not present

## 2021-10-22 DIAGNOSIS — R531 Weakness: Secondary | ICD-10-CM | POA: Insufficient documentation

## 2021-10-22 DIAGNOSIS — R4182 Altered mental status, unspecified: Secondary | ICD-10-CM | POA: Diagnosis not present

## 2021-10-22 DIAGNOSIS — R42 Dizziness and giddiness: Secondary | ICD-10-CM | POA: Diagnosis not present

## 2021-10-22 DIAGNOSIS — R110 Nausea: Secondary | ICD-10-CM | POA: Diagnosis not present

## 2021-10-22 DIAGNOSIS — Z79899 Other long term (current) drug therapy: Secondary | ICD-10-CM | POA: Diagnosis not present

## 2021-10-22 DIAGNOSIS — Z7982 Long term (current) use of aspirin: Secondary | ICD-10-CM | POA: Diagnosis not present

## 2021-10-22 DIAGNOSIS — R930 Abnormal findings on diagnostic imaging of skull and head, not elsewhere classified: Secondary | ICD-10-CM

## 2021-10-22 DIAGNOSIS — M6281 Muscle weakness (generalized): Secondary | ICD-10-CM | POA: Diagnosis not present

## 2021-10-22 LAB — URINALYSIS, ROUTINE W REFLEX MICROSCOPIC
Bilirubin Urine: NEGATIVE
Glucose, UA: NEGATIVE mg/dL
Hgb urine dipstick: NEGATIVE
Ketones, ur: NEGATIVE mg/dL
Nitrite: NEGATIVE
Protein, ur: NEGATIVE mg/dL
Specific Gravity, Urine: 1.012 (ref 1.005–1.030)
pH: 5 (ref 5.0–8.0)

## 2021-10-22 LAB — COMPREHENSIVE METABOLIC PANEL
ALT: 13 U/L (ref 0–44)
AST: 19 U/L (ref 15–41)
Albumin: 3.5 g/dL (ref 3.5–5.0)
Alkaline Phosphatase: 66 U/L (ref 38–126)
Anion gap: 9 (ref 5–15)
BUN: 12 mg/dL (ref 8–23)
CO2: 20 mmol/L — ABNORMAL LOW (ref 22–32)
Calcium: 9.1 mg/dL (ref 8.9–10.3)
Chloride: 109 mmol/L (ref 98–111)
Creatinine, Ser: 1.02 mg/dL — ABNORMAL HIGH (ref 0.44–1.00)
GFR, Estimated: 58 mL/min — ABNORMAL LOW (ref >60)
Glucose, Bld: 130 mg/dL — ABNORMAL HIGH (ref 70–99)
Potassium: 3.8 mmol/L (ref 3.5–5.1)
Sodium: 138 mmol/L (ref 135–145)
Total Bilirubin: 1.1 mg/dL (ref 0.3–1.2)
Total Protein: 7.2 g/dL (ref 6.5–8.1)

## 2021-10-22 LAB — TROPONIN I (HIGH SENSITIVITY): Troponin I (High Sensitivity): 5 ng/L (ref <18)

## 2021-10-22 LAB — CBC
HCT: 38.3 % (ref 36.0–46.0)
Hemoglobin: 12.1 g/dL (ref 12.0–15.0)
MCH: 29.5 pg (ref 26.0–34.0)
MCHC: 31.6 g/dL (ref 30.0–36.0)
MCV: 93.4 fL (ref 80.0–100.0)
Platelets: 265 10*3/uL (ref 150–400)
RBC: 4.1 MIL/uL (ref 3.87–5.11)
RDW: 14.5 % (ref 11.5–15.5)
WBC: 6.7 10*3/uL (ref 4.0–10.5)
nRBC: 0 % (ref 0.0–0.2)

## 2021-10-22 LAB — LIPASE, BLOOD: Lipase: 25 U/L (ref 11–51)

## 2021-10-22 LAB — CBG MONITORING, ED: Glucose-Capillary: 113 mg/dL — ABNORMAL HIGH (ref 70–99)

## 2021-10-22 MED ORDER — LORAZEPAM 2 MG/ML IJ SOLN
0.5000 mg | Freq: Once | INTRAMUSCULAR | Status: AC
  Administered 2021-10-22: 0.5 mg via INTRAVENOUS
  Filled 2021-10-22: qty 1

## 2021-10-22 MED ORDER — MECLIZINE HCL 25 MG PO TABS
12.5000 mg | ORAL_TABLET | Freq: Once | ORAL | Status: AC
  Administered 2021-10-22: 12.5 mg via ORAL
  Filled 2021-10-22: qty 1

## 2021-10-22 MED ORDER — ONDANSETRON HCL 4 MG/2ML IJ SOLN
4.0000 mg | Freq: Once | INTRAMUSCULAR | Status: AC
  Administered 2021-10-22: 4 mg via INTRAVENOUS
  Filled 2021-10-22: qty 2

## 2021-10-22 NOTE — ED Notes (Signed)
Patient walked out of ER stated "my sister is leaving and she is my ride" Left AMA before final discharge ?

## 2021-10-22 NOTE — ED Notes (Signed)
Patient transported to MRI 

## 2021-10-22 NOTE — ED Notes (Signed)
Pt stating she wants to leave. While this RN was with another pt, pt eloped. Pt was AAOx4. Pt ambulatory. Pt appeared to be in no distress.  ?

## 2021-10-22 NOTE — ED Notes (Addendum)
This EMT performed a rectal temperature on this patient and observed the reading to be as noted in chart. This EMT then told the provider on call for [facility].  ?

## 2021-10-22 NOTE — ED Notes (Signed)
Patient transported to CT 

## 2021-10-22 NOTE — ED Triage Notes (Addendum)
Pt arrived via [facility] from home for N/V and generalized weakness for an hour prior to arrival. Pt is laying on stretcher and does not want to move much. Pt vomited for EMS. They gave zofran '4mg'$  IV. ?

## 2021-10-22 NOTE — Discharge Instructions (Addendum)
It was our pleasure to provide your ER care today - we hope that you feel better. ? ?Drink plenty of fluids/stay well hydrated. ? ?Take zofran as need for nausea. ? ?Follow up with primary care doctor in the coming week.  ? ?Your ct scan was read as showing 'near complete opacification of the right maxillary sinus' - discuss finding with your doctor at follow up, and discuss possible follow up imaging or evaluation. ? ?Return to ER if worse, new symptoms, new/severe pain, severe abdominal pain, persistent vomiting, high fevers, chest pain, trouble breathing, or other concern.  ?

## 2021-10-22 NOTE — ED Notes (Signed)
Placed pt on bair hugger 

## 2021-10-22 NOTE — ED Notes (Signed)
Pt ambulating with steady gait and no assistance. Pt able to drink fluids, denies nausea and vomiting. ?

## 2021-10-22 NOTE — ED Provider Notes (Addendum)
[facility] EMERGENCY DEPARTMENT Provider Note   CSN: [id number] Arrival date & time: 10/22/21  1452     History  Chief Complaint  Patient presents with   Emesis    Desiree Sanchez is a 71 y.o. female.  Patient c/o generalized weakness acute onset this afternoon. Pt limited historian, not answering many questions - level 5 caveat. Family member indicates was with patient today, seemed normal, at baseline, and then a few hours ago indicates did not feel well, and went to lie down.  Pt c/o generally feeling weak and nauseated. Had episode emesis in ED, not bloody or bilious. Denies focal or unilateral weakness. Denies change in speech or vision. Has not tried to walk, states is weak everywhere. Also notes dizziness/room spinning sensation. Denies hearing loss or vertigo. Denies uri symptoms or congestion. No headache. No neck pain or stiffness. Denies chest pain or discomfort. No sob. No abd pain. No dysuria or gu c/o. No fever or chills.   The history is provided by the patient, medical records, a relative and the EMS personnel.  Emesis Associated symptoms: no abdominal pain, no chills, no cough, no fever, no headaches and no sore throat       Home Medications Prior to Admission medications   Medication Sig Start Date End Date Taking? Authorizing Provider  albuterol (VENTOLIN HFA) 108 (90 Base) MCG/ACT inhaler Inhale 2 puffs into the lungs every 6 (six) hours as needed for wheezing or shortness of breath. 04/28/21   Marcine Matar, MD  aspirin EC 81 MG tablet Take 1 tablet (81 mg total) by mouth daily. 09/25/16   Azalee Course, PA  atorvastatin (LIPITOR) 10 MG tablet Take 1 tablet (10 mg total) by mouth daily. 10/02/21   Marcine Matar, MD  Blood Glucose Monitoring Suppl (ONETOUCH VERIO) w/Device KIT Use to check blood sugar once daily. 11/08/20   Marcine Matar, MD  Blood Pressure KIT 1 each by Does not apply route daily. 09/14/16   Funches, Gerilyn Nestle, MD   carvedilol (COREG) 25 MG tablet TAKE 1 TABLET BY MOUTH 2 TIMES DAILY WITH MEALS 10/02/21   Marcine Matar, MD  colchicine 0.6 MG tablet Take 1 tablet (0.6 mg total) by mouth 2 (two) times daily as needed (gout flare). 07/10/15   Funches, Gerilyn Nestle, MD  diclofenac Sodium (VOLTAREN) 1 % GEL APPLY 2 GRAMS TOPICALLY FOUR TIMES DAILY 11/30/20   Marcine Matar, MD  gabapentin (NEURONTIN) 100 MG capsule TAKE 3 CAPSULES AT BEDTIME 11/05/20   Marcine Matar, MD  hydrALAZINE (APRESOLINE) 25 MG tablet Take 1.5 tablets (37.5 mg total) by mouth 3 (three) times daily. 10/02/21   Marcine Matar, MD  indomethacin (INDOCIN) 50 MG capsule TAKE 1 CAPSULE TWICE DAILY AS NEEDED FOR ACUTE GOUT ATTACKS 09/17/21   Marcine Matar, MD  isosorbide dinitrate (ISORDIL) 20 MG tablet Take 1 tablet (20 mg total) by mouth 3 (three) times daily. 10/02/21   Marcine Matar, MD  loratadine (CLARITIN) 10 MG tablet Take 1 tablet (10 mg total) by mouth daily as needed for allergies. 05/19/19   Marcine Matar, MD  metFORMIN (GLUCOPHAGE-XR) 500 MG 24 hr tablet Take 2 tablets (1,000 mg total) by mouth daily with breakfast. 10/02/21   Marcine Matar, MD  Triamcinolone Acetonide (TRIAMCINOLONE 0.1 % CREAM : EUCERIN) CREA Apply 1 application topically 2 (two) times daily as needed. Please mix 1:1 Patient taking differently: Apply 1 application. topically 2 (two) times daily as needed.  Please mix 1:1 07/02/16   Funches, Gerilyn Nestle, MD  triamcinolone cream (KENALOG) 0.1 % APPLY TOPICALLY TWICE DAILY AS NEEDED. 08/26/21   Marcine Matar, MD  TRUE METRIX BLOOD GLUCOSE TEST test strip TEST BLOOD SUGAR EVERY DAY 08/08/21   Marcine Matar, MD  TRUEplus Lancets 33G MISC TEST BLOOD SUGAR EVERY DAY 08/08/21   Marcine Matar, MD      Allergies    Lisinopril, Hctz [hydrochlorothiazide], Naproxen, and Norvasc [amlodipine besylate]    Review of Systems   Review of Systems  Constitutional:  Negative for chills and fever.  HENT:   Negative for hearing loss, sore throat, tinnitus and trouble swallowing.   Eyes:  Negative for redness and visual disturbance.  Respiratory:  Negative for cough and shortness of breath.   Cardiovascular:  Negative for chest pain, palpitations and leg swelling.  Gastrointestinal:  Positive for vomiting. Negative for abdominal pain.  Genitourinary:  Negative for dysuria and flank pain.  Musculoskeletal:  Negative for back pain and neck pain.  Skin:  Negative for rash.  Neurological:  Negative for speech difficulty, numbness and headaches.  Hematological:  Does not bruise/bleed easily.  Psychiatric/Behavioral:  Negative for agitation.    Physical Exam Updated Vital Signs BP (!) 164/75   Pulse 71   Resp 18   Ht 1.791 m (5' 10.5")   Wt 104.3 kg   SpO2 97%   BMI 32.53 kg/m  Physical Exam Vitals and nursing note reviewed.  Constitutional:      Appearance: Normal appearance. She is well-developed.  HENT:     Head: Atraumatic.     Nose: Nose normal.     Mouth/Throat:     Mouth: Mucous membranes are moist.  Eyes:     General: No scleral icterus.    Conjunctiva/sclera: Conjunctivae normal.     Pupils: Pupils are equal, round, and reactive to light.  Neck:     Vascular: No carotid bruit.     Trachea: No tracheal deviation.     Comments: No stiffness or rigidity.  Cardiovascular:     Rate and Rhythm: Normal rate and regular rhythm.     Pulses: Normal pulses.     Heart sounds: Normal heart sounds. No murmur heard.   No friction rub. No gallop.  Pulmonary:     Effort: Pulmonary effort is normal. No respiratory distress.     Breath sounds: Normal breath sounds.  Abdominal:     General: Bowel sounds are normal. There is no distension.     Palpations: Abdomen is soft.     Tenderness: There is no abdominal tenderness.  Genitourinary:    Comments: No cva tenderness.  Musculoskeletal:        General: No swelling or tenderness.     Cervical back: Normal range of motion and neck  supple. No rigidity. No muscular tenderness.  Skin:    General: Skin is warm and dry.     Findings: No rash.  Neurological:     Mental Status: She is alert.     Comments: Alert, speech normal. No dysarthria or aphasia. No gross nystagmus noted (pt poorly able to comply w exam). Motor intact bil, stre 5/5. No pronator drift. Sens grossly intact bil.   Psychiatric:        Mood and Affect: Mood normal.    ED Results / Procedures / Treatments   Labs (all labs ordered are listed, but only abnormal results are displayed) Results for orders placed or performed during the hospital  encounter of 10/22/21  CBC  Result Value Ref Range   WBC 6.7 4.0 - 10.5 K/uL   RBC 4.10 3.87 - 5.11 MIL/uL   Hemoglobin 12.1 12.0 - 15.0 g/dL   HCT 19.1 47.8 - 29.5 %   MCV 93.4 80.0 - 100.0 fL   MCH 29.5 26.0 - 34.0 pg   MCHC 31.6 30.0 - 36.0 g/dL   RDW 62.1 30.8 - 65.7 %   Platelets 265 150 - 400 K/uL   nRBC 0.0 0.0 - 0.2 %  Comprehensive metabolic panel  Result Value Ref Range   Sodium 138 135 - 145 mmol/L   Potassium 3.8 3.5 - 5.1 mmol/L   Chloride 109 98 - 111 mmol/L   CO2 20 (L) 22 - 32 mmol/L   Glucose, Bld 130 (H) 70 - 99 mg/dL   BUN 12 8 - 23 mg/dL   Creatinine, Ser 8.46 (H) 0.44 - 1.00 mg/dL   Calcium 9.1 8.9 - 96.2 mg/dL   Total Protein 7.2 6.5 - 8.1 g/dL   Albumin 3.5 3.5 - 5.0 g/dL   AST 19 15 - 41 U/L   ALT 13 0 - 44 U/L   Alkaline Phosphatase 66 38 - 126 U/L   Total Bilirubin 1.1 0.3 - 1.2 mg/dL   GFR, Estimated 58 (L) >60 mL/min   Anion gap 9 5 - 15  Urinalysis, Routine w reflex microscopic Urine, Clean Catch  Result Value Ref Range   Color, Urine YELLOW YELLOW   APPearance HAZY (A) CLEAR   Specific Gravity, Urine 1.012 1.005 - 1.030   pH 5.0 5.0 - 8.0   Glucose, UA NEGATIVE NEGATIVE mg/dL   Hgb urine dipstick NEGATIVE NEGATIVE   Bilirubin Urine NEGATIVE NEGATIVE   Ketones, ur NEGATIVE NEGATIVE mg/dL   Protein, ur NEGATIVE NEGATIVE mg/dL   Nitrite NEGATIVE NEGATIVE    Leukocytes,Ua LARGE (A) NEGATIVE   WBC, UA 0-5 0 - 5 WBC/hpf   Bacteria, UA RARE (A) NONE SEEN   Squamous Epithelial / LPF 0-5 0 - 5  Lipase, blood  Result Value Ref Range   Lipase 25 11 - 51 U/L  CBG monitoring, ED  Result Value Ref Range   Glucose-Capillary 113 (H) 70 - 99 mg/dL  Troponin I (High Sensitivity)  Result Value Ref Range   Troponin I (High Sensitivity) 5 <18 ng/L      EKG EKG Interpretation  Date/Time:  Wednesday Oct 22 2021 15:38:41 EDT Ventricular Rate:  69 PR Interval:  212 QRS Duration: 90 QT Interval:  428 QTC Calculation: 458 R Axis:   33 Text Interpretation: Sinus rhythm with 1st degree A-V block Nonspecific T wave abnormality Confirmed by Cathren Laine ([id number]) on 10/22/2021 3:59:03 PM  Radiology CT Head Wo Contrast  Result Date: 10/22/2021 CLINICAL DATA:  Provided history: Mental status change, unknown cause. Additional history provided: Nausea/vomiting, generalized weakness. EXAM: CT HEAD WITHOUT CONTRAST TECHNIQUE: Contiguous axial images were obtained from the base of the skull through the vertex without intravenous contrast. RADIATION DOSE REDUCTION: This exam was performed according to the departmental dose-optimization program which includes automated exposure control, adjustment of the mA and/or kV according to patient size and/or use of iterative reconstruction technique. COMPARISON:  No pertinent prior exams available for comparison. FINDINGS: Brain: No age-advanced or lobar predominant parenchymal atrophy. There is no acute intracranial hemorrhage. No demarcated cortical infarct. No extra-axial fluid collection. No evidence of an intracranial mass. No midline shift. Vascular: No hyperdense vessel. Atherosclerotic calcifications. Small foci of gas within  the cavernous sinus, likely related to recent intravenous access. Skull: No acute fracture or aggressive osseous lesion. Sinuses/Orbits: No mass or acute finding within the imaged orbits. Chronic  deformity of the right lamina papyracea. 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 Hyperdensity within the right maxillary sinus may reflect inspissated secretions or sequela of chronic fungal sinusitis. IMPRESSION: No evidence of acute intracranial abnormality. Severe right maxillary sinusitis, as described. Electronically Signed   By: Jackey Loge D.O.   On: 10/22/2021 16:56   MR BRAIN WO CONTRAST  Result Date: 10/22/2021 CLINICAL DATA:  Dizziness with nausea and vomiting. EXAM: MRI HEAD WITHOUT CONTRAST TECHNIQUE: Multiplanar, multiecho pulse sequences of the brain and surrounding structures were obtained without intravenous contrast. COMPARISON:  None Available. FINDINGS: Brain: No acute infarct, mass effect or extra-axial collection. No acute or chronic hemorrhage. There is multifocal hyperintense T2-weighted signal within the white matter. Generalized cerebral volume loss. The midline structures are normal. Vascular: Major flow voids are preserved. Skull and upper cervical spine: Normal calvarium and skull base. Visualized upper cervical spine and soft tissues are normal. Sinuses/Orbits:No paranasal sinus fluid levels or advanced mucosal thickening. No mastoid or middle ear effusion. Normal orbits. IMPRESSION: 1. No acute intracranial abnormality. 2. Chronic small vessel ischemia and cerebral volume loss. Electronically Signed   By: Deatra Robinson M.D.   On: 10/22/2021 19:11    Procedures Procedures    Medications Ordered in ED Medications  ondansetron (ZOFRAN) injection 4 mg (has no administration in time range)  LORazepam (ATIVAN) injection 0.5 mg (has no administration in time range)    ED Course/ Medical Decision Making/ A&P                           Medical Decision Making Problems Addressed: Dizziness: acute illness or injury with systemic symptoms that poses a threat to life or bodily functions Essential hypertension: chronic  illness or injury Nausea and vomiting in adult: acute illness or injury with systemic symptoms that poses a threat to life or bodily functions  Amount and/or Complexity of Data Reviewed Independent Historian: EMS    Details: ems/family - hx provided. External Data Reviewed: notes. Labs: ordered. Decision-making details documented in ED Course. Radiology: ordered and independent interpretation performed. Decision-making details documented in ED Course. ECG/medicine tests: ordered and independent interpretation performed. Decision-making details documented in ED Course.  Risk Prescription drug management. Decision regarding hospitalization.  Iv ns. Continuous pulse ox and cardiac monitoring. Labs ordered/sent. Imaging ordered.   Diff dx includes posterior circ cva, gastroenteritis, dehydration - disposition decision including potential need for admission considered - will get labs and imaging and reassess after treatment.   Reviewed nursing notes and prior charts for additional history. External reports reviewed. Additional history from: family and ems.   Cardiac monitor: sinus rhythm, rate 70.  Labs reviewed/interpreted by me - wbc and hgb normal.   CT reviewed/interpreted by me - no hem.   Mri reviewed/interpreted by me - no cva.  Dizziness improved w meds, nausea improved w meds. Ivf bolus.  Po fluids/trial.  Pt is tolerating po.  Pt rechecked - indicates feels much improved. Pt denies abd pain. Abd soft non tender. Pt denies any chest pain. No sob. Pt denies sinus congestion or pain, no fevers or uri symptoms. Ambulates w steady gait.   Pt awaiting UA result.   Prior to UA resulting, pt had left ED without notifying EDP.  Final Clinical Impression(s) / ED Diagnoses Final diagnoses:  None    Rx / DC Orders ED Discharge Orders     None          Cathren Laine, MD 10/22/21 2118

## 2021-10-22 NOTE — ED Notes (Signed)
Fluids given to pt.  ?

## 2021-12-07 ENCOUNTER — Other Ambulatory Visit: Payer: Self-pay | Admitting: Internal Medicine

## 2021-12-07 DIAGNOSIS — Z8739 Personal history of other diseases of the musculoskeletal system and connective tissue: Secondary | ICD-10-CM

## 2021-12-08 NOTE — Telephone Encounter (Signed)
Requested medication (s) are due for refill today: Yes  Requested medication (s) are on the active medication list: Yes  Last refill:  08/26/21  Future visit scheduled: Yes  Notes to clinic:  Unable to refill per protocol, cannot delegate.      Requested Prescriptions  Pending Prescriptions Disp Refills   triamcinolone cream (KENALOG) 0.1 % [Pharmacy Med Name: TRIAMCINOLONE ACETONIDE 0.1 % Cream] 60 g 0    Sig: APPLY TOPICALLY TWICE DAILY AS NEEDED.     Not Delegated - Dermatology:  Corticosteroids Failed - 12/07/2021  7:16 AM      Failed - This refill cannot be delegated      Passed - Valid encounter within last 12 months    Recent Outpatient Visits           2 months ago Type 2 diabetes mellitus with obesity ([place])   [place] Karle Plumber B, MD   6 months ago Type 2 diabetes mellitus with obesity [facility])   [place] Ladell Pier, MD   8 months ago Need for immunization against influenza   [place], Deborah B, MD   1 year ago Type 2 diabetes mellitus with obesity [facility])   [place] Ladell Pier, MD   1 year ago Type 2 diabetes mellitus with obesity [facility])   [place] Ladell Pier, MD       Future Appointments             In 1 month Ladell Pier, MD [place]            Signed Prescriptions Disp Refills   indomethacin (INDOCIN) 50 MG capsule 90 capsule 1    Sig: TAKE 1 CAPSULE TWICE DAILY AS NEEDED FOR ACUTE GOUT ATTACKS     Analgesics:  NSAIDS Failed - 12/07/2021  7:16 AM      Failed - Manual Review: Labs are only required if the patient has taken medication for more than 8 weeks.      Failed - Cr in normal range and within 360 days    Creat  Date Value Ref Range Status  08/03/2016 1.19 (H) 0.50 - 0.99 mg/dL Final    Comment:      For  patients > or = 73 years of age: The upper reference limit for Creatinine is approximately 13% higher for people identified as African-American.      Creatinine, Ser  Date Value Ref Range Status  10/22/2021 1.02 (H) 0.44 - 1.00 mg/dL Final   Creatinine, Urine  Date Value Ref Range Status  07/09/2015 120 20 - 320 mg/dL Final         Passed - HGB in normal range and within 360 days    Hemoglobin  Date Value Ref Range Status  10/22/2021 12.1 12.0 - 15.0 g/dL Final  10/02/2021 11.9 11.1 - 15.9 g/dL Final         Passed - PLT in normal range and within 360 days    Platelets  Date Value Ref Range Status  10/22/2021 265 150 - 400 K/uL Final  10/02/2021 270 150 - 450 x10E3/uL Final         Passed - HCT in normal range and within 360 days    HCT  Date Value Ref Range Status  10/22/2021 38.3 36.0 - 46.0 % Final   Hematocrit  Date Value Ref Range Status  10/02/2021 35.9 34.0 - 46.6 % Final         Passed - eGFR is 30 or above and within 360 days    GFR, Est African American  Date Value Ref Range Status  08/03/2016 55 (L) >=60 mL/min Final   GFR calc Af Amer  Date Value Ref Range Status  10/09/2019 58 (L) >59 mL/min/1.73 Final    Comment:    **[facility] currently reports eGFR in compliance with the current**   recommendations of the [employer]. [facility] will   update reporting as new guidelines are published from the NKF-ASN   Task force.    GFR, Est Non African American  Date Value Ref Range Status  08/03/2016 47 (L) >=60 mL/min Final   GFR, Estimated  Date Value Ref Range Status  10/22/2021 58 (L) >60 mL/min Final    Comment:    (NOTE) Calculated using the CKD-EPI Creatinine Equation (2021)    eGFR  Date Value Ref Range Status  10/02/2021 51 (L) >59 mL/min/1.73 Final         Passed - Patient is not pregnant      Passed - Valid encounter within last 12 months    Recent Outpatient Visits           2 months ago Type 2 diabetes mellitus with  obesity ([place])   [place] Karle Plumber B, MD   6 months ago Type 2 diabetes mellitus with obesity [facility])   [place], MD   8 months ago Need for immunization against influenza   [place], Deborah B, MD   1 year ago Type 2 diabetes mellitus with obesity [facility])   [place], Deborah B, MD   1 year ago Type 2 diabetes mellitus with obesity [facility])   [place], MD       Future Appointments             In 1 month Wynetta Emery, Dalbert Batman, MD [place]

## 2021-12-31 ENCOUNTER — Other Ambulatory Visit: Payer: Self-pay

## 2022-01-28 ENCOUNTER — Other Ambulatory Visit: Payer: Self-pay | Admitting: Internal Medicine

## 2022-01-28 DIAGNOSIS — M170 Bilateral primary osteoarthritis of knee: Secondary | ICD-10-CM

## 2022-02-02 ENCOUNTER — Encounter: Payer: Self-pay | Admitting: Internal Medicine

## 2022-02-02 ENCOUNTER — Ambulatory Visit: Payer: Medicare HMO | Attending: Internal Medicine | Admitting: Internal Medicine

## 2022-02-02 VITALS — BP 151/77 | HR 59 | Ht 70.5 in | Wt 221.0 lb

## 2022-02-02 DIAGNOSIS — I152 Hypertension secondary to endocrine disorders: Secondary | ICD-10-CM | POA: Diagnosis not present

## 2022-02-02 DIAGNOSIS — E1169 Type 2 diabetes mellitus with other specified complication: Secondary | ICD-10-CM | POA: Diagnosis not present

## 2022-02-02 DIAGNOSIS — Z23 Encounter for immunization: Secondary | ICD-10-CM | POA: Diagnosis not present

## 2022-02-02 DIAGNOSIS — E669 Obesity, unspecified: Secondary | ICD-10-CM

## 2022-02-02 DIAGNOSIS — E1159 Type 2 diabetes mellitus with other circulatory complications: Secondary | ICD-10-CM | POA: Diagnosis not present

## 2022-02-02 DIAGNOSIS — M25641 Stiffness of right hand, not elsewhere classified: Secondary | ICD-10-CM

## 2022-02-02 DIAGNOSIS — N1831 Chronic kidney disease, stage 3a: Secondary | ICD-10-CM | POA: Diagnosis not present

## 2022-02-02 DIAGNOSIS — I2510 Atherosclerotic heart disease of native coronary artery without angina pectoris: Secondary | ICD-10-CM | POA: Diagnosis not present

## 2022-02-02 DIAGNOSIS — M25642 Stiffness of left hand, not elsewhere classified: Secondary | ICD-10-CM

## 2022-02-02 LAB — POCT GLYCOSYLATED HEMOGLOBIN (HGB A1C): HbA1c, POC (controlled diabetic range): 6.3 % (ref 0.0–7.0)

## 2022-02-02 LAB — GLUCOSE, POCT (MANUAL RESULT ENTRY): POC Glucose: 171 mg/dl — AB (ref 70–99)

## 2022-02-02 MED ORDER — ZOSTER VAC RECOMB ADJUVANTED 50 MCG/0.5ML IM SUSR: 0.5000 mL | Freq: Once | INTRAMUSCULAR | 0 refills | Status: AC

## 2022-02-02 MED ORDER — HYDRALAZINE HCL 50 MG PO TABS: 50.0000 mg | ORAL_TABLET | Freq: Three times a day (TID) | ORAL | 2 refills | Status: DC

## 2022-02-02 NOTE — Progress Notes (Signed)
Patient ID: [name], female    DOB: [birth year]  MRN: [id number]  CC: Chronic disease management  Subjective: Desiree Sanchez is a 71 y.o. female who presents for chronic disease management Her concerns today include:  Pt with hx of HTN, HL, DM, CKD stage 3, insomnia non-obstructing CAD (cath 08/2016, for med management), RT CTS, OA knees, gout, IDA.  HTN/CAD: Reports compliance with carvedilol 25 mg BID, isosorbide 20 mg TID, hydralazine 37.5 TID, atorvastatin 10 mg daily and aspirin Checks BP 2x/mth.  Last reading was 130/90 Limit salt in the foods. No chest pains, shortness of breath, lower extremity edema, HA.  CKD: Last GFR was 58 done in May of this year.  She uses Indocin sparingly when she has gout attack. Has not had a gout flare in a while  DM: A1C 6.3  Results for orders placed or performed in visit on 02/02/22  POCT glucose (manual entry)  Result Value Ref Range   POC Glucose 171 (A) 70 - 99 mg/dl  POCT glycosylated hemoglobin (Hb A1C)  Result Value Ref Range   Hemoglobin A1C     HbA1c POC (<> result, manual entry)     HbA1c, POC (prediabetic range)     HbA1c, POC (controlled diabetic range) 6.3 0.0 - 7.0 %  Reports compliance with metformin XR 1 g daily.   Checks BS every other day before BF.  Range 80-130. Down 8-9 lbs since last visit.  Goes [facility] 3x/wk Doing good with eating habits.  Staying away from sugary things.  She stopped drinking sodas.  Drinks 1-2 bottle of water a day.  Uses Honey in her tea  Requested RF on Tramadol earlier this mth.  Gets stiffness in fingers when she does a lot of knitting and crocheting.  Uses the tramadol sparingly.  Has OA of knees but knees have not bothered her much lately.  HM:  Due for flu shot, agrees to receive it today.  Never got shingles vaccine.  Misplaced rxn Due for MWV in 04/2022 Patient Active Problem List   Diagnosis Date Noted   Hypertension associated with stage 3a chronic kidney disease due to  type 2 diabetes mellitus (HCC) 10/07/2020   Hyperlipidemia associated with type 2 diabetes mellitus (HCC) 10/07/2020   Arthritis of finger of left hand 08/05/2018   Primary osteoarthritis of both knees 10/29/2017   CAD (coronary artery disease) 02/08/2017   Insomnia 11/06/2016   Iron deficiency anemia 09/14/2016   Shoulder pain, left 02/11/2016   Right carpal tunnel syndrome 07/29/2014   Vitamin D insufficiency 04/13/2014   Obesity (BMI 30-39.9) 03/13/2013   Gout 05/20/2009   HEART MURMUR, SYSTOLIC 05/06/2009   DM2 (diabetes mellitus, type 2) (HCC) 04/22/2009   Essential hypertension 04/22/2009     Current Outpatient Medications on File Prior to Visit  Medication Sig Dispense Refill   albuterol (VENTOLIN HFA) 108 (90 Base) MCG/ACT inhaler Inhale 2 puffs into the lungs every 6 (six) hours as needed for wheezing or shortness of breath. 3 each 2   aspirin EC 81 MG tablet Take 1 tablet (81 mg total) by mouth daily. 90 tablet 3   atorvastatin (LIPITOR) 10 MG tablet Take 1 tablet (10 mg total) by mouth daily. 90 tablet 4   Blood Glucose Monitoring Suppl (ONETOUCH VERIO) w/Device KIT Use to check blood sugar once daily. 1 kit 0   Blood Pressure KIT 1 each by Does not apply route daily. 1 each 0   carvedilol (COREG)  25 MG tablet TAKE 1 TABLET BY MOUTH 2 TIMES DAILY WITH MEALS 180 tablet 4   colchicine 0.6 MG tablet Take 1 tablet (0.6 mg total) by mouth 2 (two) times daily as needed (gout flare). 180 tablet 0   diclofenac Sodium (VOLTAREN) 1 % GEL APPLY 2 GRAMS TOPICALLY FOUR TIMES DAILY 700 g 0   gabapentin (NEURONTIN) 100 MG capsule TAKE 3 CAPSULES AT BEDTIME 270 capsule 3   indomethacin (INDOCIN) 50 MG capsule TAKE 1 CAPSULE TWICE DAILY AS NEEDED FOR ACUTE GOUT ATTACKS 90 capsule 1   isosorbide dinitrate (ISORDIL) 20 MG tablet Take 1 tablet (20 mg total) by mouth 3 (three) times daily. 270 tablet 4   loratadine (CLARITIN) 10 MG tablet Take 1 tablet (10 mg total) by mouth daily as needed for  allergies. 90 tablet 2   metFORMIN (GLUCOPHAGE-XR) 500 MG 24 hr tablet Take 2 tablets (1,000 mg total) by mouth daily with breakfast. 180 tablet 4   traMADol (ULTRAM) 50 MG tablet TAKE 1 TABLET (50 MG TOTAL) BY MOUTH DAILY AS NEEDED FOR MODERATE PAIN. 30 tablet 0   Triamcinolone Acetonide (TRIAMCINOLONE 0.1 % CREAM : EUCERIN) CREA Apply 1 application topically 2 (two) times daily as needed. Please mix 1:1 (Patient taking differently: Apply 1 application  topically 2 (two) times daily as needed. Please mix 1:1) 1 each 3   triamcinolone cream (KENALOG) 0.1 % APPLY TOPICALLY TWICE DAILY AS NEEDED. 60 g 0   TRUE METRIX BLOOD GLUCOSE TEST test strip TEST BLOOD SUGAR EVERY DAY 100 strip 1   TRUEplus Lancets 33G MISC TEST BLOOD SUGAR EVERY DAY 100 each 2   No current facility-administered medications on file prior to visit.    Allergies  Allergen Reactions   Lisinopril Swelling    REACTION: Swolen lips (only)   Hctz [Hydrochlorothiazide] Other (See Comments)    Joint pain    Naproxen Itching and Palpitations   Norvasc [Amlodipine Besylate] Other (See Comments)    Peripheral edema     Social History   Socioeconomic History   Marital status: Single    Spouse name: Not on file   Number of children: Not on file   Years of education: Not on file   Highest education level: Not on file  Occupational History   Not on file  Tobacco Use   Smoking status: Former    Types: Cigarettes    Quit date: 2009    Years since quitting: 14.6   Smokeless tobacco: Never  Substance and Sexual Activity   Alcohol use: Yes    Comment: occasionally   Drug use: No   Sexual activity: Not on file  Other Topics Concern   Not on file  Social History Narrative   Not on file   Social Determinants of Health   Financial Resource Strain: Not on file  Food Insecurity: Not on file  Transportation Needs: Not on file  Physical Activity: Not on file  Stress: Not on file  Social Connections: Not on file   Intimate Partner Violence: Not on file    Family History  Problem Relation Age of Onset   Diabetes Mother    Diabetes Sister     Past Surgical History:  Procedure Laterality Date   APPENDECTOMY     INTRAVASCULAR PRESSURE WIRE/FFR STUDY N/A 09/07/2016   Procedure: Intravascular Pressure Wire/FFR Study;  Surgeon: Nelva Bush, MD;  Location: [place] [facility];  Service: Cardiovascular;  Laterality: N/A;   LEFT HEART CATH AND CORONARY ANGIOGRAPHY N/A  09/07/2016   Procedure: Left Heart Cath and Coronary Angiography;  Surgeon: Nelva Bush, MD;  Location: [place] [facility];  Service: Cardiovascular;  Laterality: N/A;   TUBAL LIGATION      ROS: Review of Systems Negative except as stated above  PHYSICAL EXAM: BP (!) 151/77   Pulse (!) 59   Ht 5' 10.5" (1.791 m)   Wt 221 lb (100.2 kg)   SpO2 99%   BMI 31.26 kg/m   Wt Readings from Last 3 Encounters:  02/02/22 221 lb (100.2 kg)  10/22/21 229 lb 15 oz (104.3 kg)  10/02/21 230 lb (104.3 kg)    Physical Exam  General appearance - alert, well appearing, older African-American female and in no distress Mental status - normal mood, behavior, speech, dress, motor activity, and thought processes Neck - supple, no significant adenopathy Chest - clear to auscultation, no wheezes, rales or rhonchi, symmetric air entry Heart - normal rate, regular rhythm, normal S1, S2, no murmurs, rubs, clicks or gallops Extremities - peripheral pulses normal, no pedal edema, no clubbing or cyanosis MSK: Slight enlargement of the PIP and DIP joints.     Latest Ref Rng & Units 10/22/2021    4:51 PM 10/02/2021    9:35 AM 06/03/2021   10:53 AM  CMP  Glucose 70 - 99 mg/dL 130  107  95   BUN 8 - 23 mg/dL _0 Creatinine 0.44 - 1.00 mg/dL 1.02  1.14  1.11   Sodium 135 - 145 mmol/L 138  140  143   Potassium 3.5 - 5.1 mmol/L 3.8  4.2  4.2   Chloride 98 - 111 mmol/L 109  106  109   CO2 22 - 32 mmol/L _1 Calcium 8.9 - 10.3 mg/dL  9.1  9.5  9.2   Total Protein 6.5 - 8.1 g/dL 7.2  7.1    Total Bilirubin 0.3 - 1.2 mg/dL 1.1  0.4    Alkaline Phos 38 - 126 U/L 66  85    AST 15 - 41 U/L 19  18    ALT 0 - 44 U/L 13  12     Lipid Panel     Component Value Date/Time   CHOL 152 10/02/2021 0935   TRIG 124 10/02/2021 0935   HDL 52 10/02/2021 0935   CHOLHDL 2.9 10/02/2021 0935   CHOLHDL 3.5 09/05/2016 0228   VLDL 22 09/05/2016 0228   LDLCALC 78 10/02/2021 0935    CBC    Component Value Date/Time   WBC 6.7 10/22/2021 1651   RBC 4.10 10/22/2021 1651   HGB 12.1 10/22/2021 1651   HGB 11.9 10/02/2021 0935   HCT 38.3 10/22/2021 1651   HCT 35.9 10/02/2021 0935   PLT 265 10/22/2021 1651   PLT 270 10/02/2021 0935   MCV 93.4 10/22/2021 1651   MCV 88 10/02/2021 0935   MCH 29.5 10/22/2021 1651   MCHC 31.6 10/22/2021 1651   RDW 14.5 10/22/2021 1651   RDW 13.6 10/02/2021 0935   LYMPHSABS 2.6 04/22/2009 2257   MONOABS 0.6 04/22/2009 2257   EOSABS 0.1 04/22/2009 2257   BASOSABS 0.0 04/22/2009 2257    ASSESSMENT AND PLAN:  1. Type 2 diabetes mellitus with obesity (HCC) At goal.  Continue metformin, healthy eating habits and regular exercise. - POCT glucose (manual entry) - POCT glycosylated hemoglobin (Hb A1C)  2. Hypertension associated with diabetes ([place]) Not at goal.  I recommend increasing hydralazine  to 50 mg 3 times a day.  Continue to monitor blood pressure with goal being 130/80 or lower.  Continue carvedilol, isosorbide  3. Coronary artery disease involving native coronary artery of native heart without angina pectoris Stable.  Continue carvedilol 25 mg daily, atorvastatin 10 mg daily, isosorbide 20 mg 3 times a day and aspirin 81 mg daily  4. Stage 3a chronic kidney disease (HCC) Stable.  We will continue to monitor.  5.  Stiffness of joints in both hands She will use tramadol sparingly as needed.  Advised that the medication can cause drowsiness, constipation and dependence.  6. Need for influenza  vaccination Given today.  7. Need for shingles vaccine Prescription sent to [facility] per patient's request.  She plans to get the shot clear. - Zoster Vaccine Adjuvanted [facility]) injection; Inject 0.5 mLs into the muscle once for 1 dose.  Dispense: 0.5 mL; Refill: 0    Patient was given the opportunity to ask questions.  Patient verbalized understanding of the plan and was able to repeat key elements of the plan.   This documentation was completed using Radio producer.  Any transcriptional errors are unintentional.  Orders Placed This Encounter  Procedures   Flu Vaccine QUAD 53moIM (Fluarix, Fluzone & Alfiuria Quad PF)   POCT glucose (manual entry)   POCT glycosylated hemoglobin (Hb A1C)     Requested Prescriptions   Signed Prescriptions Disp Refills   Zoster Vaccine Adjuvanted (SHINGRIX) injection 0.5 mL 0    Sig: Inject 0.5 mLs into the muscle once for 1 dose.   hydrALAZINE (APRESOLINE) 50 MG tablet 270 tablet 2    Sig: Take 1 tablet (50 mg total) by mouth 3 (three) times daily.    Return in about 4 months (around 06/04/2022) for Give Appt with LLurena Joinerafter November 1 st 2023 for Medicare Wellness.  DKarle Plumber MD, FACP

## 2022-02-02 NOTE — Patient Instructions (Signed)
Your blood pressure is not at goal.  We have increased the hydralazine to 50 mg 3 times a day.  Continue to monitor your blood pressure with goal being 130/80 or lower.  Please remember to go to [facility] to get the shingles vaccine.  Remember this is a 2 vaccine series.  He will get the first shot and then the second shot should be given 2 to 6 months later.

## 2022-02-19 ENCOUNTER — Other Ambulatory Visit: Payer: Self-pay | Admitting: Internal Medicine

## 2022-03-04 ENCOUNTER — Other Ambulatory Visit: Payer: Self-pay | Admitting: Internal Medicine

## 2022-03-04 DIAGNOSIS — E669 Obesity, unspecified: Secondary | ICD-10-CM

## 2022-03-04 NOTE — Telephone Encounter (Signed)
Requested Prescriptions  Pending Prescriptions Disp Refills  . TRUE METRIX BLOOD GLUCOSE TEST test strip [Pharmacy Med Name: TRUE METRIX SELF MONITORING BLOOD GLUCOSE STRIPS   Strip] 100 strip 1    Sig: TEST BLOOD SUGAR EVERY DAY     Endocrinology: Diabetes - Testing Supplies Passed - 03/04/2022  3:01 AM      Passed - Valid encounter within last 12 months    Recent Outpatient Visits          1 month ago Type 2 diabetes mellitus with obesity ([place])   [place] Karle Plumber B, MD   5 months ago Type 2 diabetes mellitus with obesity [facility])   [place], MD   9 months ago Type 2 diabetes mellitus with obesity [facility])   [place], MD   11 months ago Need for immunization against influenza   [place], Deborah B, MD   1 year ago Type 2 diabetes mellitus with obesity [facility])   [place], Deborah B, MD      Future Appointments            In 1 month Daisy Blossom, Jarome Matin, [place]   In 3 months Wynetta Emery, Dalbert Batman, MD [place]

## 2022-03-10 DIAGNOSIS — Z0100 Encounter for examination of eyes and vision without abnormal findings: Secondary | ICD-10-CM | POA: Diagnosis not present

## 2022-03-10 DIAGNOSIS — H52222 Regular astigmatism, left eye: Secondary | ICD-10-CM | POA: Diagnosis not present

## 2022-03-10 DIAGNOSIS — H4323 Crystalline deposits in vitreous body, bilateral: Secondary | ICD-10-CM | POA: Diagnosis not present

## 2022-03-10 DIAGNOSIS — H35033 Hypertensive retinopathy, bilateral: Secondary | ICD-10-CM | POA: Diagnosis not present

## 2022-03-10 DIAGNOSIS — H5203 Hypermetropia, bilateral: Secondary | ICD-10-CM | POA: Diagnosis not present

## 2022-03-10 DIAGNOSIS — E119 Type 2 diabetes mellitus without complications: Secondary | ICD-10-CM | POA: Diagnosis not present

## 2022-03-10 DIAGNOSIS — H2513 Age-related nuclear cataract, bilateral: Secondary | ICD-10-CM | POA: Diagnosis not present

## 2022-03-10 DIAGNOSIS — H524 Presbyopia: Secondary | ICD-10-CM | POA: Diagnosis not present

## 2022-03-10 LAB — HM DIABETES EYE EXAM

## 2022-04-09 ENCOUNTER — Ambulatory Visit: Payer: Medicare HMO | Attending: Internal Medicine | Admitting: Pharmacist

## 2022-04-09 ENCOUNTER — Encounter: Payer: Self-pay | Admitting: Pharmacist

## 2022-04-09 VITALS — BP 117/66 | HR 70 | Ht 67.52 in | Wt 226.2 lb

## 2022-04-09 DIAGNOSIS — Z0000 Encounter for general adult medical examination without abnormal findings: Secondary | ICD-10-CM | POA: Diagnosis not present

## 2022-04-09 NOTE — Progress Notes (Signed)
Subjective:   Desiree Sanchez is a 71 y.o. female who presents for Medicare Annual (Subsequent) preventive examination.   Cardiac Risk Factors include: smoking/ tobacco exposure     Objective:    Today's Vitals   04/09/22 0845  BP: 117/66  Pulse: 70  Weight: 226 lb 3.2 oz (102.6 kg)  Height: 5' 7.52" (1.715 m)   Body mass index is 34.88 kg/m.     04/09/2022    9:00 AM 10/22/2021    3:37 PM 04/08/2021    3:24 PM 01/22/2020   10:13 AM 02/16/2017    1:39 PM 11/06/2016   10:11 AM 09/14/2016   10:12 AM  Advanced Directives  Does Patient Have a Medical Advance Directive? No Yes No Yes No No No  Type of [employer] treasure[employer] of [place];Living will  [place]     Does patient want to make changes to medical advance directive?  No - Patient declined       Copy of [place] in Chart?  No - copy available, Physician notified  No - copy requested     Would patient like information on creating a medical advance directive? No - Patient declined  No - Patient declined  No - Patient declined      Current Medications (verified) Outpatient Encounter Medications as of 04/09/2022  Medication Sig   albuterol (VENTOLIN HFA) 108 (90 Base) MCG/ACT inhaler Inhale 2 puffs into the lungs every 6 (six) hours as needed for wheezing or shortness of breath.   aspirin EC 81 MG tablet Take 1 tablet (81 mg total) by mouth daily.   atorvastatin (LIPITOR) 10 MG tablet Take 1 tablet (10 mg total) by mouth daily.   carvedilol (COREG) 25 MG tablet TAKE 1 TABLET BY MOUTH 2 TIMES DAILY WITH MEALS   diclofenac Sodium (VOLTAREN) 1 % GEL APPLY 2 GRAMS TOPICALLY FOUR TIMES DAILY   gabapentin (NEURONTIN) 100 MG capsule TAKE 3 CAPSULES AT BEDTIME   hydrALAZINE (APRESOLINE) 50 MG tablet Take 1 tablet (50 mg total) by mouth 3 (three) times daily.   isosorbide dinitrate (ISORDIL) 20 MG tablet Take 1 tablet (20 mg total) by mouth 3 (three) times daily.   loratadine  (CLARITIN) 10 MG tablet Take 1 tablet (10 mg total) by mouth daily as needed for allergies.   metFORMIN (GLUCOPHAGE-XR) 500 MG 24 hr tablet Take 2 tablets (1,000 mg total) by mouth daily with breakfast.   Blood Glucose Monitoring Suppl (ONETOUCH VERIO) w/Device KIT Use to check blood sugar once daily.   Blood Pressure KIT 1 each by Does not apply route daily.   colchicine 0.6 MG tablet Take 1 tablet (0.6 mg total) by mouth 2 (two) times daily as needed (gout flare).   indomethacin (INDOCIN) 50 MG capsule TAKE 1 CAPSULE TWICE DAILY AS NEEDED FOR ACUTE GOUT ATTACKS   traMADol (ULTRAM) 50 MG tablet TAKE 1 TABLET (50 MG TOTAL) BY MOUTH DAILY AS NEEDED FOR MODERATE PAIN.   Triamcinolone Acetonide (TRIAMCINOLONE 0.1 % CREAM : EUCERIN) CREA Apply 1 application topically 2 (two) times daily as needed. Please mix 1:1 (Patient taking differently: Apply 1 application  topically 2 (two) times daily as needed. Please mix 1:1)   triamcinolone cream (KENALOG) 0.1 % APPLY TOPICALLY TWICE DAILY AS NEEDED.   TRUE METRIX BLOOD GLUCOSE TEST test strip TEST BLOOD SUGAR EVERY DAY   TRUEplus Lancets 33G MISC TEST BLOOD SUGAR EVERY DAY   No facility-administered encounter medications on file  as of 04/09/2022.    Allergies (verified) Lisinopril, Hctz [hydrochlorothiazide], Naproxen, and Norvasc [amlodipine besylate]   History: Past Medical History:  Diagnosis Date   Arthritis Dx 2006   Carpal tunnel syndrome    Diabetes mellitus without complication ([place]) Dx 7846   Dyspnea    Gout    Heart murmur    Hypertension Dx 2005   Unstable angina ([place]) 08/2016   Past Surgical History:  Procedure Laterality Date   APPENDECTOMY     INTRAVASCULAR PRESSURE WIRE/FFR STUDY N/A 09/07/2016   Procedure: Intravascular Pressure Wire/FFR Study;  Surgeon: Nelva Bush, MD;  Location: [facility];  Service: Cardiovascular;  Laterality: N/A;   LEFT HEART CATH AND CORONARY ANGIOGRAPHY N/A 09/07/2016   Procedure: Left Heart  Cath and Coronary Angiography;  Surgeon: Nelva Bush, MD;  Location: [facility];  Service: Cardiovascular;  Laterality: N/A;   TUBAL LIGATION     Family History  Problem Relation Age of Onset   Diabetes Mother    Diabetes Sister    Social History   Socioeconomic History   Marital status: Single    Spouse name: Not on file   Number of children: Not on file   Years of education: Not on file   Highest education level: Not on file  Occupational History   Not on file  Tobacco Use   Smoking status: Former    Types: Cigarettes    Quit date: 2009    Years since quitting: 14.8   Smokeless tobacco: Never  Substance and Sexual Activity   Alcohol use: Not Currently    Comment: occasionally   Drug use: No   Sexual activity: Not Currently  Other Topics Concern   Not on file  Social History Narrative   Not on file   Social Determinants of Health   Financial Resource Strain: Not on file  Food Insecurity: Not on file  Transportation Needs: Not on file  Physical Activity: Not on file  Stress: Not on file  Social Connections: Not on file    Tobacco Counseling Counseling given: Not Answered   Clinical Intake:     Pain : No/denies pain     BMI - recorded: 34.88 Nutritional Status: BMI > 30  Obese  How often do you need to have someone help you when you read instructions, pamphlets, or other written materials from your doctor or pharmacy?: 1 - Never  Diabetic?yes  Interpreter Needed?: No      Activities of Daily Living    04/09/2022    9:02 AM  In your present state of health, do you have any difficulty performing the following activities:  Hearing? 0  Vision? 0  Difficulty concentrating or making decisions? 0  Walking or climbing stairs? 0  Dressing or bathing? 0  Doing errands, shopping? 0  Preparing Food and eating ? N  Using the Toilet? N  In the past six months, have you accidently leaked urine? N  Do you have problems with loss of bowel  control? N  Managing your Medications? N  Managing your Finances? N  Housekeeping or managing your Housekeeping? N    Patient Care Team: Ladell Pier, MD as PCP - General (Internal Medicine) [place], Peter M, MD as PCP - Cardiology (Cardiology)  Indicate any recent Medical Services you may have received from other than [facility] providers in the past year (date may be approximate).     Assessment:   This is a routine wellness examination for [place].  Hearing/Vision screen No results found.  Dietary issues and exercise activities discussed: Current Exercise Habits: Structured exercise class, Type of exercise: walking;strength training/weights;Other - see comments, Frequency (Times/Week): 3   Goals Addressed   None   Depression Screen    04/09/2022    9:01 AM 06/03/2021   10:19 AM 04/08/2021    3:24 PM 10/07/2020    8:38 AM 02/02/2020    8:36 AM 01/22/2020   10:12 AM 10/03/2019    8:29 AM  PHQ 2/9 Scores  PHQ - 2 Score 0 0 0 0 0 0 0  PHQ- 9 Score  0         Fall Risk    04/09/2022    9:01 AM 02/02/2022    8:35 AM 06/03/2021   10:11 AM 04/08/2021    3:24 PM 10/07/2020    8:38 AM  Fall Risk   Falls in the past year? 0 0 0 0 0  Number falls in past yr: 0 0  0 0  Injury with Fall? 0 0  0 0  Risk for fall due to : No Fall Risks No Fall Risks  No Fall Risks No Fall Risks  Follow up Education provided Falls evaluation completed       FALL RISK PREVENTION PERTAINING TO THE HOME:  Fall risk was low.  ASSISTIVE DEVICES UTILIZED TO PREVENT FALLS:   Use of a cane, walker or w/c? No  Grab bars in the bathroom? No  Shower chair or bench in shower? No  Elevated toilet seat or a handicapped toilet? No   TIMED UP AND GO:  Was the test performed? Yes .  Length of time to ambulate 10 feet: 5-10 sec.   Gait steady and fast without use of assistive device  Cognitive Function:    04/09/2022    9:04 AM 04/08/2021    3:25 PM 01/22/2020   10:14 AM  MMSE - Mini Mental State Exam   Orientation to time _0 Orientation to Place _1 Registration _2 Attention/ Calculation _3 Recall _4 Language- name 2 objects _5 Language- repeat _6 Language- follow 3 step command _7 Language- read & follow direction _8 Write a sentence _9 Copy design 1 0 0  Total score _10 Immunizations Immunization History  Administered Date(s) Administered   Fluad Quad(high Dose 65+) 04/08/2021   Influenza Split 03/13/2013   Influenza Whole 04/22/2009   Influenza,inj,Quad PF,6+ Mos 07/09/2015, 02/11/2016, 02/16/2017, 03/25/2018, 02/09/2019, 03/20/2020, 02/02/2022   PFIZER(Purple Top)SARS-COV-2 Vaccination 07/13/2019, 08/03/2019, 04/09/2020, 09/30/2020   Pneumococcal Conjugate-13 02/16/2017   Pneumococcal Polysaccharide-23 04/22/2009, 01/03/2015   Td 04/22/2009   Tdap 01/22/2020   Zoster Recombinat (Shingrix) 02/11/2022    TDAP status: Up to date  Flu Vaccine status: Up to date  Pneumococcal vaccine status: Up to date  Covid-19 vaccine status: Information provided on how to obtain vaccines.   Qualifies for Shingles Vaccine? Yes   Zostavax completed No   Shingrix Completed?: Yes- patient received first dose at an outpatient pharmacy. Plans to get second dose in 1-4 months.   Screening Tests Health Maintenance  Topic Date Due   COVID-19 Vaccine (5 - Pfizer series) 11/25/2020   Zoster Vaccines- Shingrix (2 of 2) 04/08/2022   MAMMOGRAM  06/13/2022   HEMOGLOBIN A1C  08/05/2022   Diabetic  kidney evaluation - Urine ACR  10/03/2022   FOOT EXAM  10/03/2022   Diabetic kidney evaluation - GFR measurement  10/23/2022   OPHTHALMOLOGY EXAM  03/11/2023   Medicare Annual Wellness (AWV)  04/10/2023   COLONOSCOPY (Pts 45-68yr Insurance coverage will need to be confirmed)  11/14/2023   TETANUS/TDAP  01/21/2030   Pneumonia Vaccine 71 Years old  Completed   INFLUENZA VACCINE  Completed   DEXA SCAN  Completed   Hepatitis C Screening   Completed   HPV VACCINES  Aged Out    Health Maintenance  Health Maintenance Due  Topic Date Due   COVID-19 Vaccine (5 - Pfizer series) 11/25/2020   Zoster Vaccines- Shingrix (2 of 2) 04/08/2022    Colorectal cancer screening: Type of screening: Colonoscopy. Completed 2015. Repeat every 10 years  Mammogram status: Completed 2022. Repeat every year  Bone Density status: Completed 2022. Results reflect: Bone density results: OSTEOPENIA. Repeat every 2-3 years.  Lung Cancer Screening: (Low Dose CT Chest recommended if Age 73-80years, 30 pack-year currently smoking OR have quit w/in 15years.) does not qualify.   Lung Cancer Screening Referral: not needed  Additional Screening:  Hepatitis C Screening: does qualify; Completed 2017  Vision Screening: Recommended annual ophthalmology exams for early detection of glaucoma and other disorders of the eye. Is the patient up to date with their annual eye exam?  Yes   Dental Screening: Recommended annual dental exams for proper oral hygiene  Community Resource Referral / Chronic Care Management: CRR required this visit?  No   CCM required this visit?  No      Plan:     I have personally reviewed and noted the following in the patient's chart:   Medical and social history Use of alcohol, tobacco or illicit drugs  Current medications and supplements including opioid prescriptions. Patient is currently taking opioid prescriptions. Information provided to patient regarding non-opioid alternatives. Patient advised to discuss non-opioid treatment plan with their provider. Functional ability and status Nutritional status Physical activity Advanced directives List of other physicians Hospitalizations, surgeries, and ER visits in previous 12 months Vitals Screenings to include cognitive, depression, and falls Referrals and appointments  In addition, I have reviewed and discussed with patient certain preventive protocols, quality  metrics, and best practice recommendations. A written personalized care plan for preventive services as well as general preventive health recommendations were provided to patient.     AMaryan Puls R[facility]  04/09/2022

## 2022-04-12 ENCOUNTER — Other Ambulatory Visit: Payer: Self-pay | Admitting: Internal Medicine

## 2022-04-12 DIAGNOSIS — Z8739 Personal history of other diseases of the musculoskeletal system and connective tissue: Secondary | ICD-10-CM

## 2022-04-13 NOTE — Telephone Encounter (Signed)
Requested Prescriptions  Pending Prescriptions Disp Refills   indomethacin (INDOCIN) 50 MG capsule [Pharmacy Med Name: INDOMETHACIN 50 MG Capsule] 90 capsule 1    Sig: TAKE 1 CAPSULE TWICE DAILY AS NEEDED FOR ACUTE GOUT ATTACKS     Analgesics:  NSAIDS Failed - 04/12/2022  7:37 AM      Failed - Manual Review: Labs are only required if the patient has taken medication for more than 8 weeks.      Failed - Cr in normal range and within 360 days    Creat  Date Value Ref Range Status  08/03/2016 1.19 (H) 0.50 - 0.99 mg/dL Final    Comment:      For patients > or = 73 years of age: The upper reference limit for Creatinine is approximately 13% higher for people identified as African-American.      Creatinine, Ser  Date Value Ref Range Status  10/22/2021 1.02 (H) 0.44 - 1.00 mg/dL Final   Creatinine, Urine  Date Value Ref Range Status  07/09/2015 120 20 - 320 mg/dL Final         Passed - HGB in normal range and within 360 days    Hemoglobin  Date Value Ref Range Status  10/22/2021 12.1 12.0 - 15.0 g/dL Final  10/02/2021 11.9 11.1 - 15.9 g/dL Final         Passed - PLT in normal range and within 360 days    Platelets  Date Value Ref Range Status  10/22/2021 265 150 - 400 K/uL Final  10/02/2021 270 150 - 450 x10E3/uL Final         Passed - HCT in normal range and within 360 days    HCT  Date Value Ref Range Status  10/22/2021 38.3 36.0 - 46.0 % Final   Hematocrit  Date Value Ref Range Status  10/02/2021 35.9 34.0 - 46.6 % Final         Passed - eGFR is 30 or above and within 360 days    GFR, Est African American  Date Value Ref Range Status  08/03/2016 55 (L) >=60 mL/min Final   GFR calc Af Amer  Date Value Ref Range Status  10/09/2019 58 (L) >59 mL/min/1.73 Final    Comment:    **[facility] currently reports eGFR in compliance with the current**   recommendations of the [employer]. [facility] will   update reporting as new guidelines are published from  the NKF-ASN   Task force.    GFR, Est Non African American  Date Value Ref Range Status  08/03/2016 47 (L) >=60 mL/min Final   GFR, Estimated  Date Value Ref Range Status  10/22/2021 58 (L) >60 mL/min Final    Comment:    (NOTE) Calculated using the CKD-EPI Creatinine Equation (2021)    eGFR  Date Value Ref Range Status  10/02/2021 51 (L) >59 mL/min/1.73 Final         Passed - Patient is not pregnant      Passed - Valid encounter within last 12 months    Recent Outpatient Visits           4 days ago Encounter for [employer] annual wellness exam   [place], RPH-CPP   2 months ago Type 2 diabetes mellitus with obesity [facility])   [place] Karle Plumber B, MD   6 months ago Type 2 diabetes mellitus with obesity ([place])   [place], MD   10 months ago Type 2 diabetes mellitus with obesity [facility])   [place] Ladell Pier, MD   1 year ago Need for immunization against influenza   [place], MD       Future Appointments             In 2 months Wynetta Emery Dalbert Batman, MD [place]

## 2022-05-16 ENCOUNTER — Other Ambulatory Visit: Payer: Self-pay | Admitting: Internal Medicine

## 2022-05-16 DIAGNOSIS — E1169 Type 2 diabetes mellitus with other specified complication: Secondary | ICD-10-CM

## 2022-06-12 ENCOUNTER — Ambulatory Visit: Payer: Medicare HMO | Attending: Internal Medicine | Admitting: Internal Medicine

## 2022-06-12 ENCOUNTER — Other Ambulatory Visit: Payer: Self-pay

## 2022-06-12 ENCOUNTER — Encounter: Payer: Self-pay | Admitting: Internal Medicine

## 2022-06-12 VITALS — BP 160/80 | HR 67 | Temp 97.8°F | Ht 67.0 in | Wt 229.0 lb

## 2022-06-12 DIAGNOSIS — E669 Obesity, unspecified: Secondary | ICD-10-CM | POA: Diagnosis not present

## 2022-06-12 DIAGNOSIS — E1169 Type 2 diabetes mellitus with other specified complication: Secondary | ICD-10-CM | POA: Diagnosis not present

## 2022-06-12 DIAGNOSIS — N1831 Chronic kidney disease, stage 3a: Secondary | ICD-10-CM

## 2022-06-12 DIAGNOSIS — I152 Hypertension secondary to endocrine disorders: Secondary | ICD-10-CM

## 2022-06-12 DIAGNOSIS — E785 Hyperlipidemia, unspecified: Secondary | ICD-10-CM

## 2022-06-12 DIAGNOSIS — Z8739 Personal history of other diseases of the musculoskeletal system and connective tissue: Secondary | ICD-10-CM

## 2022-06-12 DIAGNOSIS — Z2911 Encounter for prophylactic immunotherapy for respiratory syncytial virus (RSV): Secondary | ICD-10-CM

## 2022-06-12 DIAGNOSIS — E1159 Type 2 diabetes mellitus with other circulatory complications: Secondary | ICD-10-CM

## 2022-06-12 LAB — POCT GLYCOSYLATED HEMOGLOBIN (HGB A1C): HbA1c, POC (controlled diabetic range): 6 % (ref 0.0–7.0)

## 2022-06-12 LAB — GLUCOSE, POCT (MANUAL RESULT ENTRY): POC Glucose: 115 mg/dl — AB (ref 70–99)

## 2022-06-12 MED ORDER — RSVPREF3 VAC RECOMB ADJUVANTED 120 MCG/0.5ML IM SUSR
0.5000 mL | Freq: Once | INTRAMUSCULAR | 0 refills | Status: AC
Start: 2022-06-12 — End: 2022-06-13
  Filled 2022-06-12 (×3): qty 1, 1d supply, fill #0

## 2022-06-12 NOTE — Progress Notes (Signed)
Patient ID: JAZZMINE KLEIMAN, female    DOB: [birth year]  MRN: [id number]  CC: Diabetes (DM f/u. Neoma Laming received flu vax.)   Subjective: Malone Admire is a 72 y.o. female who presents for chronic ds management Her concerns today include:  Pt with hx of HTN, HL, DM, CKD stage 3, insomnia non-obstructing CAD (cath 08/2016, for med management), RT CTS, OA knees, gout, IDA.   HTN/CAD/HL: Reports compliance with carvedilol 25 mg BID, isosorbide 20 mg TID, hydralazine 50 mg TID.  Took meds already for this a.m Also taking atorvastatin 10 mg daily and aspirin Checks BP 1x/wk.  Last reading was 120s/70 last wk Limit salt in the foods. No chest pains, shortness of breath, lower extremity edema, HA.  DM: BS 115/A1C 6 Checks BS 1-2x/wk before BF, range 84-100 Reports compliance with metformin 1 g daily.  CKD 3a: Last GFR was 58 in May of last year.  She takes Indocin sparingly whenever she gets a flare of gout.  Last used 2 days ago for gout pain in LT foot. Had to take it just once and that took care of it.  Prior to that she took it twice the wk of Christmas.  Notice that when she goes a lot of walking, she may get a flare in the bunions.  Colchicine still on med list.    When she takes the Colchicine when she wakes up with gout flare, it does not work. Has a device that she purchased on-line that sends electrical pulses in the body through the feet.  Called [employer], saw it advertised on TV and on the internet.  Suppose to relax feet and leg muscles, help with circulation and neuropathy.  She has used it about 3 x but there are cautions of not using it every day and not for more than 15 mins.  Wants to know if I have heard of it and whether it is of any benefit.  HM: had shingles vac x 2 and COVID booster at [employer] on [employer].   Patient Active Problem List   Diagnosis Date Noted   Hypertension associated with stage 3a chronic kidney disease due to type 2 diabetes mellitus  ([place]) 10/07/2020   Hyperlipidemia associated with type 2 diabetes mellitus ([place]) 10/07/2020   Arthritis of finger of left hand 08/05/2018   Primary osteoarthritis of both knees 10/29/2017   CAD (coronary artery disease) 02/08/2017   Insomnia 11/06/2016   Iron deficiency anemia 09/14/2016   Shoulder pain, left 02/11/2016   Right carpal tunnel syndrome 07/29/2014   Vitamin D insufficiency 04/13/2014   Obesity (BMI 30-39.9) 03/13/2013   Gout 05/20/2009   HEART MURMUR, SYSTOLIC 16/12/3708   DM2 (diabetes mellitus, type 2) ([place]) 04/22/2009   Essential hypertension 04/22/2009     Current Outpatient Medications on File Prior to Visit  Medication Sig Dispense Refill   albuterol (VENTOLIN HFA) 108 (90 Base) MCG/ACT inhaler Inhale 2 puffs into the lungs every 6 (six) hours as needed for wheezing or shortness of breath. 3 each 2   aspirin EC 81 MG tablet Take 1 tablet (81 mg total) by mouth daily. 90 tablet 3   atorvastatin (LIPITOR) 10 MG tablet Take 1 tablet (10 mg total) by mouth daily. 90 tablet 4   Blood Glucose Monitoring Suppl (ONETOUCH VERIO) w/Device KIT Use to check blood sugar once daily. 1 kit 0   Blood Pressure KIT 1 each by Does not apply route daily. 1 each 0  carvedilol (COREG) 25 MG tablet TAKE 1 TABLET BY MOUTH 2 TIMES DAILY WITH MEALS 180 tablet 4   diclofenac Sodium (VOLTAREN) 1 % GEL APPLY 2 GRAMS TOPICALLY FOUR TIMES DAILY 700 g 0   gabapentin (NEURONTIN) 100 MG capsule TAKE 3 CAPSULES AT BEDTIME 270 capsule 3   hydrALAZINE (APRESOLINE) 50 MG tablet Take 1 tablet (50 mg total) by mouth 3 (three) times daily. 270 tablet 2   indomethacin (INDOCIN) 50 MG capsule TAKE 1 CAPSULE TWICE DAILY AS NEEDED FOR ACUTE GOUT ATTACKS 90 capsule 1   isosorbide dinitrate (ISORDIL) 20 MG tablet Take 1 tablet (20 mg total) by mouth 3 (three) times daily. 270 tablet 4   loratadine (CLARITIN) 10 MG tablet Take 1 tablet (10 mg total) by mouth daily as needed for allergies. 90 tablet 2   metFORMIN  (GLUCOPHAGE-XR) 500 MG 24 hr tablet Take 2 tablets (1,000 mg total) by mouth daily with breakfast. 180 tablet 4   traMADol (ULTRAM) 50 MG tablet TAKE 1 TABLET (50 MG TOTAL) BY MOUTH DAILY AS NEEDED FOR MODERATE PAIN. 30 tablet 0   Triamcinolone Acetonide (TRIAMCINOLONE 0.1 % CREAM : EUCERIN) CREA Apply 1 application topically 2 (two) times daily as needed. Please mix 1:1 (Patient taking differently: Apply 1 application  topically 2 (two) times daily as needed. Please mix 1:1) 1 each 3   triamcinolone cream (KENALOG) 0.1 % APPLY TOPICALLY TWICE DAILY AS NEEDED. 60 g 0   TRUE METRIX BLOOD GLUCOSE TEST test strip TEST BLOOD SUGAR EVERY DAY 100 strip 1   TRUEplus Lancets 33G MISC TEST BLOOD SUGAR EVERY DAY 100 each 1   No current facility-administered medications on file prior to visit.    Allergies  Allergen Reactions   Lisinopril Swelling    REACTION: Swolen lips (only)   Hctz [Hydrochlorothiazide] Other (See Comments)    Joint pain    Naproxen Hives, Itching and Palpitations   Norvasc [Amlodipine Besylate] Other (See Comments)    Peripheral edema     Social History   Socioeconomic History   Marital status: Single    Spouse name: Not on file   Number of children: Not on file   Years of education: Not on file   Highest education level: Not on file  Occupational History   Not on file  Tobacco Use   Smoking status: Former    Types: Cigarettes    Quit date: 2009    Years since quitting: 15.0   Smokeless tobacco: Never  Substance and Sexual Activity   Alcohol use: Not Currently    Comment: occasionally   Drug use: No   Sexual activity: Not Currently  Other Topics Concern   Not on file  Social History Narrative   Not on file   Social Determinants of Health   Financial Resource Strain: Not on file  Food Insecurity: Not on file  Transportation Needs: Not on file  Physical Activity: Not on file  Stress: Not on file  Social Connections: Not on file  Intimate Partner  Violence: Not on file    Family History  Problem Relation Age of Onset   Diabetes Mother    Diabetes Sister     Past Surgical History:  Procedure Laterality Date   APPENDECTOMY     INTRAVASCULAR PRESSURE WIRE/FFR STUDY N/A 09/07/2016   Procedure: Intravascular Pressure Wire/FFR Study;  Surgeon: Nelva Bush, MD;  Location: [facility];  Service: Cardiovascular;  Laterality: N/A;   LEFT HEART CATH AND CORONARY ANGIOGRAPHY N/A  09/07/2016   Procedure: Left Heart Cath and Coronary Angiography;  Surgeon: Nelva Bush, MD;  Location: [facility];  Service: Cardiovascular;  Laterality: N/A;   TUBAL LIGATION      ROS: Review of Systems Negative except as stated above  PHYSICAL EXAM: BP (!) 160/80   Pulse 67   Temp 97.8 F (36.6 C) (Oral)   Ht '5\' 7"'$  (1.702 m)   Wt 229 lb (103.9 kg)   SpO2 98%   BMI 35.87 kg/m   Physical Exam  General appearance - alert, well appearing, elderly African-American female and in no distress Mental status - normal mood, behavior, speech, dress, motor activity, and thought processes Chest - clear to auscultation, no wheezes, rales or rhonchi, symmetric air entry Heart - normal rate, regular rhythm, normal S1, S2, no murmurs, rubs, clicks or gallops Extremities - peripheral pulses normal, no pedal edema, no clubbing or cyanosis Diabetic Foot Exam - Simple   Simple Foot Form Diabetic Foot exam was performed with the following findings: Yes 06/12/2022  9:17 AM  Visual Inspection See comments: Yes Sensation Testing Intact to touch and monofilament testing bilaterally: Yes Pulse Check Posterior Tibialis and Dorsalis pulse intact bilaterally: Yes Comments Non-inflam bunions, slight flat footed         Latest Ref Rng & Units 10/22/2021    4:51 PM 10/02/2021    9:35 AM 06/03/2021   10:53 AM  CMP  Glucose 70 - 99 mg/dL 130  107  95   BUN 8 - 23 mg/dL '12  14  16   '$ Creatinine 0.44 - 1.00 mg/dL 1.02  1.14  1.11   Sodium 135 - 145  mmol/L 138  140  143   Potassium 3.5 - 5.1 mmol/L 3.8  4.2  4.2   Chloride 98 - 111 mmol/L 109  106  109   CO2 22 - 32 mmol/L '20  20  21   '$ Calcium 8.9 - 10.3 mg/dL 9.1  9.5  9.2   Total Protein 6.5 - 8.1 g/dL 7.2  7.1    Total Bilirubin 0.3 - 1.2 mg/dL 1.1  0.4    Alkaline Phos 38 - 126 U/L 66  85    AST 15 - 41 U/L 19  18    ALT 0 - 44 U/L 13  12     Lipid Panel     Component Value Date/Time   CHOL 152 10/02/2021 0935   TRIG 124 10/02/2021 0935   HDL 52 10/02/2021 0935   CHOLHDL 2.9 10/02/2021 0935   CHOLHDL 3.5 09/05/2016 0228   VLDL 22 09/05/2016 0228   LDLCALC 78 10/02/2021 0935    CBC    Component Value Date/Time   WBC 6.7 10/22/2021 1651   RBC 4.10 10/22/2021 1651   HGB 12.1 10/22/2021 1651   HGB 11.9 10/02/2021 0935   HCT 38.3 10/22/2021 1651   HCT 35.9 10/02/2021 0935   PLT 265 10/22/2021 1651   PLT 270 10/02/2021 0935   MCV 93.4 10/22/2021 1651   MCV 88 10/02/2021 0935   MCH 29.5 10/22/2021 1651   MCHC 31.6 10/22/2021 1651   RDW 14.5 10/22/2021 1651   RDW 13.6 10/02/2021 0935   LYMPHSABS 2.6 04/22/2009 2257   MONOABS 0.6 04/22/2009 2257   EOSABS 0.1 04/22/2009 2257   BASOSABS 0.0 04/22/2009 2257    ASSESSMENT AND PLAN:  1. Type 2 diabetes mellitus with obesity (HCC) At goal.  Continue metformin and healthy eating habits. - POCT glucose (manual entry) -  POCT glycosylated hemoglobin (Hb A1C)  2. Hypertension associated with diabetes ([place]) Not at goal in office today.  However she reports good blood pressure readings at home.  We discussed increasing the dose of hydralazine from 50 mg to 100 mg 3 times a day.  Patient wants to hold off on doing so.  She is agreeable to seeing the clinical pharmacist in several weeks for repeat blood pressure check.  For now she will continue the isosorbide 20 mg 3 times a day, hydralazine 50 mg 3 times a day, carvedilol 25 mg twice a day Advised to check blood pressure at least twice a week and record the readings.  Bring  readings with her when she comes to see the clinical pharmacist. - Comprehensive metabolic panel  3. Hyperlipidemia associated with type 2 diabetes mellitus (HCC) Continue atorvastatin.  4. History of gout Uloric contraindicated in people with CAD. I spoke with her about starting allopurinol to help decrease the frequency of gout episodes.  Patient declined stating she does not want to be on more medications at this time. - Uric Acid  5. Stage 3a chronic kidney disease (HCC) - Comprehensive metabolic panel  6. Need for RSV vaccination Discussed recommendation for the RSV vaccine.  Patient agreeable to getting the vaccine.  Prescription given for her to take to our pharmacy or her pharmacy at [facility]. - RSV vaccine recomb adjuvanted (AREXVY) 120 MCG/0.5ML injection; Inject 0.5 mLs into the muscle once for 1 dose.  Dispense: 0.5 mL; Refill: 0    Patient was given the opportunity to ask questions.  Patient verbalized understanding of the plan and was able to repeat key elements of the plan.   This documentation was completed using Radio producer.  Any transcriptional errors are unintentional.  Orders Placed This Encounter  Procedures   Comprehensive metabolic panel   Uric Acid   POCT glucose (manual entry)   POCT glycosylated hemoglobin (Hb A1C)     Requested Prescriptions   Signed Prescriptions Disp Refills   RSV vaccine recomb adjuvanted (AREXVY) 120 MCG/0.5ML injection 1 each 0    Sig: Inject 0.5 mLs into the muscle once for 1 dose.    Return in about 4 months (around 10/11/2022) for Give Appt with [facility] in 2-4 wks for BP check.  Karle Plumber, MD, FACP

## 2022-06-13 LAB — COMPREHENSIVE METABOLIC PANEL
ALT: 10 IU/L (ref 0–32)
AST: 17 IU/L (ref 0–40)
Albumin/Globulin Ratio: 1.4 (ref 1.2–2.2)
Albumin: 4.1 g/dL (ref 3.8–4.8)
Alkaline Phosphatase: 87 IU/L (ref 44–121)
BUN/Creatinine Ratio: 14 (ref 12–28)
BUN: 15 mg/dL (ref 8–27)
Bilirubin Total: 0.3 mg/dL (ref 0.0–1.2)
CO2: 18 mmol/L — ABNORMAL LOW (ref 20–29)
Calcium: 9.7 mg/dL (ref 8.7–10.3)
Chloride: 106 mmol/L (ref 96–106)
Creatinine, Ser: 1.06 mg/dL — ABNORMAL HIGH (ref 0.57–1.00)
Globulin, Total: 2.9 g/dL (ref 1.5–4.5)
Glucose: 89 mg/dL (ref 70–99)
Potassium: 4.5 mmol/L (ref 3.5–5.2)
Sodium: 141 mmol/L (ref 134–144)
Total Protein: 7 g/dL (ref 6.0–8.5)
eGFR: 55 mL/min/{1.73_m2} — ABNORMAL LOW (ref >59)

## 2022-06-13 LAB — URIC ACID: Uric Acid: 7.7 mg/dL (ref 3.1–7.9)

## 2022-06-29 ENCOUNTER — Other Ambulatory Visit: Payer: Self-pay | Admitting: Internal Medicine

## 2022-06-29 DIAGNOSIS — L299 Pruritus, unspecified: Secondary | ICD-10-CM

## 2022-06-30 NOTE — Telephone Encounter (Signed)
Requested medication (s) are due for refill today: yes  Requested medication (s) are on the active medication list: yes  Last refill:  02/19/22 60 grams  Future visit scheduled: yes  Notes to clinic:  med not delegated to NT to RF   Requested Prescriptions  Pending Prescriptions Disp Refills   triamcinolone cream (KENALOG) 0.1 % [Pharmacy Med Name: TRIAMCINOLONE ACETONIDE 0.1 % Cream] 60 g 3    Sig: APPLY TOPICALLY TWICE DAILY AS NEEDED.     Not Delegated - Dermatology:  Corticosteroids Failed - 06/29/2022  3:42 AM      Failed - This refill cannot be delegated      Passed - Valid encounter within last 12 months    Recent Outpatient Visits           2 weeks ago Type 2 diabetes mellitus with obesity [facility])   [place] Ladell Pier, MD   2 months ago Encounter for [employer] annual wellness exam   [place], [place] L, RPH-CPP   4 months ago Type 2 diabetes mellitus with obesity [facility])   [place] Karle Plumber B, MD   9 months ago Type 2 diabetes mellitus with obesity [facility])   [place] Ladell Pier, MD   1 year ago Type 2 diabetes mellitus with obesity [facility])   [place], MD       Future Appointments             In 3 weeks Daisy Blossom, Jarome Matin, [place]   In 3 months Wynetta Emery, Dalbert Batman, MD [place]

## 2022-07-04 ENCOUNTER — Other Ambulatory Visit: Payer: Self-pay | Admitting: Internal Medicine

## 2022-07-04 DIAGNOSIS — Z8739 Personal history of other diseases of the musculoskeletal system and connective tissue: Secondary | ICD-10-CM

## 2022-07-06 NOTE — Telephone Encounter (Signed)
Requested Prescriptions  Pending Prescriptions Disp Refills   indomethacin (INDOCIN) 50 MG capsule [Pharmacy Med Name: INDOMETHACIN 50 MG Capsule] 90 capsule 3    Sig: TAKE 1 CAPSULE TWICE DAILY AS NEEDED FOR ACUTE GOUT ATTACKS     Analgesics:  NSAIDS Failed - 07/04/2022  3:37 AM      Failed - Manual Review: Labs are only required if the patient has taken medication for more than 8 weeks.      Failed - Cr in normal range and within 360 days    Creat  Date Value Ref Range Status  08/03/2016 1.19 (H) 0.50 - 0.99 mg/dL Final    Comment:      For patients > or = 74 years of age: The upper reference limit for Creatinine is approximately 13% higher for people identified as African-American.      Creatinine, Ser  Date Value Ref Range Status  06/12/2022 1.06 (H) 0.57 - 1.00 mg/dL Final   Creatinine, Urine  Date Value Ref Range Status  07/09/2015 120 20 - 320 mg/dL Final         Passed - HGB in normal range and within 360 days    Hemoglobin  Date Value Ref Range Status  10/22/2021 12.1 12.0 - 15.0 g/dL Final  10/02/2021 11.9 11.1 - 15.9 g/dL Final         Passed - PLT in normal range and within 360 days    Platelets  Date Value Ref Range Status  10/22/2021 265 150 - 400 K/uL Final  10/02/2021 270 150 - 450 x10E3/uL Final         Passed - HCT in normal range and within 360 days    HCT  Date Value Ref Range Status  10/22/2021 38.3 36.0 - 46.0 % Final   Hematocrit  Date Value Ref Range Status  10/02/2021 35.9 34.0 - 46.6 % Final         Passed - eGFR is 30 or above and within 360 days    GFR, Est African American  Date Value Ref Range Status  08/03/2016 55 (L) >=60 mL/min Final   GFR calc Af Amer  Date Value Ref Range Status  10/09/2019 58 (L) >59 mL/min/1.73 Final    Comment:    **[facility] currently reports eGFR in compliance with the current**   recommendations of the [employer]. [facility] will   update reporting as new guidelines are published from  the NKF-ASN   Task force.    GFR, Est Non African American  Date Value Ref Range Status  08/03/2016 47 (L) >=60 mL/min Final   GFR, Estimated  Date Value Ref Range Status  10/22/2021 58 (L) >60 mL/min Final    Comment:    (NOTE) Calculated using the CKD-EPI Creatinine Equation (2021)    eGFR  Date Value Ref Range Status  06/12/2022 55 (L) >59 mL/min/1.73 Final         Passed - Patient is not pregnant      Passed - Valid encounter within last 12 months    Recent Outpatient Visits           3 weeks ago Type 2 diabetes mellitus with obesity [facility])   [place] Ladell Pier, MD   2 months ago Encounter for [employer] annual wellness exam   [place], RPH-CPP   5 months ago Type 2 diabetes mellitus with obesity ([place])  [place] Karle Plumber B, MD   9 months ago Type 2 diabetes mellitus with obesity [facility])   [place] Karle Plumber B, MD   1 year ago Type 2 diabetes mellitus with obesity [facility])   [place] Ladell Pier, MD       Future Appointments             In 2 weeks Daisy Blossom, Jarome Matin, [place]   In 3 months Wynetta Emery, Dalbert Batman, MD [place]

## 2022-07-21 ENCOUNTER — Ambulatory Visit: Payer: Medicare HMO | Attending: Family Medicine | Admitting: Pharmacist

## 2022-07-21 VITALS — BP 134/74 | HR 60

## 2022-07-21 DIAGNOSIS — I152 Hypertension secondary to endocrine disorders: Secondary | ICD-10-CM | POA: Diagnosis not present

## 2022-07-21 DIAGNOSIS — E1159 Type 2 diabetes mellitus with other circulatory complications: Secondary | ICD-10-CM | POA: Diagnosis not present

## 2022-07-21 NOTE — Progress Notes (Signed)
S:     No chief complaint on file.  72 y.o. female who presents for hypertension evaluation, education, and management.  PMH is significant for CAD, T2DM, HLD, HTN, gout.  Patient was referred and last seen by Primary Care Provider, Dr. Wynetta Emery, on 06/12/22.  At last visit, BP was 160/80. Discussed increasing hydralazine, but patient wanted to hold off.  Today, patient arrives in good spirits and presents without assistance. Denies dizziness, headache, blurred vision, swelling.   Patient reports hypertension was diagnosed in 2010.   Family hx: diabetes Social history: Former smoker, quit in 2009.  Medication adherence appears appropriate. Patient has taken BP medications today.   Current antihypertensives include: carvedilol 25 mg BID, isosorbide dinitrate 20 mg TID, hydralazine 50 mg TID  Antihypertensives tried in the past include: lisinopril (lip swelling), amlodipine (peripheral edema), HCTZ (gout flare)  Reported home BP readings: 120-130/80-90. Reports her BP cuff is broken, so has not gotten measurements in around 2 weeks.  Patient reported dietary habits: Reports restricting sodium, does not eat a lot of red meat. Patient-reported exercise habits: Goes to the gym most days a weeks. Reports using the treadmill, weights, and swimming.  ASCVD risk factors include: CAD, HTN, T2DM  O:  Vitals:   07/21/22 0849  BP: 134/74  Pulse: 60   Last 3 Office BP readings: BP Readings from Last 3 Encounters:  07/21/22 134/74  06/12/22 (!) 160/80  04/09/22 117/66    BMET    Component Value Date/Time   NA 141 06/12/2022 0928   K 4.5 06/12/2022 0928   CL 106 06/12/2022 0928   CO2 18 (L) 06/12/2022 0928   GLUCOSE 89 06/12/2022 0928   GLUCOSE 130 (H) 10/22/2021 1651   BUN 15 06/12/2022 0928   CREATININE 1.06 (H) 06/12/2022 0928   CREATININE 1.19 (H) 08/03/2016 1529   CALCIUM 9.7 06/12/2022 0928   GFRNONAA 58 (L) 10/22/2021 1651   GFRNONAA 47 (L) 08/03/2016 1529   GFRAA  58 (L) 10/09/2019 0852   GFRAA 55 (L) 08/03/2016 1529    Renal function: CrCl cannot be calculated (Patient's most recent lab result is older than the maximum 21 days allowed.).  Clinical ASCVD: Yes  The 10-year ASCVD risk score (Arnett DK, et al., 2019) is: 25.6%   Values used to calculate the score:     Age: 46 years     Sex: Female     Is Non-Hispanic African American: Yes     Diabetic: Yes     Tobacco smoker: No     Systolic Blood Pressure: [id number] mmHg     Is BP treated: Yes     HDL Cholesterol: 52 mg/dL     Total Cholesterol: 152 mg/dL  Patient is participating in a Managed Medicaid Plan: No    A/P: Hypertension diagnosed in 2010 currently controlled on current medications. BP goal < 130/80 mmHg. Medication adherence appears appropriate. -Continued carvedilol 25 mg BID. -Continued isosorbide dinatrate 20 mg TID. -Continued hydralazine 50 mg TID.  -Counseled on lifestyle modifications for blood pressure control including reduced dietary sodium, increased exercise, adequate sleep. -Encouraged patient to check BP at home and bring log of readings to next visit. Counseled on proper use of home BP cuff.   Results reviewed and written information provided.    Written patient instructions provided. Patient verbalized understanding of treatment plan.  Total time in face to face counseling 30 minutes.    Follow-up:  Pharmacist in 1 month. PCP clinic visit in May 2024.  Patient seen with: Lillard Anes PharmD candidate co [id number] [facility]   Benard Halsted, PharmD, [place], [place] [phone]

## 2022-07-22 ENCOUNTER — Other Ambulatory Visit: Payer: Self-pay

## 2022-08-17 ENCOUNTER — Other Ambulatory Visit: Payer: Self-pay | Admitting: Internal Medicine

## 2022-08-17 DIAGNOSIS — M170 Bilateral primary osteoarthritis of knee: Secondary | ICD-10-CM

## 2022-08-25 ENCOUNTER — Ambulatory Visit: Payer: Self-pay | Admitting: Pharmacist

## 2022-09-14 DIAGNOSIS — H5203 Hypermetropia, bilateral: Secondary | ICD-10-CM | POA: Diagnosis not present

## 2022-09-16 ENCOUNTER — Other Ambulatory Visit: Payer: Self-pay | Admitting: Internal Medicine

## 2022-09-16 DIAGNOSIS — E1159 Type 2 diabetes mellitus with other circulatory complications: Secondary | ICD-10-CM

## 2022-09-16 MED ORDER — HYDRALAZINE HCL 50 MG PO TABS: 50.0000 mg | ORAL_TABLET | Freq: Three times a day (TID) | ORAL | 2 refills | Status: DC

## 2022-09-17 ENCOUNTER — Ambulatory Visit: Payer: Medicare HMO | Admitting: Pharmacist

## 2022-09-26 ENCOUNTER — Other Ambulatory Visit: Payer: Self-pay | Admitting: Internal Medicine

## 2022-09-26 DIAGNOSIS — Z8739 Personal history of other diseases of the musculoskeletal system and connective tissue: Secondary | ICD-10-CM

## 2022-09-28 NOTE — Telephone Encounter (Signed)
Requested Prescriptions  Pending Prescriptions Disp Refills   indomethacin (INDOCIN) 50 MG capsule [Pharmacy Med Name: INDOMETHACIN 50 MG Capsule] 180 capsule 0    Sig: TAKE 1 CAPSULE TWICE DAILY AS NEEDED FOR ACUTE GOUT ATTACKS     Analgesics:  NSAIDS Failed - 09/26/2022  2:56 AM      Failed - Manual Review: Labs are only required if the patient has taken medication for more than 8 weeks.      Failed - Cr in normal range and within 360 days    Creat  Date Value Ref Range Status  08/03/2016 1.19 (H) 0.50 - 0.99 mg/dL Final    Comment:      For patients > or = 74 years of age: The upper reference limit for Creatinine is approximately 13% higher for people identified as African-American.      Creatinine, Ser  Date Value Ref Range Status  06/12/2022 1.06 (H) 0.57 - 1.00 mg/dL Final   Creatinine, Urine  Date Value Ref Range Status  07/09/2015 120 20 - 320 mg/dL Final         Passed - HGB in normal range and within 360 days    Hemoglobin  Date Value Ref Range Status  10/22/2021 12.1 12.0 - 15.0 g/dL Final  11/91/4782 95.6 11.1 - 15.9 g/dL Final         Passed - PLT in normal range and within 360 days    Platelets  Date Value Ref Range Status  10/22/2021 265 150 - 400 K/uL Final  10/02/2021 270 150 - 450 x10E3/uL Final         Passed - HCT in normal range and within 360 days    HCT  Date Value Ref Range Status  10/22/2021 38.3 36.0 - 46.0 % Final   Hematocrit  Date Value Ref Range Status  10/02/2021 35.9 34.0 - 46.6 % Final         Passed - eGFR is 30 or above and within 360 days    GFR, Est African American  Date Value Ref Range Status  08/03/2016 55 (L) >=60 mL/min Final   GFR calc Af Amer  Date Value Ref Range Status  10/09/2019 58 (L) >59 mL/min/1.73 Final    Comment:    **[facility] currently reports eGFR in compliance with the current**   recommendations of the [employer]. [facility] will   update reporting as new guidelines are published from  the NKF-ASN   Task force.    GFR, Est Non African American  Date Value Ref Range Status  08/03/2016 47 (L) >=60 mL/min Final   GFR, Estimated  Date Value Ref Range Status  10/22/2021 58 (L) >60 mL/min Final    Comment:    (NOTE) Calculated using the CKD-EPI Creatinine Equation (2021)    eGFR  Date Value Ref Range Status  06/12/2022 55 (L) >59 mL/min/1.73 Final         Passed - Patient is not pregnant      Passed - Valid encounter within last 12 months    Recent Outpatient Visits           2 months ago Hypertension associated with diabetes [facility])   [place] [facility] [place], [place] L, RPH-CPP   3 months ago Type 2 diabetes mellitus with obesity [facility])   [place] [facility] Marcine Matar, MD   5 months ago Encounter for [employer] annual wellness exam   [place] [facility]  [facility] [place], [place] L, RPH-CPP   7 months ago Type 2 diabetes mellitus with obesity [facility])   [place] [facility] Jonah Blue B, MD   12 months ago Type 2 diabetes mellitus with obesity [facility])   [place] [facility] Marcine Matar, MD       Future Appointments             In 2 weeks Marcine Matar, MD [facility]

## 2022-10-11 ENCOUNTER — Other Ambulatory Visit: Payer: Self-pay | Admitting: Internal Medicine

## 2022-10-11 DIAGNOSIS — I2510 Atherosclerotic heart disease of native coronary artery without angina pectoris: Secondary | ICD-10-CM

## 2022-10-11 DIAGNOSIS — E1169 Type 2 diabetes mellitus with other specified complication: Secondary | ICD-10-CM

## 2022-10-15 ENCOUNTER — Ambulatory Visit: Payer: Medicare HMO | Attending: Internal Medicine | Admitting: Internal Medicine

## 2022-10-15 ENCOUNTER — Encounter: Payer: Self-pay | Admitting: Internal Medicine

## 2022-10-15 VITALS — BP 131/73 | HR 67 | Temp 97.6°F | Ht 67.0 in | Wt 230.0 lb

## 2022-10-15 DIAGNOSIS — Z7984 Long term (current) use of oral hypoglycemic drugs: Secondary | ICD-10-CM

## 2022-10-15 DIAGNOSIS — E1159 Type 2 diabetes mellitus with other circulatory complications: Secondary | ICD-10-CM | POA: Diagnosis not present

## 2022-10-15 DIAGNOSIS — E1169 Type 2 diabetes mellitus with other specified complication: Secondary | ICD-10-CM

## 2022-10-15 DIAGNOSIS — M170 Bilateral primary osteoarthritis of knee: Secondary | ICD-10-CM | POA: Diagnosis not present

## 2022-10-15 DIAGNOSIS — Z1231 Encounter for screening mammogram for malignant neoplasm of breast: Secondary | ICD-10-CM

## 2022-10-15 DIAGNOSIS — N1831 Chronic kidney disease, stage 3a: Secondary | ICD-10-CM | POA: Diagnosis not present

## 2022-10-15 DIAGNOSIS — I2510 Atherosclerotic heart disease of native coronary artery without angina pectoris: Secondary | ICD-10-CM | POA: Diagnosis not present

## 2022-10-15 DIAGNOSIS — E785 Hyperlipidemia, unspecified: Secondary | ICD-10-CM | POA: Diagnosis not present

## 2022-10-15 DIAGNOSIS — E669 Obesity, unspecified: Secondary | ICD-10-CM | POA: Diagnosis not present

## 2022-10-15 DIAGNOSIS — D508 Other iron deficiency anemias: Secondary | ICD-10-CM | POA: Diagnosis not present

## 2022-10-15 DIAGNOSIS — I152 Hypertension secondary to endocrine disorders: Secondary | ICD-10-CM

## 2022-10-15 DIAGNOSIS — Z8739 Personal history of other diseases of the musculoskeletal system and connective tissue: Secondary | ICD-10-CM

## 2022-10-15 LAB — POCT GLYCOSYLATED HEMOGLOBIN (HGB A1C): HbA1c, POC (controlled diabetic range): 6.1 % (ref 0.0–7.0)

## 2022-10-15 LAB — GLUCOSE, POCT (MANUAL RESULT ENTRY): POC Glucose: 119 mg/dl — AB (ref 70–99)

## 2022-10-15 MED ORDER — DICLOFENAC SODIUM 1 % EX GEL: CUTANEOUS | 0 refills | Status: DC

## 2022-10-15 NOTE — Progress Notes (Addendum)
Patient ID: Desiree Sanchez, female    DOB: [birth year]  MRN: [id number]  CC: Diabetes (DM & HTN f/u. Med refill. /)   Subjective: Desiree Sanchez is a 72 y.o. female who presents for chronic ds management Her concerns today include:  Pt with hx of HTN, HL, DM, CKD stage 3, insomnia non-obstructing CAD (cath 08/2016, for med management), RT CTS, OA knees, gout, IDA.   HYPERTENSION/CAD/HL Currently taking: see medication list.   Reports compliance with carvedilol 25 mg BID, isosorbide 20 mg TID, hydralazine 50 mg TID.   Med Adherence: x yes    no Medication side effects:  no Adherence with salt restriction: x yes    no Home Monitoring?:  Yes   Monitoring Frequency:  1-2x/wk Home BP results range: 112/80 was last reading SOB?     no Chest Pain?:     no Leg swelling?:     no Headaches?:    no Dizziness?    no Comments: taking and tolerating Lipitor 10 mg  DM: Results for orders placed or performed in visit on 10/15/22  POCT glucose (manual entry)  Result Value Ref Range   POC Glucose 119 (A) 70 - 99 mg/dl  POCT glycosylated hemoglobin (Hb A1C)  Result Value Ref Range   Hemoglobin A1C     HbA1c POC (<> result, manual entry)     HbA1c, POC (prediabetic range)     HbA1c, POC (controlled diabetic range) 6.1 0.0 - 7.0 %  Taking and tolerating Metformin XR 500 mg 2 tabs daily Doing well with eating habits Was going to [facility] to swim in pool but pool is being fix x past 6 wks.  In the mean time she tries to walk and stay as active as she can.  If pool not fix by next wk she plans to go to another gym  CKD 3a: GFR on last visit was 55 which is stable.  Creatinine was 1.06. Last gout attack was 2 wks ago.  Notice that some foods cause flare.  Last Uric Acid 7.7.  Still not interested in Allopurinol.  Requests refill on diclofenac gel which she uses on her knees.  HM:  due for MMG Patient Active Problem List   Diagnosis Date Noted   Hypertension associated with stage 3a chronic kidney  disease due to type 2 diabetes mellitus (HCC) 10/07/2020   Hyperlipidemia associated with type 2 diabetes mellitus (HCC) 10/07/2020   Arthritis of finger of left hand 08/05/2018   Primary osteoarthritis of both knees 10/29/2017   CAD (coronary artery disease) 02/08/2017   Insomnia 11/06/2016   Iron deficiency anemia 09/14/2016   Shoulder pain, left 02/11/2016   Right carpal tunnel syndrome 07/29/2014   Vitamin D insufficiency 04/13/2014   Obesity (BMI 30-39.9) 03/13/2013   Gout 05/20/2009   HEART MURMUR, SYSTOLIC 05/06/2009   DM2 (diabetes mellitus, type 2) (HCC) 04/22/2009   Essential hypertension 04/22/2009     Current Outpatient Medications on File Prior to Visit  Medication Sig Dispense Refill   albuterol (VENTOLIN HFA) 108 (90 Base) MCG/ACT inhaler INHALE 2 PUFFS INTO THE LUNGS EVERY 6 HOURS AS NEEDED FOR WHEEZING OR SHORTNESS OF BREATH. 3 each 2   aspirin EC 81 MG tablet Take 1 tablet (81 mg total) by mouth daily. 90 tablet 3   atorvastatin (LIPITOR) 10 MG tablet Take 1 tablet (10 mg total) by mouth daily. 90 tablet 4   Blood Glucose Monitoring Suppl (ONETOUCH VERIO) w/Device KIT Use to check blood sugar  once daily. 1 kit 0   Blood Pressure KIT 1 each by Does not apply route daily. 1 each 0   carvedilol (COREG) 25 MG tablet TAKE 1 TABLET BY MOUTH 2 TIMES DAILY WITH MEALS 180 tablet 4   diclofenac Sodium (VOLTAREN) 1 % GEL APPLY 2 GRAMS TOPICALLY FOUR TIMES DAILY 700 g 0   gabapentin (NEURONTIN) 100 MG capsule TAKE 3 CAPSULES AT BEDTIME 270 capsule 3   glucose blood (TRUE METRIX BLOOD GLUCOSE TEST) test strip TEST BLOOD SUGAR EVERY DAY 100 strip 0   hydrALAZINE (APRESOLINE) 50 MG tablet Take 1 tablet (50 mg total) by mouth 3 (three) times daily. 270 tablet 2   indomethacin (INDOCIN) 50 MG capsule TAKE 1 CAPSULE TWICE DAILY AS NEEDED FOR ACUTE GOUT ATTACKS 180 capsule 0   isosorbide dinitrate (ISORDIL) 20 MG tablet TAKE 1 TABLET THREE TIMES DAILY 270 tablet 0   loratadine  (CLARITIN) 10 MG tablet Take 1 tablet (10 mg total) by mouth daily as needed for allergies. 90 tablet 2   metFORMIN (GLUCOPHAGE-XR) 500 MG 24 hr tablet TAKE 2 TABLETS EVERY DAY WITH BREAKFAST 180 tablet 0   traMADol (ULTRAM) 50 MG tablet TAKE 1 TABLET EVERY DAY AS NEEDED FOR MODERATE PAIN 30 tablet 0   Triamcinolone Acetonide (TRIAMCINOLONE 0.1 % CREAM : EUCERIN) CREA Apply 1 application topically 2 (two) times daily as needed. Please mix 1:1 (Patient taking differently: Apply 1 application  topically 2 (two) times daily as needed. Please mix 1:1) 1 each 3   triamcinolone cream (KENALOG) 0.1 % APPLY TOPICALLY TWICE DAILY AS NEEDED 60 g 3   TRUEplus Lancets 33G MISC TEST BLOOD SUGAR EVERY DAY 100 each 0   No current facility-administered medications on file prior to visit.    Allergies  Allergen Reactions   Lisinopril Swelling    REACTION: Swolen lips (only)   Hctz [Hydrochlorothiazide] Other (See Comments)    Joint pain, hyperuricemia w/ hx of gout   Naproxen Hives, Itching and Palpitations   Norvasc [Amlodipine Besylate] Other (See Comments)    Peripheral edema     Social History   Socioeconomic History   Marital status: Single    Spouse name: Not on file   Number of children: Not on file   Years of education: Not on file   Highest education level: Not on file  Occupational History   Not on file  Tobacco Use   Smoking status: Former    Types: Cigarettes    Quit date: 2009    Years since quitting: 15.3   Smokeless tobacco: Never  Substance and Sexual Activity   Alcohol use: Not Currently    Comment: occasionally   Drug use: No   Sexual activity: Not Currently  Other Topics Concern   Not on file  Social History Narrative   Not on file   Social Determinants of Health   Financial Resource Strain: Not on file  Food Insecurity: Not on file  Transportation Needs: Not on file  Physical Activity: Not on file  Stress: Not on file  Social Connections: Not on file   Intimate Partner Violence: Not on file    Family History  Problem Relation Age of Onset   Diabetes Mother    Diabetes Sister     Past Surgical History:  Procedure Laterality Date   APPENDECTOMY     CORONARY PRESSURE/FFR STUDY N/A 09/07/2016   Procedure: Intravascular Pressure Wire/FFR Study;  Surgeon: Yvonne Kendall, MD;  Location: [facility] INVASIVE CV LAB;  Service: Cardiovascular;  Laterality: N/A;   LEFT HEART CATH AND CORONARY ANGIOGRAPHY N/A 09/07/2016   Procedure: Left Heart Cath and Coronary Angiography;  Surgeon: Yvonne Kendall, MD;  Location: [facility] INVASIVE CV LAB;  Service: Cardiovascular;  Laterality: N/A;   TUBAL LIGATION      ROS: Review of Systems Negative except as stated above  PHYSICAL EXAM: BP 131/73 (BP Location: Left Arm, Patient Position: Sitting, Cuff Size: Large)   Pulse 67   Temp 97.6 F (36.4 C) (Oral)   Ht 5\' 7"  (1.702 m)   Wt 230 lb (104.3 kg)   SpO2 97%   BMI 36.02 kg/m   Wt Readings from Last 3 Encounters:  10/15/22 230 lb (104.3 kg)  06/12/22 229 lb (103.9 kg)  04/09/22 226 lb 3.2 oz (102.6 kg)    Physical Exam  General appearance - alert, well appearing, elderly African-American female and in no distress Mental status - normal mood, behavior, speech, dress, motor activity, and thought processes Neck - supple, no significant adenopathy Chest - clear to auscultation, no wheezes, rales or rhonchi, symmetric air entry Heart - normal rate, regular rhythm, normal S1, S2, no murmurs, rubs, clicks or gallops Extremities - peripheral pulses normal, no pedal edema, no clubbing or cyanosis      Latest Ref Rng & Units 06/12/2022    9:28 AM 10/22/2021    4:51 PM 10/02/2021    9:35 AM  CMP  Glucose 70 - 99 mg/dL 89  161  096   BUN 8 - 27 mg/dL 15  12  14    Creatinine 0.57 - 1.00 mg/dL 0.45  4.09  8.11   Sodium 134 - 144 mmol/L 141  138  140   Potassium 3.5 - 5.2 mmol/L 4.5  3.8  4.2   Chloride 96 - 106 mmol/L 106  109  106   CO2 20 - 29 mmol/L 18  20   20    Calcium 8.7 - 10.3 mg/dL 9.7  9.1  9.5   Total Protein 6.0 - 8.5 g/dL 7.0  7.2  7.1   Total Bilirubin 0.0 - 1.2 mg/dL 0.3  1.1  0.4   Alkaline Phos 44 - 121 IU/L 87  66  85   AST 0 - 40 IU/L 17  19  18    ALT 0 - 32 IU/L 10  13  12     Lipid Panel     Component Value Date/Time   CHOL 152 10/02/2021 0935   TRIG 124 10/02/2021 0935   HDL 52 10/02/2021 0935   CHOLHDL 2.9 10/02/2021 0935   CHOLHDL 3.5 09/05/2016 0228   VLDL 22 09/05/2016 0228   LDLCALC 78 10/02/2021 0935    CBC    Component Value Date/Time   WBC 6.7 10/22/2021 1651   RBC 4.10 10/22/2021 1651   HGB 12.1 10/22/2021 1651   HGB 11.9 10/02/2021 0935   HCT 38.3 10/22/2021 1651   HCT 35.9 10/02/2021 0935   PLT 265 10/22/2021 1651   PLT 270 10/02/2021 0935   MCV 93.4 10/22/2021 1651   MCV 88 10/02/2021 0935   MCH 29.5 10/22/2021 1651   MCHC 31.6 10/22/2021 1651   RDW 14.5 10/22/2021 1651   RDW 13.6 10/02/2021 0935   LYMPHSABS 2.6 04/22/2009 2257   MONOABS 0.6 04/22/2009 2257   EOSABS 0.1 04/22/2009 2257   BASOSABS 0.0 04/22/2009 2257    ASSESSMENT AND PLAN: 1. Type 2 diabetes mellitus with obesity (HCC) At goal. Continue metformin.  Continue to monitor GFR which  has stayed above 50.  Encouraged her to continue healthy eating habits and regular exercise. - POCT glucose (manual entry) - POCT glycosylated hemoglobin (Hb A1C) - CBC - Microalbumin / creatinine urine ratio  2. Hypertension associated with diabetes (HCC) Close to goal.  Continue hydralazine 50 mg 3 times a day, isosorbide 20 mg 3 times a day and carvedilol twice a day.  3. Hyperlipidemia associated with type 2 diabetes mellitus (HCC) Continue atorvastatin.  Due for lipid profile check. - Lipid panel  4. Coronary artery disease involving native coronary artery of native heart without angina pectoris Stable on carvedilol, isosorbide, aspirin and atorvastatin.  5. Stage 3a chronic kidney disease (HCC) Stable.  Continue to monitor.  6.  Primary osteoarthritis of both knees - diclofenac Sodium (VOLTAREN) 1 % GEL; APPLY 2 GRAMS TOPICALLY FOUR TIMES DAILY  Dispense: 700 g; Refill: 0  7. Encounter for screening mammogram for malignant neoplasm of breast - MM Digital Screening; Future  8. History of gout Uric acid level above goal.  Patient declines allopurinol.  Not a good candidate for Uloric given history of CAD.   Addendum 10/16/2022: Patient with mild anemia on CBC.  Will ask lab to check iron levels.  Patient was given the opportunity to ask questions.  Patient verbalized understanding of the plan and was able to repeat key elements of the plan.   This documentation was completed using Paediatric nurse.  Any transcriptional errors are unintentional.  Orders Placed This Encounter  Procedures   POCT glucose (manual entry)   POCT glycosylated hemoglobin (Hb A1C)     Requested Prescriptions    No prescriptions requested or ordered in this encounter    No follow-ups on file.  Jonah Blue, MD, FACP

## 2022-10-16 LAB — MICROALBUMIN / CREATININE URINE RATIO
Creatinine, Urine: 230.8 mg/dL
Microalb/Creat Ratio: 11 mg/g creat (ref 0–29)
Microalbumin, Urine: 26.1 ug/mL

## 2022-10-16 LAB — LIPID PANEL
Chol/HDL Ratio: 3.5 ratio (ref 0.0–4.4)
Cholesterol, Total: 162 mg/dL (ref 100–199)
HDL: 46 mg/dL (ref >39)
LDL Chol Calc (NIH): 92 mg/dL (ref 0–99)
Triglycerides: 133 mg/dL (ref 0–149)
VLDL Cholesterol Cal: 24 mg/dL (ref 5–40)

## 2022-10-16 LAB — CBC
Hematocrit: 33 % — ABNORMAL LOW (ref 34.0–46.6)
Hemoglobin: 10.6 g/dL — ABNORMAL LOW (ref 11.1–15.9)
MCH: 27.6 pg (ref 26.6–33.0)
MCHC: 32.1 g/dL (ref 31.5–35.7)
MCV: 86 fL (ref 79–97)
Platelets: 325 10*3/uL (ref 150–450)
RBC: 3.84 x10E6/uL (ref 3.77–5.28)
RDW: 13.7 % (ref 11.7–15.4)
WBC: 8.4 10*3/uL (ref 3.4–10.8)

## 2022-10-16 NOTE — Addendum Note (Signed)
Addended by: Jonah Blue B on: 10/16/2022 01:10 PM   Modules accepted: Orders

## 2022-10-17 LAB — IRON AND TIBC
Iron Saturation: 14 % — ABNORMAL LOW (ref 15–55)
Total Iron Binding Capacity: 297 ug/dL (ref 250–450)
UIBC: 255 ug/dL (ref 118–369)

## 2022-10-17 LAB — SPECIMEN STATUS REPORT

## 2022-10-20 ENCOUNTER — Other Ambulatory Visit: Payer: Self-pay | Admitting: Internal Medicine

## 2022-10-21 LAB — SPECIMEN STATUS REPORT

## 2022-10-22 LAB — FERRITIN: Ferritin: 27 ng/mL (ref 15–150)

## 2022-11-07 ENCOUNTER — Other Ambulatory Visit: Payer: Self-pay | Admitting: Internal Medicine

## 2022-11-07 DIAGNOSIS — I2510 Atherosclerotic heart disease of native coronary artery without angina pectoris: Secondary | ICD-10-CM

## 2022-11-07 DIAGNOSIS — I152 Hypertension secondary to endocrine disorders: Secondary | ICD-10-CM

## 2022-11-07 LAB — IRON AND TIBC: Iron: 42 ug/dL (ref 27–139)

## 2022-11-09 NOTE — Telephone Encounter (Signed)
Requested Prescriptions  Pending Prescriptions Disp Refills   atorvastatin (LIPITOR) 10 MG tablet [Pharmacy Med Name: ATORVASTATIN CALCIUM 10 MG Tablet] 90 tablet 3    Sig: TAKE 1 TABLET EVERY DAY     Cardiovascular:  Antilipid - Statins Failed - 11/07/2022 11:58 AM      Failed - Lipid Panel in normal range within the last 12 months    Cholesterol, Total  Date Value Ref Range Status  10/15/2022 162 100 - 199 mg/dL Final   LDL Chol Calc (NIH)  Date Value Ref Range Status  10/15/2022 92 0 - 99 mg/dL Final   HDL  Date Value Ref Range Status  10/15/2022 46 >39 mg/dL Final   Triglycerides  Date Value Ref Range Status  10/15/2022 133 0 - 149 mg/dL Final         Passed - Patient is not pregnant      Passed - Valid encounter within last 12 months    Recent Outpatient Visits           3 weeks ago Type 2 diabetes mellitus with obesity (HCC)   [place] [facility] Jonah Blue B, MD   3 months ago Hypertension associated with diabetes [facility])   [place] [facility] [place], [place] L, RPH-CPP   5 months ago Type 2 diabetes mellitus with obesity [facility])   [place] [facility] Marcine Matar, MD   7 months ago Encounter for [employer] annual wellness exam   [facility] [place] L, RPH-CPP   9 months ago Type 2 diabetes mellitus with obesity [facility])    [facility] Marcine Matar, MD       Future Appointments             In 3 months Laural Benes, Binnie Rail, MD [facility]

## 2022-12-11 ENCOUNTER — Other Ambulatory Visit: Payer: Self-pay | Admitting: Internal Medicine

## 2022-12-11 DIAGNOSIS — Z8739 Personal history of other diseases of the musculoskeletal system and connective tissue: Secondary | ICD-10-CM

## 2022-12-11 DIAGNOSIS — I152 Hypertension secondary to endocrine disorders: Secondary | ICD-10-CM

## 2022-12-11 DIAGNOSIS — I2510 Atherosclerotic heart disease of native coronary artery without angina pectoris: Secondary | ICD-10-CM

## 2022-12-11 NOTE — Telephone Encounter (Signed)
Requested Prescriptions  Pending Prescriptions Disp Refills   indomethacin (INDOCIN) 50 MG capsule [Pharmacy Med Name: INDOMETHACIN 50 MG Capsule] 180 capsule 0    Sig: TAKE 1 CAPSULE TWICE DAILY AS NEEDED FOR ACUTE GOUT ATTACKS     Analgesics:  NSAIDS Failed - 12/11/2022  3:31 AM      Failed - Manual Review: Labs are only required if the patient has taken medication for more than 8 weeks.      Failed - Cr in normal range and within 360 days    Creat  Date Value Ref Range Status  08/03/2016 1.19 (H) 0.50 - 0.99 mg/dL Final    Comment:      For patients > or = 74 years of age: The upper reference limit for Creatinine is approximately 13% higher for people identified as African-American.      Creatinine, Ser  Date Value Ref Range Status  06/12/2022 1.06 (H) 0.57 - 1.00 mg/dL Final   Creatinine, Urine  Date Value Ref Range Status  07/09/2015 120 20 - 320 mg/dL Final         Failed - HGB in normal range and within 360 days    Hemoglobin  Date Value Ref Range Status  10/15/2022 10.6 (L) 11.1 - 15.9 g/dL Final         Failed - HCT in normal range and within 360 days    Hematocrit  Date Value Ref Range Status  10/15/2022 33.0 (L) 34.0 - 46.6 % Final         Passed - PLT in normal range and within 360 days    Platelets  Date Value Ref Range Status  10/15/2022 325 150 - 450 x10E3/uL Final         Passed - eGFR is 30 or above and within 360 days    GFR, Est African American  Date Value Ref Range Status  08/03/2016 55 (L) >=60 mL/min Final   GFR calc Af Amer  Date Value Ref Range Status  10/09/2019 58 (L) >59 mL/min/1.73 Final    Comment:    **[facility] currently reports eGFR in compliance with the current**   recommendations of the [employer]. [facility] will   update reporting as new guidelines are published from the NKF-ASN   Task force.    GFR, Est Non African American  Date Value Ref Range Status  08/03/2016 47 (L) >=60 mL/min Final   GFR,  Estimated  Date Value Ref Range Status  10/22/2021 58 (L) >60 mL/min Final    Comment:    (NOTE) Calculated using the CKD-EPI Creatinine Equation (2021)    eGFR  Date Value Ref Range Status  06/12/2022 55 (L) >59 mL/min/1.73 Final         Passed - Patient is not pregnant      Passed - Valid encounter within last 12 months    Recent Outpatient Visits           1 month ago Type 2 diabetes mellitus with obesity [facility])   [place] [facility] Jonah Blue B, MD   4 months ago Hypertension associated with diabetes [facility])   [place] [facility] Center [place], [place] L, RPH-CPP   6 months ago Type 2 diabetes mellitus with obesity [facility])   [place] [facility] Marcine Matar, MD   8 months ago Encounter for [employer] annual wellness exam   [facility] [place]  Allyson Sabal, RPH-CPP   10 months ago Type 2 diabetes mellitus with obesity [facility])   [place] [facility] Marcine Matar, MD       Future Appointments             In 2 months Marcine Matar, MD [facility]             carvedilol (COREG) 25 MG tablet [Pharmacy Med Name: CARVEDILOL 25 MG Tablet] 180 tablet 0    Sig: TAKE 1 TABLET TWICE DAILY WITH MEALS     Cardiovascular: Beta Blockers 3 Failed - 12/11/2022  3:31 AM      Failed - Cr in normal range and within 360 days    Creat  Date Value Ref Range Status  08/03/2016 1.19 (H) 0.50 - 0.99 mg/dL Final    Comment:      For patients > or = 73 years of age: The upper reference limit for Creatinine is approximately 13% higher for people identified as African-American.      Creatinine, Ser  Date Value Ref Range Status  06/12/2022 1.06 (H) 0.57 - 1.00 mg/dL Final   Creatinine, Urine  Date Value Ref Range Status  07/09/2015 120 20 - 320 mg/dL Final         Passed - AST in normal  range and within 360 days    AST  Date Value Ref Range Status  06/12/2022 17 0 - 40 IU/L Final         Passed - ALT in normal range and within 360 days    ALT  Date Value Ref Range Status  06/12/2022 10 0 - 32 IU/L Final         Passed - Last BP in normal range    BP Readings from Last 1 Encounters:  10/15/22 131/73         Passed - Last Heart Rate in normal range    Pulse Readings from Last 1 Encounters:  10/15/22 67         Passed - Valid encounter within last 6 months    Recent Outpatient Visits           1 month ago Type 2 diabetes mellitus with obesity (HCC)   [place] [facility] Jonah Blue B, MD   4 months ago Hypertension associated with diabetes [facility])   [place] [facility] [place], [place] L, RPH-CPP   6 months ago Type 2 diabetes mellitus with obesity [facility])   [place] [facility] Marcine Matar, MD   8 months ago Encounter for [employer] annual wellness exam   [facility] Center [place], [place] L, RPH-CPP   10 months ago Type 2 diabetes mellitus with obesity [facility])   [place] [facility] Marcine Matar, MD       Future Appointments             In 2 months Laural Benes, Binnie Rail, MD [facility]

## 2022-12-24 ENCOUNTER — Other Ambulatory Visit: Payer: Self-pay | Admitting: Internal Medicine

## 2022-12-24 DIAGNOSIS — I2510 Atherosclerotic heart disease of native coronary artery without angina pectoris: Secondary | ICD-10-CM

## 2022-12-24 DIAGNOSIS — E669 Obesity, unspecified: Secondary | ICD-10-CM

## 2023-01-13 ENCOUNTER — Other Ambulatory Visit: Payer: Self-pay | Admitting: Internal Medicine

## 2023-01-13 DIAGNOSIS — I2510 Atherosclerotic heart disease of native coronary artery without angina pectoris: Secondary | ICD-10-CM

## 2023-01-13 DIAGNOSIS — E1159 Type 2 diabetes mellitus with other circulatory complications: Secondary | ICD-10-CM

## 2023-01-13 DIAGNOSIS — M170 Bilateral primary osteoarthritis of knee: Secondary | ICD-10-CM

## 2023-01-13 DIAGNOSIS — Z8739 Personal history of other diseases of the musculoskeletal system and connective tissue: Secondary | ICD-10-CM

## 2023-01-13 DIAGNOSIS — I152 Hypertension secondary to endocrine disorders: Secondary | ICD-10-CM

## 2023-01-19 ENCOUNTER — Ambulatory Visit: Payer: Medicare HMO | Attending: Internal Medicine

## 2023-01-19 VITALS — Ht 67.0 in | Wt 230.0 lb

## 2023-01-19 DIAGNOSIS — Z0000 Encounter for general adult medical examination without abnormal findings: Secondary | ICD-10-CM | POA: Diagnosis not present

## 2023-01-19 NOTE — Patient Instructions (Addendum)
Desiree Sanchez , Thank you for taking time to come for your Medicare Wellness Visit. I appreciate your ongoing commitment to your health goals. Please review the following plan we discussed and let me know if I can assist you in the future.   Referrals/Orders/Follow-Ups/Clinician Recommendations: Aim for 30 minutes of exercise or brisk walking, 6-8 glasses of water, and 5 servings of fruits and vegetables each day.  You have an order for:  []   2D Mammogram  [x]   3D Mammogram  []   Bone Density     Please call for appointment:   [facility] [address] [place], [place] [address] [phone]  Make sure to wear two-piece clothing.  No lotions, powders, or deodorants the day of the appointment. Make sure to bring picture ID and insurance card.  Bring list of medications you are currently taking including any supplements.   Schedule your [place] screening mammogram through MyChart!   Log into your MyChart account.  Go to 'Visit' (or 'Appointments' if on mobile App) --> Schedule an Appointment  Under 'Select a Reason for Visit' choose the Mammogram Screening option.  Complete the pre-visit questions and select the time and place that best fits your schedule.    This is a list of the screening recommended for you and due dates:  Health Maintenance  Topic Date Due   COVID-19 Vaccine (6 - 2023-24 season) 06/08/2022   Mammogram  06/13/2022   Flu Shot  01/07/2023   Eye exam for diabetics  03/11/2023   Medicare Annual Wellness Visit  04/10/2023   Hemoglobin A1C  04/17/2023   Yearly kidney function blood test for diabetes  06/13/2023   Complete foot exam   06/13/2023   Yearly kidney health urinalysis for diabetes  10/15/2023   Colon Cancer Screening  11/14/2023   DTaP/Tdap/Td vaccine (3 - Td or Tdap) 01/21/2030   Pneumonia Vaccine  Completed   DEXA scan (bone density measurement)  Completed   Hepatitis C Screening  Completed   Zoster (Shingles) Vaccine   Completed   HPV Vaccine  Aged Out    Advanced directives: (ACP Link)Information on Advanced Care Planning can be found at [facility] [place] Advance Health Care Directives Advance Health Care Directives ([email or web address]   Next Medicare Annual Wellness Visit scheduled for next year: Yes  Preventive Care 65 Years and Older, Female Preventive care refers to lifestyle choices and visits with your health care provider that can promote health and wellness. What does preventive care include? A yearly physical exam. This is also called an annual well check. Dental exams once or twice a year. Routine eye exams. Ask your health care provider how often you should have your eyes checked. Personal lifestyle choices, including: Daily care of your teeth and gums. Regular physical activity. Eating a healthy diet. Avoiding tobacco and drug use. Limiting alcohol use. Practicing safe sex. Taking low-dose aspirin every day. Taking vitamin and mineral supplements as recommended by your health care provider. What happens during an annual well check? The services and screenings done by your health care provider during your annual well check will depend on your age, overall health, lifestyle risk factors, and family history of disease. Counseling  Your health care provider may ask you questions about your: Alcohol use. Tobacco use. Drug use. Emotional well-being. Home and relationship well-being. Sexual activity. Eating habits. History of falls. Memory and ability to understand (cognition). Work and work Astronomer. Reproductive health. Screening  You may have the following  tests or measurements: Height, weight, and BMI. Blood pressure. Lipid and cholesterol levels. These may be checked every 5 years, or more frequently if you are over 7 years old. Skin check. Lung cancer screening. You may have this screening every year starting at age 51 if you have a 30-pack-year history of smoking  and currently smoke or have quit within the past 15 years. Fecal occult blood test (FOBT) of the stool. You may have this test every year starting at age 65. Flexible sigmoidoscopy or colonoscopy. You may have a sigmoidoscopy every 5 years or a colonoscopy every 10 years starting at age 19. Hepatitis C blood test. Hepatitis B blood test. Sexually transmitted disease (STD) testing. Diabetes screening. This is done by checking your blood sugar (glucose) after you have not eaten for a while (fasting). You may have this done every 1-3 years. Bone density scan. This is done to screen for osteoporosis. You may have this done starting at age 77. Mammogram. This may be done every 1-2 years. Talk to your health care provider about how often you should have regular mammograms. Talk with your health care provider about your test results, treatment options, and if necessary, the need for more tests. Vaccines  Your health care provider may recommend certain vaccines, such as: Influenza vaccine. This is recommended every year. Tetanus, diphtheria, and acellular pertussis (Tdap, Td) vaccine. You may need a Td booster every 10 years. Zoster vaccine. You may need this after age 58. Pneumococcal 13-valent conjugate (PCV13) vaccine. One dose is recommended after age 47. Pneumococcal polysaccharide (PPSV23) vaccine. One dose is recommended after age 44. Talk to your health care provider about which screenings and vaccines you need and how often you need them. This information is not intended to replace advice given to you by your health care provider. Make sure you discuss any questions you have with your health care provider. Document Released: 06/21/2015 Document Revised: 02/12/2016 Document Reviewed: 03/26/2015 Elsevier Interactive Patient Education  2017 [employer].  Fall Prevention in the Home Falls can cause injuries. They can happen to people of all ages. There are many things you can do to make your  home safe and to help prevent falls. What can I do on the outside of my home? Regularly fix the edges of walkways and driveways and fix any cracks. Remove anything that might make you trip as you walk through a door, such as a raised step or threshold. Trim any bushes or trees on the path to your home. Use bright outdoor lighting. Clear any walking paths of anything that might make someone trip, such as rocks or tools. Regularly check to see if handrails are loose or broken. Make sure that both sides of any steps have handrails. Any raised decks and porches should have guardrails on the edges. Have any leaves, snow, or ice cleared regularly. Use sand or salt on walking paths during winter. Clean up any spills in your garage right away. This includes oil or grease spills. What can I do in the bathroom? Use night lights. Install grab bars by the toilet and in the tub and shower. Do not use towel bars as grab bars. Use non-skid mats or decals in the tub or shower. If you need to sit down in the shower, use a plastic, non-slip stool. Keep the floor dry. Clean up any water that spills on the floor as soon as it happens. Remove soap buildup in the tub or shower regularly. Attach bath mats securely with  double-sided non-slip rug tape. Do not have throw rugs and other things on the floor that can make you trip. What can I do in the bedroom? Use night lights. Make sure that you have a light by your bed that is easy to reach. Do not use any sheets or blankets that are too big for your bed. They should not hang down onto the floor. Have a firm chair that has side arms. You can use this for support while you get dressed. Do not have throw rugs and other things on the floor that can make you trip. What can I do in the kitchen? Clean up any spills right away. Avoid walking on wet floors. Keep items that you use a lot in easy-to-reach places. If you need to reach something above you, use a strong  step stool that has a grab bar. Keep electrical cords out of the way. Do not use floor polish or wax that makes floors slippery. If you must use wax, use non-skid floor wax. Do not have throw rugs and other things on the floor that can make you trip. What can I do with my stairs? Do not leave any items on the stairs. Make sure that there are handrails on both sides of the stairs and use them. Fix handrails that are broken or loose. Make sure that handrails are as long as the stairways. Check any carpeting to make sure that it is firmly attached to the stairs. Fix any carpet that is loose or worn. Avoid having throw rugs at the top or bottom of the stairs. If you do have throw rugs, attach them to the floor with carpet tape. Make sure that you have a light switch at the top of the stairs and the bottom of the stairs. If you do not have them, ask someone to add them for you. What else can I do to help prevent falls? Wear shoes that: Do not have high heels. Have rubber bottoms. Are comfortable and fit you well. Are closed at the toe. Do not wear sandals. If you use a stepladder: Make sure that it is fully opened. Do not climb a closed stepladder. Make sure that both sides of the stepladder are locked into place. Ask someone to hold it for you, if possible. Clearly mark and make sure that you can see: Any grab bars or handrails. First and last steps. Where the edge of each step is. Use tools that help you move around (mobility aids) if they are needed. These include: Canes. Walkers. Scooters. Crutches. Turn on the lights when you go into a dark area. Replace any light bulbs as soon as they burn out. Set up your furniture so you have a clear path. Avoid moving your furniture around. If any of your floors are uneven, fix them. If there are any pets around you, be aware of where they are. Review your medicines with your doctor. Some medicines can make you feel dizzy. This can increase your  chance of falling. Ask your doctor what other things that you can do to help prevent falls. This information is not intended to replace advice given to you by your health care provider. Make sure you discuss any questions you have with your health care provider. Document Released: 03/21/2009 Document Revised: 10/31/2015 Document Reviewed: 06/29/2014 Elsevier Interactive Patient Education  2017 [employer].

## 2023-01-19 NOTE — Progress Notes (Signed)
Subjective:   Desiree Sanchez is a 72 y.o. female who presents for Medicare Annual (Subsequent) preventive examination.  Visit Complete: Virtual  I connected with  Desiree Sanchez on 01/19/23 by a video and audio enabled telemedicine application and verified that I am speaking with the correct person using two identifiers.  Patient Location: Home  Provider Location: Home Office  I discussed the limitations of evaluation and management by telemedicine. The patient expressed understanding and agreed to proceed.  Vital Signs: Unable to obtain new vitals due to this being a telehealth visit.  Review of Systems     Cardiac Risk Factors include: advanced age (>93men, >62 women);diabetes mellitus;dyslipidemia;hypertension;sedentary lifestyle     Objective:    Today's Vitals   01/19/23 2018  Weight: 230 lb (104.3 kg)  Height: 5\' 7"  (1.702 m)   Body mass index is 36.02 kg/m.     01/19/2023    8:32 PM 04/09/2022    9:00 AM 10/22/2021    3:37 PM 04/08/2021    3:24 PM 01/22/2020   10:13 AM 02/16/2017    1:39 PM 11/06/2016   10:11 AM  Advanced Directives  Does Patient Have a Medical Advance Directive? No No Yes No Yes No No  Type of Best boy of [place];Living will  Healthcare Power of Attorney    Does patient want to make changes to medical advance directive?   No - Patient declined      Copy of Healthcare Power of Attorney in Chart?   No - copy available, Physician notified  No - copy requested    Would patient like information on creating a medical advance directive? Yes (MAU/Ambulatory/Procedural Areas - Information given) No - Patient declined  No - Patient declined  No - Patient declined     Current Medications (verified) Outpatient Encounter Medications as of 01/19/2023  Medication Sig   albuterol (VENTOLIN HFA) 108 (90 Base) MCG/ACT inhaler INHALE 2 PUFFS INTO THE LUNGS EVERY 6 HOURS AS NEEDED FOR WHEEZING OR SHORTNESS OF BREATH.   aspirin EC 81 MG  tablet Take 1 tablet (81 mg total) by mouth daily.   atorvastatin (LIPITOR) 10 MG tablet TAKE 1 TABLET EVERY DAY   Blood Glucose Monitoring Suppl (ONETOUCH VERIO) w/Device KIT Use to check blood sugar once daily.   Blood Pressure KIT 1 each by Does not apply route daily.   carvedilol (COREG) 25 MG tablet TAKE 1 TABLET TWICE DAILY WITH MEALS   diclofenac Sodium (VOLTAREN) 1 % GEL APPLY 2 GRAMS TOPICALLY FOUR TIMES DAILY   gabapentin (NEURONTIN) 100 MG capsule TAKE 3 CAPSULES AT BEDTIME   hydrALAZINE (APRESOLINE) 50 MG tablet Take 1 tablet (50 mg total) by mouth 3 (three) times daily.   indomethacin (INDOCIN) 50 MG capsule TAKE 1 CAPSULE TWICE DAILY AS NEEDED FOR ACUTE GOUT ATTACKS   isosorbide dinitrate (ISORDIL) 20 MG tablet Take 1 tablet (20 mg total) by mouth 3 (three) times daily.   loratadine (CLARITIN) 10 MG tablet Take 1 tablet (10 mg total) by mouth daily as needed for allergies.   metFORMIN (GLUCOPHAGE-XR) 500 MG 24 hr tablet Take 2 tablets (1,000 mg total) by mouth daily with breakfast.   traMADol (ULTRAM) 50 MG tablet Take 1 tablet (50 mg total) by mouth daily as needed.   Triamcinolone Acetonide (TRIAMCINOLONE 0.1 % CREAM : EUCERIN) CREA Apply 1 application topically 2 (two) times daily as needed. Please mix 1:1 (Patient taking differently: Apply 1 application  topically 2 (two)  times daily as needed. Please mix 1:1)   triamcinolone cream (KENALOG) 0.1 % APPLY TOPICALLY TWICE DAILY AS NEEDED   TRUE METRIX BLOOD GLUCOSE TEST test strip TEST BLOOD SUGAR EVERY DAY   TRUEplus Lancets 33G MISC TEST BLOOD SUGAR EVERY DAY   No facility-administered encounter medications on file as of 01/19/2023.    Allergies (verified) Lisinopril, Hctz [hydrochlorothiazide], Naproxen, and Norvasc [amlodipine besylate]   History: Past Medical History:  Diagnosis Date   Arthritis Dx 2006   Carpal tunnel syndrome    Diabetes mellitus without complication (HCC) Dx 2005   Dyspnea    Gout    Heart murmur     Hypertension Dx 2005   Unstable angina (HCC) 08/2016   Past Surgical History:  Procedure Laterality Date   APPENDECTOMY     CORONARY PRESSURE/FFR STUDY N/A 09/07/2016   Procedure: Intravascular Pressure Wire/FFR Study;  Surgeon: Yvonne Kendall, MD;  Location[facility] INVASIVE CV LAB;  Service: Cardiovascular;  Laterality: N/A;   LEFT HEART CATH AND CORONARY ANGIOGRAPHY N/A 09/07/2016   Procedure: Left Heart Cath and Coronary Angiography;  Surgeon: Yvonne Kendall, MD;  Location: [facility] INVASIVE CV LAB;  Service: Cardiovascular;  Laterality: N/A;   TUBAL LIGATION     Family History  Problem Relation Age of Onset   Diabetes Mother    Diabetes Sister    Social History   Socioeconomic History   Marital status: Single    Spouse name: Not on file   Number of children: Not on file   Years of education: Not on file   Highest education level: Not on file  Occupational History   Not on file  Tobacco Use   Smoking status: Former    Current packs/day: 0.00    Types: Cigarettes    Quit date: 2009    Years since quitting: 15.6   Smokeless tobacco: Never  Substance and Sexual Activity   Alcohol use: Not Currently    Comment: occasionally   Drug use: No   Sexual activity: Not Currently  Other Topics Concern   Not on file  Social History Narrative   Not on file   Social Determinants of Health   Financial Resource Strain: Low Risk  (01/19/2023)   Overall Financial Resource Strain (CARDIA)    Difficulty of Paying Living Expenses: Not hard at all  Food Insecurity: No Food Insecurity (01/19/2023)   Hunger Vital Sign    Worried About Running Out of Food in the Last Year: Never true    Ran Out of Food in the Last Year: Never true  Transportation Needs: No Transportation Needs (01/19/2023)   PRAPARE - Administrator, Civil Service (Medical): No    Lack of Transportation (Non-Medical): No  Physical Activity: Insufficiently Active (01/19/2023)   Exercise Vital Sign    Days of Exercise  per Week: 3 days    Minutes of Exercise per Session: 30 min  Stress: No Stress Concern Present (01/19/2023)   [employer] of Occupational Health - Occupational Stress Questionnaire    Feeling of Stress : Not at all  Social Connections: Moderately Isolated (01/19/2023)   Social Connection and Isolation Panel [NHANES]    Frequency of Communication with Friends and Family: More than three times a week    Frequency of Social Gatherings with Friends and Family: Three times a week    Attends Religious Services: More than 4 times per year    Active Member of Clubs or Organizations: No    Attends Club or  Organization Meetings: Never    Marital Status: Never married    Tobacco Counseling Counseling given: Not Answered   Clinical Intake:  Pre-visit preparation completed: Yes  Pain : No/denies pain     Diabetes: Yes CBG done?: No Did pt. bring in CBG monitor from home?: No  How often do you need to have someone help you when you read instructions, pamphlets, or other written materials from your doctor or pharmacy?: 1 - Never  Interpreter Needed?: No  Information entered by :: Kandis Fantasia LPN   Activities of Daily Living    01/19/2023    8:27 PM 04/09/2022    9:02 AM  In your present state of health, do you have any difficulty performing the following activities:  Hearing? 0 0  Vision? 0 0  Difficulty concentrating or making decisions? 0 0  Walking or climbing stairs? 0 0  Dressing or bathing? 0 0  Doing errands, shopping? 0 0  Preparing Food and eating ? N N  Using the Toilet? N N  In the past six months, have you accidently leaked urine? N N  Do you have problems with loss of bowel control? N N  Managing your Medications? N N  Managing your Finances? N N  Housekeeping or managing your Housekeeping? N N    Patient Care Team: Marcine Matar, MD as PCP - General (Internal Medicine) [place], Peter M, MD as PCP - Cardiology (Cardiology)  Indicate any recent  Medical Services you may have received from other than [facility] providers in the past year (date may be approximate).     Assessment:   This is a routine wellness examination for [place].  Hearing/Vision screen Hearing Screening - Comments:: Denies hearing difficulties   Vision Screening - Comments:: Wears rx glasses - up to date with routine eye exams with Dr. Jimmye Norman    Dietary issues and exercise activities discussed:     Goals Addressed             This Visit's Progress    Remain active and independent        Depression Screen    01/19/2023    8:23 PM 10/15/2022    8:48 AM 06/12/2022    8:42 AM 04/09/2022    9:01 AM 06/03/2021   10:19 AM 04/08/2021    3:24 PM 10/07/2020    8:38 AM  PHQ 2/9 Scores  PHQ - 2 Score 0 0 0 0 0 0 0  PHQ- 9 Score  0 0  0      Fall Risk    01/19/2023    8:27 PM 10/15/2022    8:41 AM 06/12/2022    8:34 AM 04/09/2022    9:01 AM 02/02/2022    8:35 AM  Fall Risk   Falls in the past year? 0 0 0 0 0  Number falls in past yr: 0 0 0 0 0  Injury with Fall? 0 0 0 0 0  Risk for fall due to : No Fall Risks No Fall Risks No Fall Risks No Fall Risks No Fall Risks  Follow up Falls prevention discussed;Education provided;Falls evaluation completed   Education provided Falls evaluation completed    MEDICARE RISK AT HOME:  Medicare Risk at Home - 01/19/23 2027     Any stairs in or around the home? No    If so, are there any without handrails? No    Home free of loose throw rugs in walkways, pet beds, electrical cords, etc? Yes  Adequate lighting in your home to reduce risk of falls? Yes    Life alert? No    Use of a cane, walker or w/c? No    Grab bars in the bathroom? Yes    Shower chair or bench in shower? No    Elevated toilet seat or a handicapped toilet? No             TIMED UP AND GO:  Was the test performed?  No    Cognitive Function:    04/09/2022    9:04 AM 04/08/2021    3:25 PM 01/22/2020   10:14 AM  MMSE - Mini Mental State  Exam  Orientation to time 5 5 5   Orientation to Place 5 5 5   Registration 3 3 3   Attention/ Calculation 5 5 5   Recall 3 3 3   Language- name 2 objects 2 2 2   Language- repeat 1 1 1   Language- follow 3 step command 3 3 3   Language- read & follow direction 1 1 1   Write a sentence 1 1 1   Copy design 1 0 0  Total score 30 29 29         01/19/2023    8:27 PM  6CIT Screen  What Year? 0 points  What month? 0 points  What time? 0 points  Count back from 20 0 points  Months in reverse 0 points  Repeat phrase 0 points  Total Score 0 points    Immunizations Immunization History  Administered Date(s) Administered   Fluad Quad(high Dose 65+) 04/08/2021   Influenza Split 03/13/2013   Influenza Whole 04/22/2009   Influenza,inj,Quad PF,6+ Mos 07/09/2015, 02/11/2016, 02/16/2017, 03/25/2018, 02/09/2019, 03/20/2020, 02/02/2022   PFIZER(Purple Top)SARS-COV-2 Vaccination 07/13/2019, 08/03/2019, 04/09/2020, 09/30/2020   Pfizer Covid-19 Vaccine Bivalent Booster 51yrs & up 04/13/2022   Pneumococcal Conjugate-13 02/16/2017   Pneumococcal Polysaccharide-23 04/22/2009, 01/03/2015   Respiratory Syncytial Virus Vaccine,Recomb Aduvanted(Arexvy) 06/12/2022   Td 04/22/2009   Tdap 01/22/2020   Zoster Recombinant(Shingrix) 02/11/2022, 04/13/2022    TDAP status: Up to date  Flu Vaccine status: Due, Education has been provided regarding the importance of this vaccine. Advised may receive this vaccine at local pharmacy or Health Dept. Aware to provide a copy of the vaccination record if obtained from local pharmacy or Health Dept. Verbalized acceptance and understanding.  Pneumococcal vaccine status: Up to date  Covid-19 vaccine status: Information provided on how to obtain vaccines.   Qualifies for Shingles Vaccine? Yes   Zostavax completed No   Shingrix Completed?: Yes  Screening Tests Health Maintenance  Topic Date Due   COVID-19 Vaccine (6 - 2023-24 season) 06/08/2022   MAMMOGRAM  06/13/2022    INFLUENZA VACCINE  01/07/2023   OPHTHALMOLOGY EXAM  03/11/2023   HEMOGLOBIN A1C  04/17/2023   Diabetic kidney evaluation - eGFR measurement  06/13/2023   FOOT EXAM  06/13/2023   Diabetic kidney evaluation - Urine ACR  10/15/2023   Colonoscopy  11/14/2023   Medicare Annual Wellness (AWV)  01/19/2024   DTaP/Tdap/Td (3 - Td or Tdap) 01/21/2030   Pneumonia Vaccine 7+ Years old  Completed   DEXA SCAN  Completed   Hepatitis C Screening  Completed   Zoster Vaccines- Shingrix  Completed   HPV VACCINES  Aged Out    Health Maintenance  Health Maintenance Due  Topic Date Due   COVID-19 Vaccine (6 - 2023-24 season) 06/08/2022   MAMMOGRAM  06/13/2022   INFLUENZA VACCINE  01/07/2023    Colorectal cancer screening: Type of screening: Colonoscopy.  Completed 11/13/13. Repeat every 10 years  Mammogram status: Ordered 10/15/22. Pt provided with contact info and advised to call to schedule appt.   Bone Density status: Completed 06/13/20. Results reflect: Bone density results: OSTEOPENIA. Repeat every 2 years.  Lung Cancer Screening: (Low Dose CT Chest recommended if Age 18-80 years, 20 pack-year currently smoking OR have quit w/in 15years.) does not qualify.   Lung Cancer Screening Referral: n/a  Additional Screening:  Hepatitis C Screening: does qualify; Completed 07/09/15  Vision Screening: Recommended annual ophthalmology exams for early detection of glaucoma and other disorders of the eye. Is the patient up to date with their annual eye exam?  Yes  Who is the provider or what is the name of the office in which the patient attends annual eye exams? Dr. Blima Ledger If pt is not established with a provider, would they like to be referred to a provider to establish care? No .   Dental Screening: Recommended annual dental exams for proper oral hygiene  Diabetic Foot Exam: Diabetic Foot Exam: Completed 06/12/22  Community Resource Referral / Chronic Care Management: CRR required this visit?   No   CCM required this visit?  No     Plan:     I have personally reviewed and noted the following in the patient's chart:   Medical and social history Use of alcohol, tobacco or illicit drugs  Current medications and supplements including opioid prescriptions. Patient is not currently taking opioid prescriptions. Functional ability and status Nutritional status Physical activity Advanced directives List of other physicians Hospitalizations, surgeries, and ER visits in previous 12 months Vitals Screenings to include cognitive, depression, and falls Referrals and appointments  In addition, I have reviewed and discussed with patient certain preventive protocols, quality metrics, and best practice recommendations. A written personalized care plan for preventive services as well as general preventive health recommendations were provided to patient.     Kandis Fantasia [place], [place]   [id number]   After Visit Summary: (MyChart) Due to this being a telephonic visit, the after visit summary with patients personalized plan was offered to patient via MyChart   Nurse Notes: No concerns at this time

## 2023-02-19 ENCOUNTER — Ambulatory Visit: Payer: Medicare HMO | Admitting: Internal Medicine

## 2023-03-06 ENCOUNTER — Other Ambulatory Visit: Payer: Self-pay | Admitting: Internal Medicine

## 2023-03-06 DIAGNOSIS — L299 Pruritus, unspecified: Secondary | ICD-10-CM

## 2023-03-08 NOTE — Telephone Encounter (Signed)
Requested medication (s) are due for refill today- yes  Requested medication (s) are on the active medication list -yes  Future visit scheduled -no  Last refill: 06/30/22 60g 3RF  Notes to clinic: non delegated Rx  Requested Prescriptions  Pending Prescriptions Disp Refills   triamcinolone cream (KENALOG) 0.1 % [Pharmacy Med Name: Triamcinolone Acetonide External Cream 0.1 %] 60 g 5    Sig: APPLY TOPICALLY TWICE DAILY AS NEEDED     Not Delegated - Dermatology:  Corticosteroids Failed - 03/06/2023  2:52 AM      Failed - This refill cannot be delegated      Passed - Valid encounter within last 12 months    Recent Outpatient Visits           4 months ago Type 2 diabetes mellitus with obesity (HCC)   [facility] Jonah Blue B, MD   7 months ago Hypertension associated with diabetes [facility])   [facility] [place], [place] L, RPH-CPP   8 months ago Type 2 diabetes mellitus with obesity [facility])   [facility] Marcine Matar, MD   11 months ago Encounter for [employer] annual wellness exam   [facility] [place], [employer] L, RPH-CPP   1 year ago Type 2 diabetes mellitus with obesity [facility])   [place] [facility] Marcine Matar, MD                 Requested Prescriptions  Pending Prescriptions Disp Refills   triamcinolone cream (KENALOG) 0.1 % [Pharmacy Med Name: Triamcinolone Acetonide External Cream 0.1 %] 60 g 5    Sig: APPLY TOPICALLY TWICE DAILY AS NEEDED     Not Delegated - Dermatology:  Corticosteroids Failed - 03/06/2023  2:52 AM      Failed - This refill cannot be delegated      Passed - Valid encounter within last 12 months    Recent Outpatient Visits           4 months ago Type 2 diabetes mellitus with obesity (HCC)   [facility] Jonah Blue B, MD    7 months ago Hypertension associated with diabetes [facility])   [facility] [place], [place] L, RPH-CPP   8 months ago Type 2 diabetes mellitus with obesity [facility])   [place] [facility] Marcine Matar, MD   11 months ago Encounter for [employer] annual wellness exam   [facility] [place], [place] L, RPH-CPP   1 year ago Type 2 diabetes mellitus with obesity [facility])   [facility] & [facility] Marcine Matar, MD

## 2023-03-24 ENCOUNTER — Other Ambulatory Visit: Payer: Self-pay | Admitting: Pharmacist

## 2023-03-24 NOTE — Progress Notes (Signed)
Pharmacy Quality Measure Review  This patient is appearing on a report for being at risk of failing the adherence measure for diabetes and cholesterol (statin) medications this calendar year.   Medication: metformin Last fill date: 03/08/2023 for 90 day supply  Insurance report was not up to date. No action needed at this time.   Medication: atorvastatin Last fill date: 11/09/2022 for 90 day supply.   Contacted pharmacy to facilitate refills. Absolute fail date is 04/13/23. Call placed to [facility] pharmacy. $0 for a 90-ds authorized and is in pre-delivery.    Butch Penny, PharmD, P[place]y [place], CPP Clinical Pharmacist [facility] [phone]

## 2023-03-31 ENCOUNTER — Other Ambulatory Visit: Payer: Self-pay | Admitting: Internal Medicine

## 2023-04-24 ENCOUNTER — Other Ambulatory Visit: Payer: Self-pay | Admitting: Internal Medicine

## 2023-04-24 DIAGNOSIS — I152 Hypertension secondary to endocrine disorders: Secondary | ICD-10-CM

## 2023-04-26 NOTE — Telephone Encounter (Signed)
Requested Prescriptions  Refused Prescriptions Disp Refills   hydrALAZINE (APRESOLINE) 50 MG tablet [Pharmacy Med Name: hydrALAZINE HCl Oral Tablet 50 MG] 270 tablet 3    Sig: TAKE 1 TABLET THREE TIMES DAILY     Cardiovascular:  Vasodilators Failed - 04/24/2023  2:54 AM      Failed - HCT in normal range and within 360 days    Hematocrit  Date Value Ref Range Status  10/15/2022 33.0 (L) 34.0 - 46.6 % Final         Failed - HGB in normal range and within 360 days    Hemoglobin  Date Value Ref Range Status  10/15/2022 10.6 (L) 11.1 - 15.9 g/dL Final         Failed - ANA Screen, Ifa, Serum in normal range and within 360 days    No results found for: "ANA", "ANATITER", "LABANTI"       Passed - RBC in normal range and within 360 days    RBC  Date Value Ref Range Status  10/15/2022 3.84 3.77 - 5.28 x10E6/uL Final  10/22/2021 4.10 3.87 - 5.11 MIL/uL Final         Passed - WBC in normal range and within 360 days    WBC  Date Value Ref Range Status  10/15/2022 8.4 3.4 - 10.8 x10E3/uL Final  10/22/2021 6.7 4.0 - 10.5 K/uL Final         Passed - PLT in normal range and within 360 days    Platelets  Date Value Ref Range Status  10/15/2022 325 150 - 450 x10E3/uL Final         Passed - Last BP in normal range    BP Readings from Last 1 Encounters:  10/15/22 131/73         Passed - Valid encounter within last 12 months    Recent Outpatient Visits           6 months ago Type 2 diabetes mellitus with obesity (HCC)   [facility] [place] [place]. [facility] Marcine Matar, MD   9 months ago Hypertension associated with diabetes [facility])   [facility] [place]. [facility] Lois Huxley, [place] L, RPH-CPP   10 months ago Type 2 diabetes mellitus with obesity [facility])   [place] [facility] [place]. [facility] Marcine Matar, MD   1 year ago Encounter for [employer]  annual wellness exam   [facility] [facility] [place]. [facility] Yehuda [place] L, RPH-CPP   1 year ago Type 2 diabetes mellitus with obesity [facility])   [place] [facility] [place]. [facility] Marcine Matar, MD       Future Appointments             In 3 weeks Marcine Matar, MD [facility] [place] - A Dept Of [place]igha Bridegroom. [facility]

## 2023-05-20 ENCOUNTER — Other Ambulatory Visit: Payer: Self-pay | Admitting: Internal Medicine

## 2023-05-20 DIAGNOSIS — I2510 Atherosclerotic heart disease of native coronary artery without angina pectoris: Secondary | ICD-10-CM

## 2023-05-20 DIAGNOSIS — E1169 Type 2 diabetes mellitus with other specified complication: Secondary | ICD-10-CM

## 2023-05-21 ENCOUNTER — Ambulatory Visit: Payer: Medicare HMO | Attending: Internal Medicine | Admitting: Internal Medicine

## 2023-05-21 ENCOUNTER — Encounter: Payer: Self-pay | Admitting: Internal Medicine

## 2023-05-21 DIAGNOSIS — I152 Hypertension secondary to endocrine disorders: Secondary | ICD-10-CM

## 2023-05-21 DIAGNOSIS — E785 Hyperlipidemia, unspecified: Secondary | ICD-10-CM

## 2023-05-21 DIAGNOSIS — E119 Type 2 diabetes mellitus without complications: Secondary | ICD-10-CM

## 2023-05-21 DIAGNOSIS — N1831 Chronic kidney disease, stage 3a: Secondary | ICD-10-CM

## 2023-05-21 DIAGNOSIS — E669 Obesity, unspecified: Secondary | ICD-10-CM | POA: Diagnosis not present

## 2023-05-21 DIAGNOSIS — E1169 Type 2 diabetes mellitus with other specified complication: Secondary | ICD-10-CM

## 2023-05-21 DIAGNOSIS — E66813 Obesity, class 3: Secondary | ICD-10-CM | POA: Diagnosis not present

## 2023-05-21 DIAGNOSIS — Z1231 Encounter for screening mammogram for malignant neoplasm of breast: Secondary | ICD-10-CM

## 2023-05-21 DIAGNOSIS — D508 Other iron deficiency anemias: Secondary | ICD-10-CM | POA: Diagnosis not present

## 2023-05-21 DIAGNOSIS — E1159 Type 2 diabetes mellitus with other circulatory complications: Secondary | ICD-10-CM

## 2023-05-21 DIAGNOSIS — I2510 Atherosclerotic heart disease of native coronary artery without angina pectoris: Secondary | ICD-10-CM

## 2023-05-21 DIAGNOSIS — Z6834 Body mass index (BMI) 34.0-34.9, adult: Secondary | ICD-10-CM

## 2023-05-21 DIAGNOSIS — Z7984 Long term (current) use of oral hypoglycemic drugs: Secondary | ICD-10-CM

## 2023-05-21 LAB — GLUCOSE, POCT (MANUAL RESULT ENTRY): POC Glucose: 112 mg/dL — AB (ref 70–99)

## 2023-05-21 LAB — POCT GLYCOSYLATED HEMOGLOBIN (HGB A1C): HbA1c, POC (controlled diabetic range): 6.5 % (ref 0.0–7.0)

## 2023-05-21 NOTE — Progress Notes (Signed)
Patient ID: Desiree Sanchez, female    DOB: 02/05/1951  MRN: [id number]  CC: Diabetes (DM f/u./No questions / concerns/Already received flu vax. Yes to mammogram)   Subjective: Desiree Sanchez is a 72 y.o. female who presents for chronic ds management. Her concerns today include:  Pt with hx of HTN, HL, DM, CKD stage 3, insomnia non-obstructing CAD (cath 08/2016, for med management), RT CTS, OA knees, gout, IDA.   Discussed the use of AI scribe software for clinical note transcription with the patient, who gave verbal consent to proceed.  History of Present Illness   The patient, with a history of diabetes, hypertension, hyperlipidemia, and anemia, presents for a routine follow-up.  DM Results for orders placed or performed in visit on 05/21/23  POCT glucose (manual entry)   Collection Time: 05/21/23  3:22 PM  Result Value Ref Range   POC Glucose 112 (A) 70 - 99 mg/dl  POCT glycosylated hemoglobin (Hb A1C)   Collection Time: 05/21/23  3:27 PM  Result Value Ref Range   Hemoglobin A1C     HbA1c POC (<> result, manual entry)     HbA1c, POC (prediabetic range)     HbA1c, POC (controlled diabetic range) 6.5 0.0 - 7.0 %   Her A1c is 6.5, slightly increased from 6.1, but still within the target range of less than 7. She checks her blood sugars daily, which range from 90 to 130. She continues to take metformin 500mg , two tablets daily, and has no issues with tolerance. The patient has been unable to attend the [facility] for swimming due to car trouble, but plans to resume in the new year. Her weight has remained stable at 231 pounds, with a goal to lose weight in the coming year. She reports good eating habits, including a healthy breakfast, drinking water, and infused water in the morning.  HTN/CAD/HL:  She continues to take carvedilol 25mg  twice daily, isosorbide 20mg  three times daily, hydralazine 50mg  three times daily, and atorvastatin 10mg  daily. She reports no chest pain or shortness of  breath.  No LE edema  IDA:  The patient also has anemia and takes an iron supplement four times a week.   CKD 3a: GFR has remained stable in the 50s.  Overdue for recheck.  HM:  She is due for a mammogram.  Reports having had an eye exam this year.  I will send a request to her optometrist for report so that we can update her health maintenance.       Patient Active Problem List   Diagnosis Date Noted   Hypertension associated with stage 3a chronic kidney disease due to type 2 diabetes mellitus (HCC) 10/07/2020   Hyperlipidemia associated with type 2 diabetes mellitus (HCC) 10/07/2020   Arthritis of finger of left hand 08/05/2018   Primary osteoarthritis of both knees 10/29/2017   CAD (coronary artery disease) 02/08/2017   Insomnia 11/06/2016   Iron deficiency anemia 09/14/2016   Shoulder pain, left 02/11/2016   Right carpal tunnel syndrome 07/29/2014   Vitamin D insufficiency 04/13/2014   Obesity (BMI 30-39.9) 03/13/2013   Gout 05/20/2009   HEART MURMUR, SYSTOLIC 05/06/2009   DM2 (diabetes mellitus, type 2) (HCC) 04/22/2009   Essential hypertension 04/22/2009     Current Outpatient Medications on File Prior to Visit  Medication Sig Dispense Refill   albuterol (VENTOLIN HFA) 108 (90 Base) MCG/ACT inhaler INHALE 2 PUFFS INTO THE LUNGS EVERY 6 HOURS AS NEEDED FOR WHEEZING OR SHORTNESS OF BREATH. 3  each 2   aspirin EC 81 MG tablet Take 1 tablet (81 mg total) by mouth daily. 90 tablet 3   atorvastatin (LIPITOR) 10 MG tablet TAKE 1 TABLET EVERY DAY 90 tablet 3   Blood Glucose Monitoring Suppl (ONETOUCH VERIO) w/Device KIT Use to check blood sugar once daily. 1 kit 0   Blood Pressure KIT 1 each by Does not apply route daily. 1 each 0   carvedilol (COREG) 25 MG tablet TAKE 1 TABLET TWICE DAILY WITH MEALS 60 tablet 0   diclofenac Sodium (VOLTAREN) 1 % GEL APPLY 2 GRAMS TOPICALLY FOUR TIMES DAILY 200 g 0   gabapentin (NEURONTIN) 100 MG capsule TAKE 3 CAPSULES AT BEDTIME 270 capsule 3    hydrALAZINE (APRESOLINE) 50 MG tablet Take 1 tablet (50 mg total) by mouth 3 (three) times daily. 270 tablet 2   indomethacin (INDOCIN) 50 MG capsule TAKE 1 CAPSULE TWICE DAILY AS NEEDED FOR ACUTE GOUT ATTACKS 60 capsule 0   isosorbide dinitrate (ISORDIL) 20 MG tablet Take 1 tablet (20 mg total) by mouth 3 (three) times daily. 270 tablet 1   loratadine (CLARITIN) 10 MG tablet Take 1 tablet (10 mg total) by mouth daily as needed for allergies. 90 tablet 2   metFORMIN (GLUCOPHAGE-XR) 500 MG 24 hr tablet Take 2 tablets (1,000 mg total) by mouth daily with breakfast. 180 tablet 1   traMADol (ULTRAM) 50 MG tablet Take 1 tablet (50 mg total) by mouth daily as needed. 30 tablet 0   Triamcinolone Acetonide (TRIAMCINOLONE 0.1 % CREAM : EUCERIN) CREA Apply 1 application topically 2 (two) times daily as needed. Please mix 1:1 (Patient taking differently: Apply 1 application  topically 2 (two) times daily as needed. Please mix 1:1) 1 each 3   triamcinolone cream (KENALOG) 0.1 % APPLY TOPICALLY TWICE DAILY AS NEEDED 60 g 5   TRUE METRIX BLOOD GLUCOSE TEST test strip TEST BLOOD SUGAR EVERY DAY 100 strip 3   TRUEplus Lancets 33G MISC TEST BLOOD SUGAR EVERY DAY 100 each 3   No current facility-administered medications on file prior to visit.    Allergies  Allergen Reactions   Lisinopril Swelling    REACTION: Swolen lips (only)   Hctz [Hydrochlorothiazide] Other (See Comments)    Joint pain, hyperuricemia w/ hx of gout   Naproxen Hives, Itching and Palpitations   Norvasc [Amlodipine Besylate] Other (See Comments)    Peripheral edema     Social History   Socioeconomic History   Marital status: Single    Spouse name: Not on file   Number of children: Not on file   Years of education: Not on file   Highest education level: Not on file  Occupational History   Not on file  Tobacco Use   Smoking status: Former    Current packs/day: 0.00    Types: Cigarettes    Quit date: 2009    Years since  quitting: 15.9   Smokeless tobacco: Never  Substance and Sexual Activity   Alcohol use: Not Currently    Comment: occasionally   Drug use: No   Sexual activity: Not Currently  Other Topics Concern   Not on file  Social History Narrative   Not on file   Social Drivers of Health   Financial Resource Strain: Low Risk  (01/19/2023)   Overall Financial Resource Strain (CARDIA)    Difficulty of Paying Living Expenses: Not hard at all  Food Insecurity: No Food Insecurity (01/19/2023)   Hunger Vital Sign  Worried About Programme researcher, broadcasting/film/video in the Last Year: Never true    Ran Out of Food in the Last Year: Never true  Transportation Needs: No Transportation Needs (01/19/2023)   PRAPARE - Administrator, Civil Service (Medical): No    Lack of Transportation (Non-Medical): No  Physical Activity: Insufficiently Active (01/19/2023)   Exercise Vital Sign    Days of Exercise per Week: 3 days    Minutes of Exercise per Session: 30 min  Stress: No Stress Concern Present (01/19/2023)   [employer] of Occupational Health - Occupational Stress Questionnaire    Feeling of Stress : Not at all  Social Connections: Moderately Isolated (01/19/2023)   Social Connection and Isolation Panel [NHANES]    Frequency of Communication with Friends and Family: More than three times a week    Frequency of Social Gatherings with Friends and Family: Three times a week    Attends Religious Services: More than 4 times per year    Active Member of Clubs or Organizations: No    Attends Banker Meetings: Never    Marital Status: Never married  Intimate Partner Violence: Not At Risk (01/19/2023)   Humiliation, Afraid, Rape, and Kick questionnaire    Fear of Current or Ex-Partner: No    Emotionally Abused: No    Physically Abused: No    Sexually Abused: No    Family History  Problem Relation Age of Onset   Diabetes Mother    Diabetes Sister     Past Surgical History:  Procedure  Laterality Date   APPENDECTOMY     CORONARY PRESSURE/FFR STUDY N/A 09/07/2016   Procedure: Intravascular Pressure Wire/FFR Study;  Surgeon: Yvonne Kendall, MD;  Location: [facility] INVASIVE CV LAB;  Service: Cardiovascular;  Laterality: N/A;   LEFT HEART CATH AND CORONARY ANGIOGRAPHY N/A 09/07/2016   Procedure: Left Heart Cath and Coronary Angiography;  Surgeon: Yvonne Kendall, MD;  [facility] INVASIVE CV LAB;  Service: Cardiovascular;  Laterality: N/A;   TUBAL LIGATION      ROS: Review of Systems Negative except as stated above  PHYSICAL EXAM: BP 123/78   Pulse 66   Temp 98 F (36.7 C) (Oral)   Ht 5\' 7"  (1.702 m)   Wt 231 lb (104.8 kg)   SpO2 99%   BMI 36.18 kg/m   Wt Readings from Last 3 Encounters:  05/21/23 231 lb (104.8 kg)  01/19/23 230 lb (104.3 kg)  10/15/22 230 lb (104.3 kg)    Physical Exam  General appearance - alert, well appearing, older African-American female and in no distress Mental status - normal mood, behavior, speech, dress, motor activity, and thought processes Neck - supple, no significant adenopathy Chest - clear to auscultation, no wheezes, rales or rhonchi, symmetric air entry Heart - normal rate, regular rhythm, normal S1, S2, no murmurs, rubs, clicks or gallops Extremities - peripheral pulses normal, no pedal edema, no clubbing or cyanosis      Latest Ref Rng & Units 06/12/2022    9:28 AM 10/22/2021    4:51 PM 10/02/2021    9:35 AM  CMP  Glucose 70 - 99 mg/dL 89  914  782   BUN 8 - 27 mg/dL 15  12  14    Creatinine 0.57 - 1.00 mg/dL 9.56  2.13  0.86   Sodium 134 - 144 mmol/L 141  138  140   Potassium 3.5 - 5.2 mmol/L 4.5  3.8  4.2   Chloride 96 - 106 mmol/L  106  109  106   CO2 20 - 29 mmol/L 18  20  20    Calcium 8.7 - 10.3 mg/dL 9.7  9.1  9.5   Total Protein 6.0 - 8.5 g/dL 7.0  7.2  7.1   Total Bilirubin 0.0 - 1.2 mg/dL 0.3  1.1  0.4   Alkaline Phos 44 - 121 IU/L 87  66  85   AST 0 - 40 IU/L 17  19  18    ALT 0 - 32 IU/L 10  13  12     Lipid  Panel     Component Value Date/Time   CHOL 162 10/15/2022 0920   TRIG 133 10/15/2022 0920   HDL 46 10/15/2022 0920   CHOLHDL 3.5 10/15/2022 0920   CHOLHDL 3.5 09/05/2016 0228   VLDL 22 09/05/2016 0228   LDLCALC 92 10/15/2022 0920    CBC    Component Value Date/Time   WBC 8.4 10/15/2022 0920   WBC 6.7 10/22/2021 1651   RBC 3.84 10/15/2022 0920   RBC 4.10 10/22/2021 1651   HGB 10.6 (L) 10/15/2022 0920   HCT 33.0 (L) 10/15/2022 0920   PLT 325 10/15/2022 0920   MCV 86 10/15/2022 0920   MCH 27.6 10/15/2022 0920   MCH 29.5 10/22/2021 1651   MCHC 32.1 10/15/2022 0920   MCHC 31.6 10/22/2021 1651   RDW 13.7 10/15/2022 0920   LYMPHSABS 2.6 04/22/2009 2257   MONOABS 0.6 04/22/2009 2257   EOSABS 0.1 04/22/2009 2257   BASOSABS 0.0 04/22/2009 2257    ASSESSMENT AND PLAN: 1. Type 2 diabetes mellitus with morbid obesity (HCC) (Primary) DM type II treated with oral medicine Diabetes under good control.  Continue metformin 1 g daily. Meets criteria for morbid obesity with BMI greater than 35 and comorbid conditions of diabetes, hypertension and hyperlipidemia.  Encouraged her to continue healthy eating habits.  She plans to get back to the [facility] in the new year. - POCT glycosylated hemoglobin (Hb A1C) - POCT glucose (manual entry) - Comprehensive metabolic panel - Lipid panel - CBC  2. Hypertension associated with diabetes (HCC) At goal.  Continue hydralazine 50 mg 3 times a day, isosorbide 20 mg 3 times a day and carvedilol 25 mg twice a day.  3. Hyperlipidemia associated with type 2 diabetes mellitus (HCC) Continue atorvastatin 10 mg daily.  Ideally should be on higher dose statin due to history of CAD. Will check lipid profile today.  4. Coronary artery disease involving native coronary artery of native heart without angina pectoris Continue carvedilol, atorvastatin, isosorbide and aspirin  5. Stage 3a chronic kidney disease (HCC) GFR has been stable in the 50s.  Check  chemistry today.  6. Other iron deficiency anemia We will recheck CBC.  Continue iron supplement 4 days a week - CBC  7. Encounter for screening mammogram for malignant neoplasm of breast - MM Digital Screening; Future    Patient was given the opportunity to ask questions.  Patient verbalized understanding of the plan and was able to repeat key elements of the plan.   This documentation was completed using Paediatric nurse.  Any transcriptional errors are unintentional.  Orders Placed This Encounter  Procedures   MM Digital Screening   Comprehensive metabolic panel   Lipid panel   CBC   POCT glycosylated hemoglobin (Hb A1C)   POCT glucose (manual entry)     Requested Prescriptions    No prescriptions requested or ordered in this encounter    Return in about  4 months (around 09/19/2023).  Jonah Blue, MD, FACP

## 2023-05-22 LAB — COMPREHENSIVE METABOLIC PANEL
ALT: 14 [IU]/L (ref 0–32)
AST: 18 [IU]/L (ref 0–40)
Albumin: 4.2 g/dL (ref 3.8–4.8)
Alkaline Phosphatase: 101 [IU]/L (ref 44–121)
BUN/Creatinine Ratio: 10 — ABNORMAL LOW (ref 12–28)
BUN: 11 mg/dL (ref 8–27)
Bilirubin Total: 0.4 mg/dL (ref 0.0–1.2)
CO2: 18 mmol/L — ABNORMAL LOW (ref 20–29)
Calcium: 9.4 mg/dL (ref 8.7–10.3)
Chloride: 108 mmol/L — ABNORMAL HIGH (ref 96–106)
Creatinine, Ser: 1.07 mg/dL — ABNORMAL HIGH (ref 0.57–1.00)
Globulin, Total: 3.1 g/dL (ref 1.5–4.5)
Glucose: 107 mg/dL — ABNORMAL HIGH (ref 70–99)
Potassium: 4.5 mmol/L (ref 3.5–5.2)
Sodium: 144 mmol/L (ref 134–144)
Total Protein: 7.3 g/dL (ref 6.0–8.5)
eGFR: 55 mL/min/{1.73_m2} — ABNORMAL LOW (ref >59)

## 2023-05-22 LAB — CBC
Hematocrit: 33.8 % — ABNORMAL LOW (ref 34.0–46.6)
Hemoglobin: 10.9 g/dL — ABNORMAL LOW (ref 11.1–15.9)
MCH: 28.3 pg (ref 26.6–33.0)
MCHC: 32.2 g/dL (ref 31.5–35.7)
MCV: 88 fL (ref 79–97)
Platelets: 267 10*3/uL (ref 150–450)
RBC: 3.85 x10E6/uL (ref 3.77–5.28)
RDW: 14.3 % (ref 11.7–15.4)
WBC: 7.1 10*3/uL (ref 3.4–10.8)

## 2023-05-22 LAB — LIPID PANEL
Chol/HDL Ratio: 2.9 {ratio} (ref 0.0–4.4)
Cholesterol, Total: 140 mg/dL (ref 100–199)
HDL: 49 mg/dL (ref >39)
LDL Chol Calc (NIH): 73 mg/dL (ref 0–99)
Triglycerides: 98 mg/dL (ref 0–149)
VLDL Cholesterol Cal: 18 mg/dL (ref 5–40)

## 2023-06-01 ENCOUNTER — Other Ambulatory Visit: Payer: Self-pay | Admitting: Internal Medicine

## 2023-06-01 DIAGNOSIS — I152 Hypertension secondary to endocrine disorders: Secondary | ICD-10-CM

## 2023-06-01 DIAGNOSIS — I2510 Atherosclerotic heart disease of native coronary artery without angina pectoris: Secondary | ICD-10-CM

## 2023-06-03 NOTE — Telephone Encounter (Signed)
All labs in date.  Requested Prescriptions  Pending Prescriptions Disp Refills   hydrALAZINE (APRESOLINE) 50 MG tablet [Pharmacy Med Name: hydrALAZINE HCl Oral Tablet 50 MG] 270 tablet 3    Sig: TAKE 1 TABLET THREE TIMES DAILY     Cardiovascular:  Vasodilators Failed - 06/03/2023  8:30 AM      Failed - HCT in normal range and within 360 days    Hematocrit  Date Value Ref Range Status  05/21/2023 33.8 (L) 34.0 - 46.6 % Final         Failed - HGB in normal range and within 360 days    Hemoglobin  Date Value Ref Range Status  05/21/2023 10.9 (L) 11.1 - 15.9 g/dL Final         Failed - ANA Screen, Ifa, Serum in normal range and within 360 days    No results found for: "ANA", "ANATITER", "LABANTI"       Passed - RBC in normal range and within 360 days    RBC  Date Value Ref Range Status  05/21/2023 3.85 3.77 - 5.28 x10E6/uL Final  10/22/2021 4.10 3.87 - 5.11 MIL/uL Final         Passed - WBC in normal range and within 360 days    WBC  Date Value Ref Range Status  05/21/2023 7.1 3.4 - 10.8 x10E3/uL Final  10/22/2021 6.7 4.0 - 10.5 K/uL Final         Passed - PLT in normal range and within 360 days    Platelets  Date Value Ref Range Status  05/21/2023 267 150 - 450 x10E3/uL Final         Passed - Last BP in normal range    BP Readings from Last 1 Encounters:  05/21/23 123/78         Passed - Valid encounter within last 12 months    Recent Outpatient Visits           1 week ago Type 2 diabetes mellitus with morbid obesity (HCC)   [place] [facility] - A Dept Of [place]. [facility] Jonah Blue B, MD   7 months ago Type 2 diabetes mellitus with obesity [facility])   [place] [facility] - A Dept Of [place]. [facility] Marcine Matar, MD   10 months ago Hypertension associated with diabetes [facility])   [place] [facility] - A Dept Of [place]. [facility] Lois Huxley, Gonzales L, RPH-CPP   11 months ago  Type 2 diabetes mellitus with obesity [facility])   [place] [facility] - A Dept Of [place]. [facility] Marcine Matar, MD   1 year ago Encounter for [employer] annual wellness exam   [place] [facility] [place] - A Dept Of [place]. [facility] Drucilla Chalet, RPH-CPP       Future Appointments             In 3 months Laural Benes, Binnie Rail, MD [facility] [place] - A Dept Of Eligha Bridegroom. [facility]             carvedilol (COREG) 25 MG tablet [Pharmacy Med Name: Carvedilol Oral Tablet 25 MG] 60 tablet 11    Sig: TAKE 1 TABLET TWICE DAILY WITH MEALS     Cardiovascular: Beta Blockers 3 Failed - 06/03/2023  8:30 AM      Failed - Cr in normal  range and within 360 days    Creat  Date Value Ref Range Status  08/03/2016 1.19 (H) 0.50 - 0.99 mg/dL Final    Comment:      For patients > or = 74 years of age: The upper reference limit for Creatinine is approximately 13% higher for people identified as African-American.      Creatinine, Ser  Date Value Ref Range Status  05/21/2023 1.07 (H) 0.57 - 1.00 mg/dL Final   Creatinine, Urine  Date Value Ref Range Status  07/09/2015 120 20 - 320 mg/dL Final         Passed - AST in normal range and within 360 days    AST  Date Value Ref Range Status  05/21/2023 18 0 - 40 IU/L Final         Passed - ALT in normal range and within 360 days    ALT  Date Value Ref Range Status  05/21/2023 14 0 - 32 IU/L Final         Passed - Last BP in normal range    BP Readings from Last 1 Encounters:  05/21/23 123/78         Passed - Last Heart Rate in normal range    Pulse Readings from Last 1 Encounters:  05/21/23 66         Passed - Valid encounter within last 6 months    Recent Outpatient Visits           1 week ago Type 2 diabetes mellitus with morbid obesity (HCC)   [place] [facility] - A Dept Of [place]. [facility] Jonah Blue B, MD   7  months ago Type 2 diabetes mellitus with obesity [facility])   [place] [facility] - A Dept Of [place]. [facility] Marcine Matar, MD   10 months ago Hypertension associated with diabetes [facility])   [place] [facility] - A Dept Of [place]. [facility] Lois Huxley, Holley L, RPH-CPP   11 months ago Type 2 diabetes mellitus with obesity [facility])   [place] [facility] - A Dept Of [place]. [facility] Marcine Matar, MD   1 year ago Encounter for [employer] annual wellness exam   [place] [facility] [place] - A Dept Of . [facility] Drucilla Chalet, RPH-CPP       Future Appointments             In 3 months Laural Benes, Binnie Rail, MD [facility] [place] - A Dept Of [place]igh[place] Bridegroom. [facility]

## 2023-06-04 ENCOUNTER — Other Ambulatory Visit: Payer: Self-pay | Admitting: Internal Medicine

## 2023-06-04 DIAGNOSIS — G5601 Carpal tunnel syndrome, right upper limb: Secondary | ICD-10-CM

## 2023-06-04 NOTE — Telephone Encounter (Signed)
Medication Refill -  Most Recent Primary Care Visit:  Provider: Jonah Blue B  Department[facility]  Visit Type: OFFICE VISIT  Date: 05/21/2023  Medication: Gabapentin 300 mg  Has the patient contacted their pharmacy? Yes (Agent: If no, request that the patient contact the pharmacy for the refill. If patient does not wish to contact the pharmacy document the reason why and proceed with request.) (Agent: If yes, when and what did the pharmacy advise?)  [facility]  Is this the correct pharmacy for this prescription? Yes If no, delete pharmacy and type the correct one.  This is the patient's preferred pharmacy:   Has the prescription been filled recently? Yes  Is the patient out of the medication? Yes  Has the patient been seen for an appointment in the last year OR does the patient have an upcoming appointment? Yes  Can we respond through MyChart? No  Agent: Please be advised that Rx refills may take up to 3 business days. We ask that you follow-up with your pharmacy.

## 2023-06-09 NOTE — Telephone Encounter (Signed)
 Requested medication (s) are due for refill today: yes  Requested medication (s) are on the active medication list: yes  Last refill:  11/05/20 #270 3 RF  Future visit scheduled: yes  Notes to clinic:  last prescribed in 2022   Requested Prescriptions  Pending Prescriptions Disp Refills   gabapentin  (NEURONTIN ) 100 MG capsule 270 capsule 3    Sig: TAKE 3 CAPSULES AT BEDTIME     Neurology: Anticonvulsants - gabapentin  Failed - 06/09/2023 10:36 AM      Failed - Cr in normal range and within 360 days    Creat  Date Value Ref Range Status  08/03/2016 1.19 (H) 0.50 - 0.99 mg/dL Final    Comment:      For patients > or = 75 years of age: The upper reference limit for Creatinine is approximately 13% higher for people identified as African-American.      Creatinine, Ser  Date Value Ref Range Status  05/21/2023 1.07 (H) 0.57 - 1.00 mg/dL Final   Creatinine, Urine  Date Value Ref Range Status  07/09/2015 120 20 - 320 mg/dL Final         Passed - Completed PHQ-2 or PHQ-9 in the last 360 days      Passed - Valid encounter within last 12 months    Recent Outpatient Visits           2 weeks ago Type 2 diabetes mellitus with morbid obesity (HCC)   [place] [facility] Dept Of [place]. [facility] Vicci Sober B, MD   7 months ago Type 2 diabetes mellitus with obesity [facility])   [place] [facility] - A Dept Of [place]. [facility] Vicci Sober NOVAK, MD   10 months ago Hypertension associated with diabetes [facility])   [place] [facility] - A Dept Of [place]. [facility] Fleeta Morris, [place] L, RPH-CPP   12 months ago Type 2 diabetes mellitus with obesity [facility])   [place] [facility] - A Dept Of [place]. [facility] Vicci Sober NOVAK, MD   1 year ago Encounter for [employer] annual wellness exam   [place] [facility] [place] - A Dept Of [place]. [facility] Fleeta Morris Garnette LITTIE, RPH-CPP       Future Appointments             In 3 months Vicci, Sober NOVAK, MD [facility] [place] - A Dept Of [place]. [facility]

## 2023-06-10 MED ORDER — GABAPENTIN 100 MG PO CAPS
ORAL_CAPSULE | ORAL | 1 refills | Status: AC
Start: 2023-06-10 — End: ?

## 2023-06-23 ENCOUNTER — Ambulatory Visit
Admission: RE | Admit: 2023-06-23 | Discharge: 2023-06-23 | Disposition: A | Discharge status: Home / Self Care | Payer: Medicare HMO | Source: Ambulatory Visit | Attending: Internal Medicine | Admitting: Internal Medicine

## 2023-06-23 DIAGNOSIS — Z1231 Encounter for screening mammogram for malignant neoplasm of breast: Secondary | ICD-10-CM | POA: Diagnosis not present

## 2023-08-06 ENCOUNTER — Other Ambulatory Visit: Payer: Self-pay | Admitting: Internal Medicine

## 2023-08-06 DIAGNOSIS — M170 Bilateral primary osteoarthritis of knee: Secondary | ICD-10-CM

## 2023-09-14 DIAGNOSIS — H5203 Hypermetropia, bilateral: Secondary | ICD-10-CM | POA: Diagnosis not present

## 2023-09-14 LAB — HM DIABETES EYE EXAM

## 2023-09-24 ENCOUNTER — Ambulatory Visit: Payer: Medicare HMO | Admitting: Internal Medicine

## 2023-10-11 ENCOUNTER — Other Ambulatory Visit: Payer: Self-pay | Admitting: Internal Medicine

## 2023-10-11 DIAGNOSIS — M170 Bilateral primary osteoarthritis of knee: Secondary | ICD-10-CM

## 2023-10-22 ENCOUNTER — Ambulatory Visit: Admitting: Internal Medicine

## 2023-12-14 ENCOUNTER — Encounter: Payer: Self-pay | Admitting: Internal Medicine

## 2023-12-14 ENCOUNTER — Ambulatory Visit: Attending: Internal Medicine | Admitting: Internal Medicine

## 2023-12-14 VITALS — BP 128/71 | HR 69 | Temp 97.9°F | Ht 67.0 in | Wt 219.0 lb

## 2023-12-14 DIAGNOSIS — L299 Pruritus, unspecified: Secondary | ICD-10-CM

## 2023-12-14 DIAGNOSIS — E785 Hyperlipidemia, unspecified: Secondary | ICD-10-CM | POA: Diagnosis not present

## 2023-12-14 DIAGNOSIS — Z6834 Body mass index (BMI) 34.0-34.9, adult: Secondary | ICD-10-CM

## 2023-12-14 DIAGNOSIS — E119 Type 2 diabetes mellitus without complications: Secondary | ICD-10-CM

## 2023-12-14 DIAGNOSIS — N1831 Chronic kidney disease, stage 3a: Secondary | ICD-10-CM | POA: Diagnosis not present

## 2023-12-14 DIAGNOSIS — E669 Obesity, unspecified: Secondary | ICD-10-CM | POA: Diagnosis not present

## 2023-12-14 DIAGNOSIS — G8929 Other chronic pain: Secondary | ICD-10-CM

## 2023-12-14 DIAGNOSIS — Z1211 Encounter for screening for malignant neoplasm of colon: Secondary | ICD-10-CM

## 2023-12-14 DIAGNOSIS — I152 Hypertension secondary to endocrine disorders: Secondary | ICD-10-CM | POA: Diagnosis not present

## 2023-12-14 DIAGNOSIS — M170 Bilateral primary osteoarthritis of knee: Secondary | ICD-10-CM

## 2023-12-14 DIAGNOSIS — I129 Hypertensive chronic kidney disease with stage 1 through stage 4 chronic kidney disease, or unspecified chronic kidney disease: Secondary | ICD-10-CM | POA: Diagnosis not present

## 2023-12-14 DIAGNOSIS — E1159 Type 2 diabetes mellitus with other circulatory complications: Secondary | ICD-10-CM

## 2023-12-14 DIAGNOSIS — M545 Low back pain, unspecified: Secondary | ICD-10-CM | POA: Diagnosis not present

## 2023-12-14 DIAGNOSIS — I2510 Atherosclerotic heart disease of native coronary artery without angina pectoris: Secondary | ICD-10-CM | POA: Diagnosis not present

## 2023-12-14 DIAGNOSIS — E1169 Type 2 diabetes mellitus with other specified complication: Secondary | ICD-10-CM

## 2023-12-14 DIAGNOSIS — Z7984 Long term (current) use of oral hypoglycemic drugs: Secondary | ICD-10-CM | POA: Diagnosis not present

## 2023-12-14 DIAGNOSIS — I10 Essential (primary) hypertension: Secondary | ICD-10-CM | POA: Diagnosis not present

## 2023-12-14 LAB — POCT GLYCOSYLATED HEMOGLOBIN (HGB A1C): HbA1c, POC (controlled diabetic range): 6.1 % (ref 0.0–7.0)

## 2023-12-14 LAB — GLUCOSE, POCT (MANUAL RESULT ENTRY): POC Glucose: 147 mg/dL — AB (ref 70–99)

## 2023-12-14 MED ORDER — TRIAMCINOLONE ACETONIDE 0.1 % EX CREA: TOPICAL_CREAM | CUTANEOUS | 5 refills | Status: AC

## 2023-12-14 MED ORDER — LIDOCAINE 5 % EX PTCH: 1.0000 | MEDICATED_PATCH | Freq: Every day | CUTANEOUS | 0 refills | Status: DC | PRN

## 2023-12-14 NOTE — Progress Notes (Signed)
 Patient ID: Desiree Sanchez, female    DOB: January 11, 1951  MRN: [id number]  CC: Diabetes (DM f/u./Requesting lidocaine  patches for back pain - 5%/)   Subjective: Alyzza Andringa is a 73 y.o. female who presents for chronic ds management. Her concerns today include:  Pt with hx of HTN, HL, DM, CKD stage 3, insomnia non-obstructing CAD (cath 08/2016, for med management), RT CTS, OA knees, gout, IDA.   Discussed the use of AI scribe software for clinical note transcription with the patient, who gave verbal consent to proceed.  History of Present Illness Desiree Sanchez is a 73 year old female with diabetes, hypertension, CAD and chronic kidney disease who presents for follow-up care.  DM: Results for orders placed or performed in visit on 12/14/23  POCT glucose (manual entry)   Collection Time: 12/14/23 11:59 AM  Result Value Ref Range   POC Glucose 147 (A) 70 - 99 mg/dl  POCT glycosylated hemoglobin (Hb A1C)   Collection Time: 12/14/23 12:02 PM  Result Value Ref Range   Hemoglobin A1C     HbA1c POC (<> result, manual entry)     HbA1c, POC (prediabetic range)     HbA1c, POC (controlled diabetic range) 6.1 0.0 - 7.0 %  Diabetes is well-controlled with metformin  1000 mg once daily, with a recent hemoglobin A1c of 6.1%. Blood glucose monitoring is infrequent, with the last fasting level at 91 mg/dL. She maintains a diet rich in vegetables and fruits and engages in physical activity two to three times a week at the [facility], including treadmill, weight machines, and swimming. She has lost 12 pounds since December, currently weighing 219 pounds.  HTN/CAD: Hypertension is managed with hydralazine  50 mg three times a day, isosorbide  20 mg three times a day, and carvedilol  25 mg twice a day.  She is also on atorvastatin  10 mg daily and aspirin .  Reports compliance. She denies chest pain or shortness of breath.  Chronic kidney disease is at stage 3, with stable kidney function as of the last check in  December.  GFR has been in the 50s  She experiences arthritis in her knees, which is alleviated by exercise. Back pain began two months ago after moving furniture, described as severe pain in the lower back with spasms, radiating to the buttocks and lower back, especially when lifting her leg. Lidocaine  patches provided by her sister alleviate the pain significantly, and she uses them as needed for flare-ups, ensuring not to leave them on for more than 12 hours.  She is requesting a prescription for the lidocaine  patches to use as needed.  She finds it more helpful than the tramadol .    Patient Active Problem List   Diagnosis Date Noted   Hypertension associated with stage 3a chronic kidney disease due to type 2 diabetes mellitus (HCC) 10/07/2020   Hyperlipidemia associated with type 2 diabetes mellitus (HCC) 10/07/2020   Arthritis of finger of left hand 08/05/2018   Primary osteoarthritis of both knees 10/29/2017   CAD (coronary artery disease) 02/08/2017   Insomnia 11/06/2016   Iron deficiency anemia 09/14/2016   Shoulder pain, left 02/11/2016   Right carpal tunnel syndrome 07/29/2014   Vitamin D  insufficiency 04/13/2014   Obesity (BMI 30-39.9) 03/13/2013   Gout 05/20/2009   HEART MURMUR, SYSTOLIC 05/06/2009   DM2 (diabetes mellitus, type 2) (HCC) 04/22/2009   Essential hypertension 04/22/2009     Current Outpatient Medications on File Prior to Visit  Medication Sig Dispense Refill   albuterol  (  VENTOLIN  HFA) 108 (90 Base) MCG/ACT inhaler INHALE 2 PUFFS INTO THE LUNGS EVERY 6 HOURS AS NEEDED FOR WHEEZING OR SHORTNESS OF BREATH. 3 each 6   aspirin  EC 81 MG tablet Take 1 tablet (81 mg total) by mouth daily. 90 tablet 3   atorvastatin  (LIPITOR) 10 MG tablet TAKE 1 TABLET EVERY DAY 90 tablet 3   Blood Glucose Monitoring Suppl (ONETOUCH VERIO) w/Device KIT Use to check blood sugar once daily. 1 kit 0   Blood Pressure KIT 1 each by Does not apply route daily. 1 each 0   carvedilol  (COREG )  25 MG tablet TAKE 1 TABLET TWICE DAILY WITH MEALS 60 tablet 11   diclofenac  Sodium (VOLTAREN ) 1 % GEL APPLY 2 GRAMS TOPICALLY FOUR TIMES DAILY 200 g 3   gabapentin  (NEURONTIN ) 100 MG capsule TAKE 3 CAPSULES AT BEDTIME 270 capsule 1   hydrALAZINE  (APRESOLINE ) 50 MG tablet TAKE 1 TABLET THREE TIMES DAILY 270 tablet 3   indomethacin  (INDOCIN ) 50 MG capsule TAKE 1 CAPSULE TWICE DAILY AS NEEDED FOR ACUTE GOUT ATTACKS 60 capsule 0   isosorbide  dinitrate (ISORDIL ) 20 MG tablet Take 1 tablet (20 mg total) by mouth 3 (three) times daily. 270 tablet 1   loratadine  (CLARITIN ) 10 MG tablet Take 1 tablet (10 mg total) by mouth daily as needed for allergies. 90 tablet 2   metFORMIN  (GLUCOPHAGE -XR) 500 MG 24 hr tablet Take 2 tablets (1,000 mg total) by mouth daily with breakfast. 180 tablet 1   traMADol  (ULTRAM ) 50 MG tablet TAKE 1 TABLET DAILY AS NEEDED 30 tablet 0   Triamcinolone  Acetonide (TRIAMCINOLONE  0.1 % CREAM : EUCERIN) CREA Apply 1 application topically 2 (two) times daily as needed. Please mix 1:1 (Patient taking differently: Apply 1 application  topically 2 (two) times daily as needed. Please mix 1:1) 1 each 3   TRUE METRIX BLOOD GLUCOSE TEST test strip TEST BLOOD SUGAR EVERY DAY 100 strip 3   TRUEplus Lancets 33G MISC TEST BLOOD SUGAR EVERY DAY 100 each 3   No current facility-administered medications on file prior to visit.    Allergies  Allergen Reactions   Lisinopril Swelling    REACTION: Swolen lips (only)   Hctz [Hydrochlorothiazide ] Other (See Comments)    Joint pain, hyperuricemia w/ hx of gout   Naproxen Hives, Itching and Palpitations   Norvasc  [Amlodipine  Besylate] Other (See Comments)    Peripheral edema     Social History   Socioeconomic History   Marital status: Single    Spouse name: Not on file   Number of children: Not on file   Years of education: Not on file   Highest education level: Not on file  Occupational History   Not on file  Tobacco Use   Smoking status:  Former    Current packs/day: 0.00    Types: Cigarettes    Quit date: 2009    Years since quitting: 16.5   Smokeless tobacco: Never  Substance and Sexual Activity   Alcohol use: Not Currently    Comment: occasionally   Drug use: No   Sexual activity: Not Currently  Other Topics Concern   Not on file  Social History Narrative   Not on file   Social Drivers of Health   Financial Resource Strain: Low Risk  (12/14/2023)   Overall Financial Resource Strain (CARDIA)    Difficulty of Paying Living Expenses: Not hard at all  Food Insecurity: No Food Insecurity (12/14/2023)   Hunger Vital Sign    Worried About  Running Out of Food in the Last Year: Never true    Ran Out of Food in the Last Year: Never true  Transportation Needs: No Transportation Needs (12/14/2023)   PRAPARE - Administrator, Civil Service (Medical): No    Lack of Transportation (Non-Medical): No  Physical Activity: Insufficiently Active (12/14/2023)   Exercise Vital Sign    Days of Exercise per Week: 3 days    Minutes of Exercise per Session: 20 min  Stress: No Stress Concern Present (12/14/2023)   [employer] of Occupational Health - Occupational Stress Questionnaire    Feeling of Stress: Not at all  Social Connections: Moderately Integrated (12/14/2023)   Social Connection and Isolation Panel    Frequency of Communication with Friends and Family: More than three times a week    Frequency of Social Gatherings with Friends and Family: More than three times a week    Attends Religious Services: More than 4 times per year    Active Member of Clubs or Organizations: Yes    Attends Banker Meetings: More than 4 times per year    Marital Status: Divorced  Intimate Partner Violence: Not At Risk (12/14/2023)   Humiliation, Afraid, Rape, and Kick questionnaire    Fear of Current or Ex-Partner: No    Emotionally Abused: No    Physically Abused: No    Sexually Abused: No    Family History  Problem  Relation Age of Onset   Diabetes Mother    Diabetes Sister     Past Surgical History:  Procedure Laterality Date   APPENDECTOMY     CORONARY PRESSURE/FFR STUDY N/A 09/07/2016   Procedure: Intravascular Pressure Wire/FFR Study;  Surgeon: Lonni Hanson, MD;  [facility] INVASIVE CV LAB;  Service: Cardiovascular;  Laterality: N/A;   LEFT HEART CATH AND CORONARY ANGIOGRAPHY N/A 09/07/2016   Procedure: Left Heart Cath and Coronary Angiography;  Surgeon: Lonni Hanson, MD;  Location: [facility] INVASIVE CV LAB;  Service: Cardiovascular;  Laterality: N/A;   TUBAL LIGATION      ROS: Review of Systems Negative except as stated above  PHYSICAL EXAM: BP 128/71 (BP Location: Left Arm, Patient Position: Sitting, Cuff Size: Large)   Pulse 69   Temp 97.9 F (36.6 C) (Oral)   Ht 5' 7 (1.702 m)   Wt 219 lb (99.3 kg)   SpO2 98%   BMI 34.30 kg/m   Wt Readings from Last 3 Encounters:  12/14/23 219 lb (99.3 kg)  05/21/23 231 lb (104.8 kg)  01/19/23 230 lb (104.3 kg)    Physical Exam  General appearance - alert, well appearing, and in no distress Mental status - normal mood, behavior, speech, dress, motor activity, and thought processes Neck - supple, no significant adenopathy Chest - clear to auscultation, no wheezes, rales or rhonchi, symmetric air entry Heart - normal rate, regular rhythm, normal S1, S2, no murmurs, rubs, clicks or gallops Extremities - peripheral pulses normal, no pedal edema, no clubbing or cyanosis Diabetic Foot Exam - Simple   Simple Foot Form Diabetic Foot exam was performed with the following findings: Yes 12/14/2023 12:49 PM  Visual Inspection No deformities, no ulcerations, no other skin breakdown bilaterally: Yes Sensation Testing Intact to touch and monofilament testing bilaterally: Yes Pulse Check Posterior Tibialis and Dorsalis pulse intact bilaterally: Yes Comments 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         Latest  Ref Rng & Units 05/21/2023  4:30 PM 06/12/2022    9:28 AM 10/22/2021    4:51 PM  CMP  Glucose 70 - 99 mg/dL 892  89  869   BUN 8 - 27 mg/dL 11  15  12    Creatinine 0.57 - 1.00 mg/dL 8.92  8.93  8.97   Sodium 134 - 144 mmol/L 144  141  138   Potassium 3.5 - 5.2 mmol/L 4.5  4.5  3.8   Chloride 96 - 106 mmol/L 108  106  109   CO2 20 - 29 mmol/L 18  18  20    Calcium  8.7 - 10.3 mg/dL 9.4  9.7  9.1   Total Protein 6.0 - 8.5 g/dL 7.3  7.0  7.2   Total Bilirubin 0.0 - 1.2 mg/dL 0.4  0.3  1.1   Alkaline Phos 44 - 121 IU/L 101  87  66   AST 0 - 40 IU/L 18  17  19    ALT 0 - 32 IU/L 14  10  13     Lipid Panel     Component Value Date/Time   CHOL 140 05/21/2023 1630   TRIG 98 05/21/2023 1630   HDL 49 05/21/2023 1630   CHOLHDL 2.9 05/21/2023 1630   CHOLHDL 3.5 09/05/2016 0228   VLDL 22 09/05/2016 0228   LDLCALC 73 05/21/2023 1630    CBC    Component Value Date/Time   WBC 7.1 05/21/2023 1630   WBC 6.7 10/22/2021 1651   RBC 3.85 05/21/2023 1630   RBC 4.10 10/22/2021 1651   HGB 10.9 (L) 05/21/2023 1630   HCT 33.8 (L) 05/21/2023 1630   PLT 267 05/21/2023 1630   MCV 88 05/21/2023 1630   MCH 28.3 05/21/2023 1630   MCH 29.5 10/22/2021 1651   MCHC 32.2 05/21/2023 1630   MCHC 31.6 10/22/2021 1651   RDW 14.3 05/21/2023 1630   LYMPHSABS 2.6 04/22/2009 2257   MONOABS 0.6 04/22/2009 2257   EOSABS 0.1 04/22/2009 2257   BASOSABS 0.0 04/22/2009 2257    ASSESSMENT AND PLAN: 1. Type 2 diabetes mellitus with obesity (HCC) (Primary) Well-controlled.  Continue metformin  1 g daily.  Commended her on weight loss.  Encouraged to continue healthy eating habits and regular exercise. - POCT glycosylated hemoglobin (Hb A1C) - POCT glucose (manual entry) - Microalbumin / creatinine urine ratio  2. Diabetes mellitus treated with oral medication (HCC) See #1 above  3. Hypertension associated with diabetes (HCC) At goal.  Continue carvedilol  25 mg twice a day, isosorbide  20 mg 3 times a day,  hydralazine  50 mg 3 times a day - Basic Metabolic Panel  4. Hyperlipidemia associated with type 2 diabetes mellitus (HCC) Continue atorvastatin  10 mg daily  5. Stage 3a chronic kidney disease (HCC) Stable.  Will continue to observe  6. Coronary artery disease involving native coronary artery of native heart without angina pectoris Stable.  Continue isosorbide  3 times a day, atorvastatin , carvedilol  and aspirin   7. Primary osteoarthritis of both knees Encouraged her to continue regular exercise.  Weight loss is good.  She has tramadol  to use as needed.  9. Chronic bilateral low back pain, unspecified whether sciatica present Will give prescription for lidocaine  patches for her to use as needed when she has flareup.  Advised to avoid heavy lifting. - lidocaine  (LIDODERM ) 5 %; Place 1 patch onto the skin daily as needed. Remove & Discard patch within 12 hours or as directed by MD  Dispense: 30 patch; Refill: 0  10. Screening for colon cancer Due for  repeat colonoscopy.  Last one was done in 2015.  Reports no polyps were removed.  She prefers to do Cologuard test at this time - Cologuard     Patient was given the opportunity to ask questions.  Patient verbalized understanding of the plan and was able to repeat key elements of the plan.   This documentation was completed using Paediatric nurse.  Any transcriptional errors are unintentional.  Orders Placed This Encounter  Procedures   Microalbumin / creatinine urine ratio   Cologuard   Basic Metabolic Panel   POCT glycosylated hemoglobin (Hb A1C)   POCT glucose (manual entry)     Requested Prescriptions   Signed Prescriptions Disp Refills   triamcinolone  cream (KENALOG ) 0.1 % 60 g 5    Sig: APPLY TOPICALLY TWICE DAILY As needed.   lidocaine  (LIDODERM ) 5 % 30 patch 0    Sig: Place 1 patch onto the skin daily as needed. Remove & Discard patch within 12 hours or as directed by MD    Return in about 4 months  (around 04/15/2024).  Barnie Louder, MD, FACP

## 2023-12-15 ENCOUNTER — Telehealth: Payer: Self-pay | Admitting: Internal Medicine

## 2023-12-15 ENCOUNTER — Ambulatory Visit: Payer: Self-pay | Admitting: Internal Medicine

## 2023-12-15 DIAGNOSIS — E875 Hyperkalemia: Secondary | ICD-10-CM

## 2023-12-15 LAB — BASIC METABOLIC PANEL WITH GFR
BUN/Creatinine Ratio: 15 (ref 12–28)
BUN: 16 mg/dL (ref 8–27)
CO2: 16 mmol/L — ABNORMAL LOW (ref 20–29)
Calcium: 9.7 mg/dL (ref 8.7–10.3)
Chloride: 106 mmol/L (ref 96–106)
Creatinine, Ser: 1.08 mg/dL — ABNORMAL HIGH (ref 0.57–1.00)
Glucose: 116 mg/dL — ABNORMAL HIGH (ref 70–99)
Potassium: 5.3 mmol/L — ABNORMAL HIGH (ref 3.5–5.2)
Sodium: 140 mmol/L (ref 134–144)
eGFR: 54 mL/min/1.73 — ABNORMAL LOW (ref >59)

## 2023-12-15 NOTE — Telephone Encounter (Signed)
 PC placed to pt today.  I left the following VM message: Kidney function is not 100% but stable. Potassium level is mildly elevated.  Try to decrease consumption of potassium rich foods like bananas, oranges, orange juice, cantaloupe.  Please return to the lab in 1 to 2 weeks for recheck of potassium level.

## 2023-12-16 LAB — MICROALBUMIN / CREATININE URINE RATIO
Creatinine, Urine: 238.7 mg/dL
Microalb/Creat Ratio: 10 mg/g{creat} (ref 0–29)
Microalbumin, Urine: 23.2 ug/mL

## 2024-01-10 ENCOUNTER — Other Ambulatory Visit: Payer: Self-pay | Admitting: Internal Medicine

## 2024-01-10 DIAGNOSIS — M545 Low back pain, unspecified: Secondary | ICD-10-CM

## 2024-01-12 LAB — FECAL OCCULT BLOOD, IMMUNOCHEMICAL: IFOBT: NEGATIVE

## 2024-01-12 NOTE — Telephone Encounter (Signed)
 Labs in date.  Requested Prescriptions  Pending Prescriptions Disp Refills   lidocaine  (LIDODERM ) 5 % [Pharmacy Med Name: LIDOCAINE  5 % External Patch] 30 patch 11    Sig: PLACE 1 PATCH ONTO THE SKIN DAILY AS NEEDED. REMOVE AND DISCARD PATCH WITHIN 12 HOURS OR AS DIRECTED BY MD     Analgesics:  Topicals Failed - 01/12/2024  1:15 PM      Failed - Manual Review: Labs are only required if the patient has taken medication for more than 8 weeks.      Failed - HGB in normal range and within 360 days    Hemoglobin  Date Value Ref Range Status  05/21/2023 10.9 (L) 11.1 - 15.9 g/dL Final         Failed - HCT in normal range and within 360 days    Hematocrit  Date Value Ref Range Status  05/21/2023 33.8 (L) 34.0 - 46.6 % Final         Failed - Cr in normal range and within 360 days    Creat  Date Value Ref Range Status  08/03/2016 1.19 (H) 0.50 - 0.99 mg/dL Final    Comment:      For patients > or = 75 years of age: The upper reference limit for Creatinine is approximately 13% higher for people identified as African-American.      Creatinine, Ser  Date Value Ref Range Status  12/14/2023 1.08 (H) 0.57 - 1.00 mg/dL Final   Creatinine, Urine  Date Value Ref Range Status  07/09/2015 120 20 - 320 mg/dL Final         Passed - PLT in normal range and within 360 days    Platelets  Date Value Ref Range Status  05/21/2023 267 150 - 450 x10E3/uL Final         Passed - eGFR is 30 or above and within 360 days    GFR, Est African American  Date Value Ref Range Status  08/03/2016 55 (L) >=60 mL/min Final   GFR calc Af Amer  Date Value Ref Range Status  10/09/2019 58 (L) >59 mL/min/1.73 Final    Comment:    **[facility] currently reports eGFR in compliance with the current**   recommendations of the [employer]. [facility] will   update reporting as new guidelines are published from the NKF-ASN   Task force.    GFR, Est Non African American  Date Value Ref Range Status   08/03/2016 47 (L) >=60 mL/min Final   GFR, Estimated  Date Value Ref Range Status  10/22/2021 58 (L) >60 mL/min Final    Comment:    (NOTE) Calculated using the CKD-EPI Creatinine Equation (2021)    eGFR  Date Value Ref Range Status  12/14/2023 54 (L) >59 mL/min/1.73 Final         Passed - Patient is not pregnant      Passed - Valid encounter within last 12 months    Recent Outpatient Visits           4 weeks ago Type 2 diabetes mellitus with obesity (HCC)   [facility] [place]. [facility] Vicci Sober B, MD   7 months ago Type 2 diabetes mellitus with morbid obesity [facility])   [facility] Of [place]. [facility] Vicci Sober B, MD   1 year ago Type 2 diabetes mellitus with obesity [facility])     [facility] - [facility] Dept Of [place]. [facility] Vicci Barnie NOVAK, MD   1 year ago Hypertension associated with diabetes [facility])   [facility] Dept Of [place]. [facility] Fleeta Tonia Senior L, RPH-CPP   1 year ago Type 2 diabetes mellitus with obesity [facility])   [facility] - A Dept Of [place]. [facility] Vicci Barnie NOVAK, MD       Future Appointments             In 3 months Vicci Barnie NOVAK, MD [facility] Dept Of [place]. [facility]

## 2024-01-17 ENCOUNTER — Other Ambulatory Visit: Payer: Self-pay | Admitting: Internal Medicine

## 2024-01-17 DIAGNOSIS — I2510 Atherosclerotic heart disease of native coronary artery without angina pectoris: Secondary | ICD-10-CM

## 2024-01-17 DIAGNOSIS — E1159 Type 2 diabetes mellitus with other circulatory complications: Secondary | ICD-10-CM

## 2024-01-20 ENCOUNTER — Other Ambulatory Visit: Payer: Self-pay | Admitting: Internal Medicine

## 2024-01-20 DIAGNOSIS — M170 Bilateral primary osteoarthritis of knee: Secondary | ICD-10-CM

## 2024-01-25 ENCOUNTER — Ambulatory Visit: Payer: Medicare HMO

## 2024-02-16 ENCOUNTER — Ambulatory Visit: Payer: Self-pay | Admitting: Internal Medicine

## 2024-04-20 ENCOUNTER — Telehealth: Payer: Self-pay | Admitting: Internal Medicine

## 2024-04-20 NOTE — Telephone Encounter (Signed)
Confirmed appt for 11/14

## 2024-04-21 ENCOUNTER — Encounter: Payer: Self-pay | Admitting: Internal Medicine

## 2024-04-21 ENCOUNTER — Ambulatory Visit: Attending: Internal Medicine | Admitting: Internal Medicine

## 2024-04-21 VITALS — BP 111/65 | HR 88 | Temp 97.8°F | Ht 67.0 in | Wt 218.0 lb

## 2024-04-21 DIAGNOSIS — E875 Hyperkalemia: Secondary | ICD-10-CM

## 2024-04-21 DIAGNOSIS — N1831 Chronic kidney disease, stage 3a: Secondary | ICD-10-CM

## 2024-04-21 DIAGNOSIS — E1169 Type 2 diabetes mellitus with other specified complication: Secondary | ICD-10-CM | POA: Diagnosis not present

## 2024-04-21 DIAGNOSIS — E1159 Type 2 diabetes mellitus with other circulatory complications: Secondary | ICD-10-CM

## 2024-04-21 DIAGNOSIS — I152 Hypertension secondary to endocrine disorders: Secondary | ICD-10-CM

## 2024-04-21 DIAGNOSIS — Z6834 Body mass index (BMI) 34.0-34.9, adult: Secondary | ICD-10-CM

## 2024-04-21 DIAGNOSIS — E1122 Type 2 diabetes mellitus with diabetic chronic kidney disease: Secondary | ICD-10-CM

## 2024-04-21 DIAGNOSIS — Z23 Encounter for immunization: Secondary | ICD-10-CM

## 2024-04-21 DIAGNOSIS — I129 Hypertensive chronic kidney disease with stage 1 through stage 4 chronic kidney disease, or unspecified chronic kidney disease: Secondary | ICD-10-CM

## 2024-04-21 DIAGNOSIS — I2510 Atherosclerotic heart disease of native coronary artery without angina pectoris: Secondary | ICD-10-CM

## 2024-04-21 DIAGNOSIS — E119 Type 2 diabetes mellitus without complications: Secondary | ICD-10-CM

## 2024-04-21 DIAGNOSIS — E669 Obesity, unspecified: Secondary | ICD-10-CM | POA: Diagnosis not present

## 2024-04-21 DIAGNOSIS — J4520 Mild intermittent asthma, uncomplicated: Secondary | ICD-10-CM

## 2024-04-21 DIAGNOSIS — M170 Bilateral primary osteoarthritis of knee: Secondary | ICD-10-CM

## 2024-04-21 LAB — POCT GLYCOSYLATED HEMOGLOBIN (HGB A1C): HbA1c, POC (controlled diabetic range): 6.2 % (ref 0.0–7.0)

## 2024-04-21 LAB — GLUCOSE, POCT (MANUAL RESULT ENTRY): POC Glucose: 114 mg/dL — AB (ref 70–99)

## 2024-04-21 MED ORDER — ISOSORBIDE DINITRATE 20 MG PO TABS: 20.0000 mg | ORAL_TABLET | Freq: Three times a day (TID) | ORAL | 1 refills | Status: AC

## 2024-04-21 MED ORDER — HYDRALAZINE HCL 50 MG PO TABS: 50.0000 mg | ORAL_TABLET | Freq: Three times a day (TID) | ORAL | 3 refills | Status: AC

## 2024-04-21 MED ORDER — METFORMIN HCL 1000 MG PO TABS
1000.0000 mg | ORAL_TABLET | Freq: Every day | ORAL | 3 refills | Status: AC
Start: 2024-04-21 — End: ?

## 2024-04-21 MED ORDER — CARVEDILOL 25 MG PO TABS: 25.0000 mg | ORAL_TABLET | Freq: Two times a day (BID) | ORAL | 1 refills | Status: AC

## 2024-04-21 MED ORDER — ATORVASTATIN CALCIUM 10 MG PO TABS: 10.0000 mg | ORAL_TABLET | Freq: Every day | ORAL | 3 refills | Status: AC

## 2024-04-21 NOTE — Patient Instructions (Signed)
  VISIT SUMMARY: Today, you had a follow-up appointment to review your diabetes, hypertension, heart disease, and other health concerns. Your blood pressure is good, and your diabetes is well-controlled with an A1c of 6.2. We discussed your current medications, lifestyle habits, and recent lab results. You received a flu shot and we talked about getting a COVID booster.  YOUR PLAN: -TYPE 2 DIABETES MELLITUS: Your diabetes is well-controlled with an A1c of 6.2. This means your average blood sugar levels are within the target range. Continue taking metformin  1000 mg daily and monitor your blood sugars regularly, especially if fasting levels are in the 70s. Keep up with your healthy eating habits and regular exercise.  -ATHEROSCLEROTIC HEART DISEASE OF NATIVE CORONARY ARTERY: Your heart disease is well-managed with your current medications and you have no symptoms. Continue taking hydralazine  50 mg three times a day, isosorbide  20 mg three times a day, carvedilol  25 mg twice a day, atorvastatin  10 mg daily, and aspirin  as prescribed. Your prescriptions for these medications have been refilled.  -CHRONIC KIDNEY DISEASE, STAGE 3A: Your kidney function is stable with a GFR in the 50s, which means your kidneys are working at a moderate level. Continue to monitor your kidney function regularly. We rechecked your GFR and potassium levels today.  -HYPERKALEMIA: You had a previous elevated potassium level, which means there was too much potassium in your blood. We rechecked your potassium levels today to monitor this.  -BILATERAL PRIMARY OSTEOARTHRITIS OF KNEE: Your knee arthritis is managed with tramadol  as needed, and you have not used it recently. You can continue to use tramadol  for pain management if needed and consider using over-the-counter diclofenac  gel for topical pain relief.  Reactive Airway: continue albuterol  inhaler as needed.  -ENCOUNTER FOR IMMUNIZATION: You received a flu shot today. Due to  your chronic medical conditions, it is recommended that you also get a COVID booster.  INSTRUCTIONS: Please follow up with the lab results for your GFR and potassium levels. Continue with your current medications and lifestyle habits. Schedule your next eye exam for April of next year. Consider getting a COVID booster shot as recommended.                      Contains text generated by [employer]bridge.                                 Contains text generated by Abridge.

## 2024-04-21 NOTE — Progress Notes (Signed)
 Patient ID: Desiree Sanchez, female    DOB: [birth year]  MRN: [id number]  CC: Diabetes (DM f/u. Pharmacy change meds will ned to go to [facility] on [address]/No questions / concerns/Flu vax administered on 04/21/24 - C.A)   Subjective: Desiree Sanchez is a 73 y.o. female who presents for chronic ds management. Her concerns today include:  Pt with hx of HTN, HL, DM, CKD stage 3, insomnia non-obstructing CAD (cath 08/2016, for med management), RT CTS, OA knees, gout, IDA.   Discussed the use of AI scribe software for clinical note transcription with the patient, who gave verbal consent to proceed.  History of Present Illness Desiree Sanchez is a 73 year old female with diabetes, hypertension, and heart disease who presents for a four-month follow-up.  She recently switched her insurance from [employer] to [employer], which conducted a home visit earlier this week. During the visit, her blood pressure was noted to be good, and she is up to date with her eye exams, with the next one due in April of the following year.  DM: Results for orders placed or performed in visit on 04/21/24  POCT glucose (manual entry)   Collection Time: 04/21/24  8:46 AM  Result Value Ref Range   POC Glucose 114 (A) 70 - 99 mg/dl  POCT glycosylated hemoglobin (Hb A1C)   Collection Time: 04/21/24  8:48 AM  Result Value Ref Range   Hemoglobin A1C     HbA1c POC (<> result, manual entry)     HbA1c, POC (prediabetic range)     HbA1c, POC (controlled diabetic range) 6.2 0.0 - 7.0 %  Her diabetes management includes an A1c of 6.2 today, slightly up from 6.1 at the last visit, but still within the target range of less than 7. She continues to take metformin  1000 mg daily. She checks her blood sugar once a week, with recent readings of 72 mg/dL fasting and 821 mg/dL postprandial. Her fasting blood sugars usually range between 90 and 100 mg/dL, though she recently had a reading of 70 mg/dL. In terms of lifestyle,  she maintains a diet including fruits, yogurt, and vegetables, and tries to cut back on fried foods. She has not been attending the [facility] regularly but performs home stretching exercises every morning. Her weight has remained stable at 218 pounds, down slightly from 219 pounds four months ago.  HTN/CAD:  she is on hydralazine  50 mg three times a day, isosorbide  20 mg three times a day, carvedilol  25 mg twice a day, atorvastatin  10 mg daily, and aspirin . No chest pain, shortness of breath, or leg swelling.   CKD 3a: She has been told her kidney function, monitored through GFR, has been in the fifties, and she was noted to have an elevated potassium level in July, which she has not yet rechecked.  OA knees: She manages arthritis in her knee with tramadol  as needed, though she has not taken it in months. She reports no side effects from tramadol  and finds it effective when used. She also uses diclofenac  gel, which she now has to purchase over the counter.  She continues to use albuterol  inhaler only when needed to.  She occasionally wakes up with cough and a a little wheezing in her chest which is relieved with the albuterol  inhaler.    Patient Active Problem List   Diagnosis Date Noted   Hypertension associated with stage 3a chronic kidney disease due to type 2 diabetes mellitus (HCC) 10/07/2020  Hyperlipidemia associated with type 2 diabetes mellitus (HCC) 10/07/2020   Arthritis of finger of left hand 08/05/2018   Primary osteoarthritis of both knees 10/29/2017   CAD (coronary artery disease) 02/08/2017   Insomnia 11/06/2016   Iron deficiency anemia 09/14/2016   Shoulder pain, left 02/11/2016   Right carpal tunnel syndrome 07/29/2014   Vitamin D  insufficiency 04/13/2014   Obesity (BMI 30-39.9) 03/13/2013   Gout 05/20/2009   HEART MURMUR, SYSTOLIC 05/06/2009   DM2 (diabetes mellitus, type 2) (HCC) 04/22/2009   Essential hypertension 04/22/2009     Current Outpatient Medications on File  Prior to Visit  Medication Sig Dispense Refill   Triamcinolone  Acetonide (TRIAMCINOLONE  0.1 % CREAM : EUCERIN) CREA Apply 1 application topically 2 (two) times daily as needed. Please mix 1:1 (Patient taking differently: Apply 1 application  topically 2 (two) times daily as needed. Please mix 1:1) 1 each 3   albuterol  (VENTOLIN  HFA) 108 (90 Base) MCG/ACT inhaler INHALE 2 PUFFS INTO THE LUNGS EVERY 6 HOURS AS NEEDED FOR WHEEZING OR SHORTNESS OF BREATH. 3 each 6   aspirin  EC 81 MG tablet Take 1 tablet (81 mg total) by mouth daily. 90 tablet 3   Blood Glucose Monitoring Suppl (ONETOUCH VERIO) w/Device KIT Use to check blood sugar once daily. 1 kit 0   Blood Pressure KIT 1 each by Does not apply route daily. 1 each 0   diclofenac  Sodium (VOLTAREN ) 1 % GEL APPLY 2 GRAMS TOPICALLY FOUR TIMES DAILY 200 g 3   gabapentin  (NEURONTIN ) 100 MG capsule TAKE 3 CAPSULES AT BEDTIME 270 capsule 1   indomethacin  (INDOCIN ) 50 MG capsule TAKE 1 CAPSULE TWICE DAILY AS NEEDED FOR ACUTE GOUT ATTACKS 60 capsule 0   lidocaine  (LIDODERM ) 5 % PLACE 1 PATCH ONTO THE SKIN DAILY AS NEEDED. REMOVE AND DISCARD PATCH WITHIN 12 HOURS OR AS DIRECTED BY MD 30 patch 11   loratadine  (CLARITIN ) 10 MG tablet Take 1 tablet (10 mg total) by mouth daily as needed for allergies. 90 tablet 2   traMADol  (ULTRAM ) 50 MG tablet TAKE 1 TABLET DAILY AS NEEDED 30 tablet 0   triamcinolone  cream (KENALOG ) 0.1 % APPLY TOPICALLY TWICE DAILY As needed. 60 g 5   TRUE METRIX BLOOD GLUCOSE TEST test strip TEST BLOOD SUGAR EVERY DAY 100 strip 3   TRUEplus Lancets 33G MISC TEST BLOOD SUGAR EVERY DAY 100 each 3   No current facility-administered medications on file prior to visit.    Allergies  Allergen Reactions   Lisinopril Swelling    REACTION: Swolen lips (only)   Hctz [Hydrochlorothiazide ] Other (See Comments)    Joint pain, hyperuricemia w/ hx of gout   Naproxen Hives, Itching and Palpitations   Norvasc  [Amlodipine  Besylate] Other (See Comments)     Peripheral edema     Social History   Socioeconomic History   Marital status: Single    Spouse name: Not on file   Number of children: Not on file   Years of education: Not on file   Highest education level: Not on file  Occupational History   Not on file  Tobacco Use   Smoking status: Former    Current packs/day: 0.00    Types: Cigarettes    Quit date: 2009    Years since quitting: 16.8   Smokeless tobacco: Never  Substance and Sexual Activity   Alcohol use: Not Currently    Comment: occasionally   Drug use: No   Sexual activity: Not Currently  Other Topics Concern  Not on file  Social History Narrative   Not on file   Social Drivers of Health   Financial Resource Strain: Low Risk  (12/14/2023)   Overall Financial Resource Strain (CARDIA)    Difficulty of Paying Living Expenses: Not hard at all  Food Insecurity: No Food Insecurity (12/14/2023)   Hunger Vital Sign    Worried About Running Out of Food in the Last Year: Never true    Ran Out of Food in the Last Year: Never true  Transportation Needs: No Transportation Needs (12/14/2023)   PRAPARE - Administrator, Civil Service (Medical): No    Lack of Transportation (Non-Medical): No  Physical Activity: Insufficiently Active (12/14/2023)   Exercise Vital Sign    Days of Exercise per Week: 3 days    Minutes of Exercise per Session: 20 min  Stress: No Stress Concern Present (12/14/2023)   [employer] of Occupational Health - Occupational Stress Questionnaire    Feeling of Stress: Not at all  Social Connections: Moderately Integrated (12/14/2023)   Social Connection and Isolation Panel    Frequency of Communication with Friends and Family: More than three times a week    Frequency of Social Gatherings with Friends and Family: More than three times a week    Attends Religious Services: More than 4 times per year    Active Member of Clubs or Organizations: Yes    Attends Banker Meetings: More  than 4 times per year    Marital Status: Divorced  Intimate Partner Violence: Not At Risk (12/14/2023)   Humiliation, Afraid, Rape, and Kick questionnaire    Fear of Current or Ex-Partner: No    Emotionally Abused: No    Physically Abused: No    Sexually Abused: No    Family History  Problem Relation Age of Onset   Diabetes Mother    Diabetes Sister     Past Surgical History:  Procedure Laterality Date   APPENDECTOMY     CORONARY PRESSURE/FFR STUDY N/A 09/07/2016   Procedure: Intravascular Pressure Wire/FFR Study;  Surgeon: Lonni Hanson, MD;  Location: [facility] INVASIVE CV LAB;  Service: Cardiovascular;  Laterality: N/A;   LEFT HEART CATH AND CORONARY ANGIOGRAPHY N/A 09/07/2016   Procedure: Left Heart Cath and Coronary Angiography;  Surgeon: Lonni Hanson, MD;  Location: [facility] INVASIVE CV LAB;  Service: Cardiovascular;  Laterality: N/A;   TUBAL LIGATION      ROS: Review of Systems Negative except as stated above  PHYSICAL EXAM: BP 111/65 (BP Location: Left Arm, Patient Position: Sitting, Cuff Size: Normal)   Pulse 88   Temp 97.8 F (36.6 C) (Oral)   Ht 5' 7 (1.702 m)   Wt 218 lb (98.9 kg)   SpO2 98%   BMI 34.14 kg/m   Wt Readings from Last 3 Encounters:  04/21/24 218 lb (98.9 kg)  12/14/23 219 lb (99.3 kg)  05/21/23 231 lb (104.8 kg)    Physical Exam  General appearance - alert, well appearing, elderly AAF and in no distress Mental status - normal mood, behavior, speech, dress, motor activity, and thought processes Neck - supple, no significant adenopathy Chest - clear to auscultation, no wheezes, rales or rhonchi, symmetric air entry Heart - normal rate, regular rhythm, normal S1, S2, no murmurs, rubs, clicks or gallops Extremities - peripheral pulses normal, no pedal edema, no clubbing or cyanosis      Latest Ref Rng & Units 12/14/2023   12:30 PM 05/21/2023    4:30 PM 06/12/2022  9:28 AM  CMP  Glucose 70 - 99 mg/dL 883  892  89   BUN 8 - 27 mg/dL 16  11  15     Creatinine 0.57 - 1.00 mg/dL 8.91  8.92  8.93   Sodium 134 - 144 mmol/L 140  144  141   Potassium 3.5 - 5.2 mmol/L 5.3  4.5  4.5   Chloride 96 - 106 mmol/L 106  108  106   CO2 20 - 29 mmol/L 16  18  18    Calcium  8.7 - 10.3 mg/dL 9.7  9.4  9.7   Total Protein 6.0 - 8.5 g/dL  7.3  7.0   Total Bilirubin 0.0 - 1.2 mg/dL  0.4  0.3   Alkaline Phos 44 - 121 IU/L  101  87   AST 0 - 40 IU/L  18  17   ALT 0 - 32 IU/L  14  10    Lipid Panel     Component Value Date/Time   CHOL 140 05/21/2023 1630   TRIG 98 05/21/2023 1630   HDL 49 05/21/2023 1630   CHOLHDL 2.9 05/21/2023 1630   CHOLHDL 3.5 09/05/2016 0228   VLDL 22 09/05/2016 0228   LDLCALC 73 05/21/2023 1630    CBC    Component Value Date/Time   WBC 7.1 05/21/2023 1630   WBC 6.7 10/22/2021 1651   RBC 3.85 05/21/2023 1630   RBC 4.10 10/22/2021 1651   HGB 10.9 (L) 05/21/2023 1630   HCT 33.8 (L) 05/21/2023 1630   PLT 267 05/21/2023 1630   MCV 88 05/21/2023 1630   MCH 28.3 05/21/2023 1630   MCH 29.5 10/22/2021 1651   MCHC 32.2 05/21/2023 1630   MCHC 31.6 10/22/2021 1651   RDW 14.3 05/21/2023 1630   LYMPHSABS 2.6 04/22/2009 2257   MONOABS 0.6 04/22/2009 2257   EOSABS 0.1 04/22/2009 2257   BASOSABS 0.0 04/22/2009 2257    ASSESSMENT AND PLAN: 1. Type 2 diabetes mellitus in patient with obesity (HCC) (Primary) Well-controlled with A1c of 6.2. Blood sugars stable,  - Continue metformin  1000 mg daily. - Goal to keep fasting BS b/w 90-130. Let me know if dropping below 80 frequently - Encouraged continuation of healthy eating habits and regular exercise. - POCT glucose (manual entry) - POCT glycosylated hemoglobin (Hb A1C) - metFORMIN  (GLUCOPHAGE ) 1000 MG tablet; Take 1 tablet (1,000 mg total) by mouth daily with breakfast.  Dispense: 90 tablet; Refill: 3  2. Diabetes mellitus treated with oral medication (HCC) See above  3. Coronary artery disease involving native coronary artery of native heart without angina  pectoris Well-managed with no symptoms. Continue medications include isosorbide , carvedilol , atorvastatin , and aspirin . - Continue isosorbide  20 mg three times a day. - Continue carvedilol  25 mg twice a day. - Continue atorvastatin  10 mg daily. - Continue aspirin  as prescribed. - atorvastatin  (LIPITOR) 10 MG tablet; Take 1 tablet (10 mg total) by mouth daily.  Dispense: 90 tablet; Refill: 3 - carvedilol  (COREG ) 25 MG tablet; Take 1 tablet (25 mg total) by mouth 2 (two) times daily with a meal.  Dispense: 180 tablet; Refill: 1 - isosorbide  dinitrate (ISORDIL ) 20 MG tablet; Take 1 tablet (20 mg total) by mouth 3 (three) times daily.  Dispense: 270 tablet; Refill: 1  4. Hypertension associated with diabetes (HCC) Controlled.  - Continue hydralazine  50 mg three times a day. - Continue isosorbide  20 mg three times a day. - Continue carvedilol  25 mg twice a day. - atorvastatin  (LIPITOR) 10 MG tablet; Take  1 tablet (10 mg total) by mouth daily.  Dispense: 90 tablet; Refill: 3 - carvedilol  (COREG ) 25 MG tablet; Take 1 tablet (25 mg total) by mouth 2 (two) times daily with a meal.  Dispense: 180 tablet; Refill: 1 - hydrALAZINE  (APRESOLINE ) 50 MG tablet; Take 1 tablet (50 mg total) by mouth 3 (three) times daily.  Dispense: 270 tablet; Refill: 3  5. Stage 3a chronic kidney disease (HCC) Stable GFRs in 50s - Basic Metabolic Panel  6. Hyperkalemia - Basic Metabolic Panel  7. Primary osteoarthritis of both knees Stable.  Continue Tramadol  PRN We updated Control Subst Prescribing Agreement with her today  8. Mild intermittent reactive airway disease without complication Continue Albuterol  PRN   9. Need for influenza vaccination Given today   Patient was given the opportunity to ask questions.  Patient verbalized understanding of the plan and was able to repeat key elements of the plan.   This documentation was completed using Paediatric nurse.  Any transcriptional errors  are unintentional.  Orders Placed This Encounter  Procedures   Flu vaccine HIGH DOSE PF(Fluzone Trivalent)   Basic Metabolic Panel   POCT glucose (manual entry)   POCT glycosylated hemoglobin (Hb A1C)     Requested Prescriptions   Signed Prescriptions Disp Refills   atorvastatin  (LIPITOR) 10 MG tablet 90 tablet 3    Sig: Take 1 tablet (10 mg total) by mouth daily.   carvedilol  (COREG ) 25 MG tablet 180 tablet 1    Sig: Take 1 tablet (25 mg total) by mouth 2 (two) times daily with a meal.   metFORMIN  (GLUCOPHAGE ) 1000 MG tablet 90 tablet 3    Sig: Take 1 tablet (1,000 mg total) by mouth daily with breakfast.   hydrALAZINE  (APRESOLINE ) 50 MG tablet 270 tablet 3    Sig: Take 1 tablet (50 mg total) by mouth 3 (three) times daily.   isosorbide  dinitrate (ISORDIL ) 20 MG tablet 270 tablet 1    Sig: Take 1 tablet (20 mg total) by mouth 3 (three) times daily.    Return in about 4 months (around 08/19/2024) for chronic ds management.  Barnie Louder, MD, FACP

## 2024-04-22 ENCOUNTER — Ambulatory Visit: Payer: Self-pay | Admitting: Internal Medicine

## 2024-04-22 LAB — BASIC METABOLIC PANEL WITH GFR
BUN/Creatinine Ratio: 17 (ref 12–28)
BUN: 18 mg/dL (ref 8–27)
CO2: 16 mmol/L — ABNORMAL LOW (ref 20–29)
Calcium: 9.3 mg/dL (ref 8.7–10.3)
Chloride: 111 mmol/L — ABNORMAL HIGH (ref 96–106)
Creatinine, Ser: 1.05 mg/dL — ABNORMAL HIGH (ref 0.57–1.00)
Glucose: 110 mg/dL — ABNORMAL HIGH (ref 70–99)
Potassium: 4.7 mmol/L (ref 3.5–5.2)
Sodium: 140 mmol/L (ref 134–144)
eGFR: 55 mL/min/1.73 — ABNORMAL LOW (ref >59)

## 2024-06-27 ENCOUNTER — Ambulatory Visit: Attending: Internal Medicine

## 2024-06-27 VITALS — Ht 67.0 in | Wt 210.0 lb

## 2024-06-27 DIAGNOSIS — Z0000 Encounter for general adult medical examination without abnormal findings: Secondary | ICD-10-CM

## 2024-06-27 NOTE — Patient Instructions (Signed)
 Desiree Sanchez,  Thank you for taking the time for your Medicare Wellness Visit. I appreciate your continued commitment to your health goals. Please review the care plan we discussed, and feel free to reach out if I can assist you further.  Please note that Annual Wellness Visits do not include a physical exam. Some assessments may be limited, especially if the visit was conducted virtually. If needed, we may recommend an in-person follow-up with your provider.  Ongoing Care Seeing your primary care provider every 3 to 6 months helps us  monitor your health and provide consistent, personalized care.   Referrals If a referral was made during today's visit and you haven't received any updates within two weeks, please contact the referred provider directly to check on the status.  Recommended Screenings:  Health Maintenance  Topic Date Due   Medicare Annual Wellness Visit  01/19/2024   COVID-19 Vaccine (6 - 2025-26 season) 02/07/2024   Kidney health urinalysis for diabetes  06/15/2024   Eye exam for diabetics  09/13/2024   Hemoglobin A1C  10/19/2024   Complete foot exam   12/13/2024   Stool Blood Test  01/16/2025   Yearly kidney function blood test for diabetes  04/21/2025   DTaP/Tdap/Td vaccine (3 - Td or Tdap) 01/21/2030   Pneumococcal Vaccine for age over 20  Completed   Flu Shot  Completed   Osteoporosis screening with Bone Density Scan  Completed   Hepatitis C Screening  Completed   Zoster (Shingles) Vaccine  Completed   Meningitis B Vaccine  Aged Out   Breast Cancer Screening  Discontinued   Colon Cancer Screening  Discontinued       06/27/2024    2:15 PM  Advanced Directives  Does Patient Have a Medical Advance Directive? No  Would patient like information on creating a medical advance directive? No - Patient declined    Vision: Annual vision screenings are recommended for early detection of glaucoma, cataracts, and diabetic retinopathy. These exams can also reveal signs of  chronic conditions such as diabetes and high blood pressure.  Dental: Annual dental screenings help detect early signs of oral cancer, gum disease, and other conditions linked to overall health, including heart disease and diabetes.  Please see the attached documents for additional preventive care recommendations.

## 2024-06-27 NOTE — Progress Notes (Signed)
 "  Chief Complaint  Patient presents with   Medicare Wellness    Subsequent     Subjective:   Desiree Sanchez is a 74 y.o. female who presents for a Medicare Annual Wellness Visit.  Visit info / Clinical Intake: Medicare Wellness Visit Type:: Subsequent Annual Wellness Visit Persons participating in visit and providing information:: patient Medicare Wellness Visit Mode:: Telephone If telephone:: video declined Since this visit was completed virtually, some vitals may be partially provided or unavailable. Missing vitals are due to the limitations of the virtual format.: Documented vitals are patient reported If Telephone or Video please confirm:: I connected with patient using audio/video enable telemedicine. I verified patient identity with two identifiers, discussed telehealth limitations, and patient agreed to proceed. Patient Location:: home Provider Location:: office Interpreter Needed?: No Pre-visit prep was completed: yes AWV questionnaire completed by patient prior to visit?: no Living arrangements:: (!) lives alone Patient's Overall Health Status Rating: very good Typical amount of pain: none Does pain affect daily life?: no Are you currently prescribed opioids?: no  Dietary Habits and Nutritional Risks How many meals a day?: 3 Eats fruit and vegetables daily?: yes Most meals are obtained by: preparing own meals In the last 2 weeks, have you had any of the following?: none Diabetic:: (!) yes Any non-healing wounds?: no How often do you check your BS?: as needed (1-2 times per week) Would you like to be referred to a Nutritionist or for Diabetic Management? : no  Functional Status Activities of Daily Living (to include ambulation/medication): Independent Ambulation: Independent Medication Administration: Independent Home Management (perform basic housework or laundry): Independent Manage your own finances?: yes Primary transportation is: driving Concerns about  vision?: no *vision screening is required for WTM* Concerns about hearing?: no  Fall Screening Falls in the past year?: 0 Number of falls in past year: 0 Was there an injury with Fall?: 0 Fall Risk Category Calculator: 0 Patient Fall Risk Level: Low Fall Risk  Fall Risk Patient at Risk for Falls Due to: No Fall Risks Fall risk Follow up: Falls evaluation completed; Education provided  Home and Transportation Safety: All rugs have non-skid backing?: yes All stairs or steps have railings?: N/A, no stairs Grab bars in the bathtub or shower?: (!) no Have non-skid surface in bathtub or shower?: yes (mat) Good home lighting?: yes Regular seat belt use?: yes Hospital stays in the last year:: no  Cognitive Assessment Difficulty concentrating, remembering, or making decisions? : no Will 6CIT or Mini Cog be Completed: yes What year is it?: 0 points What month is it?: 0 points Give patient an address phrase to remember (5 components): [address] [place] About what time is it?: 0 points Count backwards from 20 to 1: 0 points Say the months of the year in reverse: 0 points Repeat the address phrase from earlier: 0 points 6 CIT Score: 0 points  Advance Directives (For Healthcare) Does Patient Have a Medical Advance Directive?: No Would patient like information on creating a medical advance directive?: No - Patient declined  Reviewed/Updated  Reviewed/Updated: Reviewed All (Medical, Surgical, Family, Medications, Allergies, Care Teams, Patient Goals)    Allergies (verified) Lisinopril, Hctz [hydrochlorothiazide ], Naproxen, and Norvasc  [amlodipine  besylate]   Current Medications (verified) Outpatient Encounter Medications as of 06/27/2024  Medication Sig   albuterol  (VENTOLIN  HFA) 108 (90 Base) MCG/ACT inhaler INHALE 2 PUFFS INTO THE LUNGS EVERY 6 HOURS AS NEEDED FOR WHEEZING OR SHORTNESS OF BREATH.   aspirin  EC 81 MG tablet  Take 1 tablet (81 mg total) by mouth daily.    atorvastatin  (LIPITOR) 10 MG tablet Take 1 tablet (10 mg total) by mouth daily.   Blood Glucose Monitoring Suppl (ONETOUCH VERIO) w/Device KIT Use to check blood sugar once daily.   Blood Pressure KIT 1 each by Does not apply route daily.   carvedilol  (COREG ) 25 MG tablet Take 1 tablet (25 mg total) by mouth 2 (two) times daily with a meal.   diclofenac  Sodium (VOLTAREN ) 1 % GEL APPLY 2 GRAMS TOPICALLY FOUR TIMES DAILY   gabapentin  (NEURONTIN ) 100 MG capsule TAKE 3 CAPSULES AT BEDTIME   hydrALAZINE  (APRESOLINE ) 50 MG tablet Take 1 tablet (50 mg total) by mouth 3 (three) times daily.   indomethacin  (INDOCIN ) 50 MG capsule TAKE 1 CAPSULE TWICE DAILY AS NEEDED FOR ACUTE GOUT ATTACKS   isosorbide  dinitrate (ISORDIL ) 20 MG tablet Take 1 tablet (20 mg total) by mouth 3 (three) times daily.   lidocaine  (LIDODERM ) 5 % PLACE 1 PATCH ONTO THE SKIN DAILY AS NEEDED. REMOVE AND DISCARD PATCH WITHIN 12 HOURS OR AS DIRECTED BY MD   loratadine  (CLARITIN ) 10 MG tablet Take 1 tablet (10 mg total) by mouth daily as needed for allergies.   metFORMIN  (GLUCOPHAGE ) 1000 MG tablet Take 1 tablet (1,000 mg total) by mouth daily with breakfast.   traMADol  (ULTRAM ) 50 MG tablet TAKE 1 TABLET DAILY AS NEEDED   Triamcinolone  Acetonide (TRIAMCINOLONE  0.1 % CREAM : EUCERIN) CREA Apply 1 application topically 2 (two) times daily as needed. Please mix 1:1 (Patient taking differently: Apply 1 application  topically 2 (two) times daily as needed. Please mix 1:1)   triamcinolone  cream (KENALOG ) 0.1 % APPLY TOPICALLY TWICE DAILY As needed.   TRUE METRIX BLOOD GLUCOSE TEST test strip TEST BLOOD SUGAR EVERY DAY   TRUEplus Lancets 33G MISC TEST BLOOD SUGAR EVERY DAY   No facility-administered encounter medications on file as of 06/27/2024.    History: Past Medical History:  Diagnosis Date   Arthritis Dx 2006   Carpal tunnel syndrome    Diabetes mellitus without complication (HCC) Dx 2005   Dyspnea    Gout    Heart murmur     Hypertension Dx 2005   Unstable angina (HCC) 08/2016   Past Surgical History:  Procedure Laterality Date   APPENDECTOMY     CORONARY PRESSURE/FFR STUDY N/A 09/07/2016   Procedure: Intravascular Pressure Wire/FFR Study;  Surgeon: Lonni Hanson, MD;  Location: [facility] INVASIVE CV LAB;  Service: Cardiovascular;  Laterality: N/A;   LEFT HEART CATH AND CORONARY ANGIOGRAPHY N/A 09/07/2016   Procedure: Left Heart Cath and Coronary Angiography;  Surgeon: Lonni Hanson, MD;  Location: [facility] INVASIVE CV LAB;  Service: Cardiovascular;  Laterality: N/A;   TUBAL LIGATION     Family History  Problem Relation Age of Onset   Diabetes Mother    Diabetes Sister    Social History   Occupational History   Not on file  Tobacco Use   Smoking status: Former    Current packs/day: 0.00    Types: Cigarettes    Quit date: 2009    Years since quitting: 17.0   Smokeless tobacco: Never  Substance and Sexual Activity   Alcohol use: Not Currently    Comment: occasionally   Drug use: No   Sexual activity: Not Currently   Tobacco Counseling Counseling given: Not Answered  SDOH Screenings   Food Insecurity: No Food Insecurity (06/27/2024)  Housing: Low Risk (06/27/2024)  Transportation Needs: No Transportation Needs (06/27/2024)  Utilities: Not At Risk (06/27/2024)  Alcohol Screen: Low Risk (06/27/2024)  Depression (PHQ2-9): Low Risk (06/27/2024)  Financial Resource Strain: Low Risk (06/27/2024)  Physical Activity: Sufficiently Active (06/27/2024)  Social Connections: Moderately Integrated (06/27/2024)  Stress: No Stress Concern Present (06/27/2024)  Tobacco Use: Medium Risk (06/27/2024)  Health Literacy: Adequate Health Literacy (06/27/2024)   See flowsheets for full screening details  Depression Screen PHQ 2 & 9 Depression Scale- Over the past 2 weeks, how often have you been bothered by any of the following problems? Little interest or pleasure in doing things: 0 Feeling down, depressed, or hopeless (PHQ  Adolescent also includes...irritable): 0 PHQ-2 Total Score: 0 Trouble falling or staying asleep, or sleeping too much: 0 Feeling tired or having little energy: 0 Poor appetite or overeating (PHQ Adolescent also includes...weight loss): 0 Feeling bad about yourself - or that you are a failure or have let yourself or your family down: 0 Trouble concentrating on things, such as reading the newspaper or watching television (PHQ Adolescent also includes...like school work): 0 Moving or speaking so slowly that other people could have noticed. Or the opposite - being so fidgety or restless that you have been moving around a lot more than usual: 0 Thoughts that you would be better off dead, or of hurting yourself in some way: 0 PHQ-9 Total Score: 0 If you checked off any problems, how difficult have these problems made it for you to do your work, take care of things at home, or get along with other people?: Not difficult at all  Depression Treatment Depression Interventions/Treatment : [id number] Score <4 Follow-up Not Indicated     Goals Addressed             This Visit's Progress    06/27/2024: My goal for 2026 is to get back into the gym and stay active.               Objective:    Today's Vitals   06/27/24 1412  Weight: 210 lb (95.3 kg)  Height: 5' 7 (1.702 m)  PainSc: 0-No pain   Body mass index is 32.89 kg/m.  Hearing/Vision screen Vision Screening - Comments:: Wears rx glasses - up to date with routine eye exams with Elspeth Pinal, OD.  Immunizations and Health Maintenance Health Maintenance  Topic Date Due   COVID-19 Vaccine (6 - 2025-26 season) 02/07/2024   Diabetic kidney evaluation - Urine ACR  06/15/2024   OPHTHALMOLOGY EXAM  09/13/2024   HEMOGLOBIN A1C  10/19/2024   FOOT EXAM  12/13/2024   COLON CANCER SCREENING ANNUAL FOBT  01/16/2025   Diabetic kidney evaluation - eGFR measurement  04/21/2025   Medicare Annual Wellness (AWV)  06/27/2025   DTaP/Tdap/Td (3 - Td  or Tdap) 01/21/2030   Pneumococcal Vaccine: 50+ Years  Completed   Influenza Vaccine  Completed   Bone Density Scan  Completed   Hepatitis C Screening  Completed   Zoster Vaccines- Shingrix  Completed   Meningococcal B Vaccine  Aged Out   Mammogram  Discontinued   Colonoscopy  Discontinued        Assessment/Plan:  This is a routine wellness examination for [place].  Patient Care Team: Vicci Barnie NOVAK, MD as PCP - General (Internal Medicine) Jordan, Peter M, MD as PCP - Cardiology (Cardiology) Pinal Elspeth CROME, OD as Consulting Physician (Optometry)  I have personally reviewed and noted the following in the patients chart:   Medical and social history Use of alcohol, tobacco or illicit drugs  Current medications and supplements including opioid prescriptions. Functional ability and status Nutritional status Physical activity Advanced directives List of other physicians Hospitalizations, surgeries, and ER visits in previous 12 months Vitals Screenings to include cognitive, depression, and falls Referrals and appointments  No orders of the defined types were placed in this encounter.  In addition, I have reviewed and discussed with patient certain preventive protocols, quality metrics, and best practice recommendations. A written personalized care plan for preventive services as well as general preventive health recommendations were provided to patient.   Roz LOISE Fuller, LPN   [id number]   Return in about 1 year (around 06/27/2025) for Medicare wellness.  After Visit Summary: (MyChart) Due to this being a telephonic visit, the after visit summary with patients personalized plan was offered to patient via MyChart   Nurse Notes: Patient aware of current care gaps.   "

## 2024-08-21 ENCOUNTER — Ambulatory Visit: Admitting: Internal Medicine
